# Patient Record
Sex: Female | Born: 1955 | Race: White | Hispanic: No | Marital: Single | Smoking: Former smoker
Health system: Southern US, Community
[De-identification: ages and names within clinical notes are randomized; demographics above are authoritative.]

## PROBLEM LIST (undated history)

## (undated) DIAGNOSIS — Z7902 Long term (current) use of antithrombotics/antiplatelets: Secondary | ICD-10-CM

## (undated) DIAGNOSIS — J441 Chronic obstructive pulmonary disease with (acute) exacerbation: Secondary | ICD-10-CM

## (undated) DIAGNOSIS — M810 Age-related osteoporosis without current pathological fracture: Secondary | ICD-10-CM

## (undated) DIAGNOSIS — N200 Calculus of kidney: Secondary | ICD-10-CM

## (undated) DIAGNOSIS — I10 Essential (primary) hypertension: Secondary | ICD-10-CM

## (undated) DIAGNOSIS — Z87442 Personal history of urinary calculi: Secondary | ICD-10-CM

## (undated) DIAGNOSIS — I779 Disorder of arteries and arterioles, unspecified: Secondary | ICD-10-CM

## (undated) DIAGNOSIS — F419 Anxiety disorder, unspecified: Secondary | ICD-10-CM

## (undated) DIAGNOSIS — Z9889 Other specified postprocedural states: Secondary | ICD-10-CM

## (undated) DIAGNOSIS — F32A Depression, unspecified: Secondary | ICD-10-CM

## (undated) DIAGNOSIS — R112 Nausea with vomiting, unspecified: Secondary | ICD-10-CM

## (undated) DIAGNOSIS — I341 Nonrheumatic mitral (valve) prolapse: Secondary | ICD-10-CM

## (undated) DIAGNOSIS — T7840XA Allergy, unspecified, initial encounter: Secondary | ICD-10-CM

## (undated) DIAGNOSIS — E782 Mixed hyperlipidemia: Secondary | ICD-10-CM

## (undated) DIAGNOSIS — M1990 Unspecified osteoarthritis, unspecified site: Secondary | ICD-10-CM

## (undated) HISTORY — PX: TONSILLECTOMY: SUR1361

## (undated) HISTORY — DX: Unspecified osteoarthritis, unspecified site: M19.90

## (undated) HISTORY — PX: CYSTOSCOPY WITH HOLMIUM LASER LITHOTRIPSY: SHX6639

## (undated) HISTORY — DX: Allergy, unspecified, initial encounter: T78.40XA

## (undated) HISTORY — PX: LITHOTRIPSY: SUR834

## (undated) HISTORY — PX: KIDNEY CYST REMOVAL: SHX684

## (undated) HISTORY — PX: STRABISMUS SURGERY: SHX218

---

## 1999-03-22 ENCOUNTER — Other Ambulatory Visit: Admission: RE | Admit: 1999-03-22 | Discharge: 1999-03-22 | Payer: Self-pay | Admitting: Family Medicine

## 2003-10-28 ENCOUNTER — Other Ambulatory Visit: Admission: RE | Admit: 2003-10-28 | Discharge: 2003-10-28 | Payer: Self-pay | Admitting: Internal Medicine

## 2013-12-19 DIAGNOSIS — K219 Gastro-esophageal reflux disease without esophagitis: Secondary | ICD-10-CM

## 2013-12-19 HISTORY — DX: Gastro-esophageal reflux disease without esophagitis: K21.9

## 2016-04-20 ENCOUNTER — Emergency Department
Admission: EM | Admit: 2016-04-20 | Discharge: 2016-04-20 | Disposition: A | Discharge status: Home / Self Care | Payer: No Typology Code available for payment source | Attending: Emergency Medicine | Admitting: Emergency Medicine

## 2016-04-20 ENCOUNTER — Emergency Department: Payer: No Typology Code available for payment source

## 2016-04-20 DIAGNOSIS — R58 Hemorrhage, not elsewhere classified: Secondary | ICD-10-CM | POA: Insufficient documentation

## 2016-04-20 DIAGNOSIS — S20219A Contusion of unspecified front wall of thorax, initial encounter: Secondary | ICD-10-CM | POA: Insufficient documentation

## 2016-04-20 DIAGNOSIS — I341 Nonrheumatic mitral (valve) prolapse: Secondary | ICD-10-CM | POA: Insufficient documentation

## 2016-04-20 DIAGNOSIS — Y92410 Unspecified street and highway as the place of occurrence of the external cause: Secondary | ICD-10-CM | POA: Insufficient documentation

## 2016-04-20 DIAGNOSIS — Y9389 Activity, other specified: Secondary | ICD-10-CM | POA: Diagnosis not present

## 2016-04-20 DIAGNOSIS — R0789 Other chest pain: Secondary | ICD-10-CM | POA: Diagnosis not present

## 2016-04-20 DIAGNOSIS — I10 Essential (primary) hypertension: Secondary | ICD-10-CM | POA: Diagnosis not present

## 2016-04-20 DIAGNOSIS — T148XXA Other injury of unspecified body region, initial encounter: Secondary | ICD-10-CM

## 2016-04-20 DIAGNOSIS — Y999 Unspecified external cause status: Secondary | ICD-10-CM | POA: Insufficient documentation

## 2016-04-20 DIAGNOSIS — F17210 Nicotine dependence, cigarettes, uncomplicated: Secondary | ICD-10-CM | POA: Diagnosis not present

## 2016-04-20 DIAGNOSIS — S2090XA Unspecified superficial injury of unspecified parts of thorax, initial encounter: Secondary | ICD-10-CM | POA: Diagnosis present

## 2016-04-20 HISTORY — DX: Calculus of kidney: N20.0

## 2016-04-20 HISTORY — DX: Nonrheumatic mitral (valve) prolapse: I34.1

## 2016-04-20 HISTORY — DX: Essential (primary) hypertension: I10

## 2016-04-20 LAB — BASIC METABOLIC PANEL
Anion gap: 11 (ref 5–15)
BUN: 12 mg/dL (ref 6–20)
CALCIUM: 8.7 mg/dL — AB (ref 8.9–10.3)
CO2: 22 mmol/L (ref 22–32)
CREATININE: 1.08 mg/dL — AB (ref 0.44–1.00)
Chloride: 103 mmol/L (ref 101–111)
GFR calc Af Amer: >60 mL/min (ref >60)
GFR, EST NON AFRICAN AMERICAN: 55 mL/min — AB (ref >60)
Glucose, Bld: 108 mg/dL — ABNORMAL HIGH (ref 65–99)
POTASSIUM: 4.1 mmol/L (ref 3.5–5.1)
SODIUM: 136 mmol/L (ref 135–145)

## 2016-04-20 LAB — CBC
HCT: 45.5 % (ref 35.0–47.0)
Hemoglobin: 15.1 g/dL (ref 12.0–16.0)
MCH: 29.8 pg (ref 26.0–34.0)
MCHC: 33.1 g/dL (ref 32.0–36.0)
MCV: 90.1 fL (ref 80.0–100.0)
PLATELETS: 244 10*3/uL (ref 150–440)
RBC: 5.05 MIL/uL (ref 3.80–5.20)
RDW: 14.5 % (ref 11.5–14.5)
WBC: 12.1 10*3/uL — AB (ref 3.6–11.0)

## 2016-04-20 LAB — TROPONIN I
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
## 2016-04-20 MED ORDER — TRAMADOL HCL 50 MG PO TABS
50.0000 mg | ORAL_TABLET | Freq: Once | ORAL | Status: AC
  Administered 2016-04-20: 50 mg via ORAL
  Filled 2016-04-20: qty 1

## 2016-04-20 MED ORDER — TRAMADOL HCL 50 MG PO TABS: 50.0000 mg | ORAL_TABLET | Freq: Four times a day (QID) | ORAL | Status: AC | PRN
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
## 2016-04-20 NOTE — Discharge Instructions (Signed)
Please seek medical attention for any high fevers, chest pain, shortness of breath, change in behavior, persistent vomiting, bloody stool or any other new or concerning symptoms. ° ° °Chest Wall Pain °Chest wall pain is pain in or around the bones and muscles of your chest. Sometimes, an injury causes this pain. Sometimes, the cause may not be known. This pain may take several weeks or longer to get better. °HOME CARE °Pay attention to any changes in your symptoms. Take these actions to help with your pain: °· Rest as told by your doctor. °· Avoid activities that cause pain. Try not to use your chest, belly (abdominal), or side muscles to lift heavy things. °· If directed, apply ice to the painful area: °¨ Put ice in a plastic bag. °¨ Place a towel between your skin and the bag. °¨ Leave the ice on for 20 minutes, 2-3 times per day. °· Take over-the-counter and prescription medicines only as told by your doctor. °· Do not use tobacco products, including cigarettes, chewing tobacco, and e-cigarettes. If you need help quitting, ask your doctor. °· Keep all follow-up visits as told by your doctor. This is important. °GET HELP IF: °· You have a fever. °· Your chest pain gets worse. °· You have new symptoms. °GET HELP RIGHT AWAY IF: °· You feel sick to your stomach (nauseous) or you throw up (vomit). °· You feel sweaty or light-headed. °· You have a cough with phlegm (sputum) or you cough up blood. °· You are short of breath. °  °This information is not intended to replace advice given to you by your health care provider. Make sure you discuss any questions you have with your health care provider. °  °Document Released: 05/23/2008 Document Revised: 08/26/2015 Document Reviewed: 03/02/2015 °Elsevier Interactive Patient Education ©2016 [employer] ° °

## 2017-05-17 ENCOUNTER — Observation Stay
Admission: EM | Admit: 2017-05-17 | Discharge: 2017-05-18 | Disposition: A | Discharge status: Home / Self Care | Payer: Self-pay | Attending: Internal Medicine | Admitting: Internal Medicine

## 2017-05-17 ENCOUNTER — Emergency Department: Payer: Self-pay

## 2017-05-17 ENCOUNTER — Encounter: Payer: Self-pay | Admitting: *Deleted

## 2017-05-17 DIAGNOSIS — Z87442 Personal history of urinary calculi: Secondary | ICD-10-CM | POA: Insufficient documentation

## 2017-05-17 DIAGNOSIS — K219 Gastro-esophageal reflux disease without esophagitis: Secondary | ICD-10-CM | POA: Insufficient documentation

## 2017-05-17 DIAGNOSIS — E876 Hypokalemia: Principal | ICD-10-CM | POA: Insufficient documentation

## 2017-05-17 DIAGNOSIS — Z79899 Other long term (current) drug therapy: Secondary | ICD-10-CM | POA: Insufficient documentation

## 2017-05-17 DIAGNOSIS — R1013 Epigastric pain: Secondary | ICD-10-CM

## 2017-05-17 DIAGNOSIS — R079 Chest pain, unspecified: Secondary | ICD-10-CM | POA: Diagnosis present

## 2017-05-17 DIAGNOSIS — I341 Nonrheumatic mitral (valve) prolapse: Secondary | ICD-10-CM | POA: Insufficient documentation

## 2017-05-17 DIAGNOSIS — I10 Essential (primary) hypertension: Secondary | ICD-10-CM | POA: Diagnosis present

## 2017-05-17 DIAGNOSIS — Z9981 Dependence on supplemental oxygen: Secondary | ICD-10-CM | POA: Insufficient documentation

## 2017-05-17 DIAGNOSIS — M546 Pain in thoracic spine: Secondary | ICD-10-CM | POA: Insufficient documentation

## 2017-05-17 LAB — CBC
HCT: 47.1 % — ABNORMAL HIGH (ref 35.0–47.0)
Hemoglobin: 15.5 g/dL (ref 12.0–16.0)
MCH: 30.8 pg (ref 26.0–34.0)
MCHC: 33 g/dL (ref 32.0–36.0)
MCV: 93.2 fL (ref 80.0–100.0)
PLATELETS: 289 10*3/uL (ref 150–440)
RBC: 5.05 MIL/uL (ref 3.80–5.20)
RDW: 15.6 % — AB (ref 11.5–14.5)
WBC: 12.2 10*3/uL — ABNORMAL HIGH (ref 3.6–11.0)

## 2017-05-17 LAB — HEPATIC FUNCTION PANEL
ALBUMIN: 4.4 g/dL (ref 3.5–5.0)
ALT: 18 U/L (ref 14–54)
AST: 19 U/L (ref 15–41)
Alkaline Phosphatase: 85 U/L (ref 38–126)
BILIRUBIN TOTAL: 0.8 mg/dL (ref 0.3–1.2)
Bilirubin, Direct: <0.1 mg/dL — ABNORMAL LOW (ref 0.1–0.5)
TOTAL PROTEIN: 7.7 g/dL (ref 6.5–8.1)

## 2017-05-17 LAB — BASIC METABOLIC PANEL
Anion gap: 11 (ref 5–15)
BUN: 10 mg/dL (ref 6–20)
CO2: 27 mmol/L (ref 22–32)
CREATININE: 1.04 mg/dL — AB (ref 0.44–1.00)
Calcium: 9.2 mg/dL (ref 8.9–10.3)
Chloride: 102 mmol/L (ref 101–111)
GFR calc Af Amer: >60 mL/min (ref >60)
GFR, EST NON AFRICAN AMERICAN: 58 mL/min — AB (ref >60)
Glucose, Bld: 108 mg/dL — ABNORMAL HIGH (ref 65–99)
Potassium: 3.3 mmol/L — ABNORMAL LOW (ref 3.5–5.1)
SODIUM: 140 mmol/L (ref 135–145)

## 2017-05-17 LAB — TROPONIN I: Troponin I: <0.03 ng/mL (ref <0.03)

## 2017-05-17 LAB — LIPASE, BLOOD: LIPASE: 28 U/L (ref 11–51)

## 2017-05-17 LAB — LACTIC ACID, PLASMA: LACTIC ACID, VENOUS: 1.4 mmol/L (ref 0.5–1.9)

## 2017-05-17 IMAGING — CT CT ANGIO CHEST-ABD-PELV FOR DISSECTION W/ AND WO/W CM
2 of 7 series · 15 of 46 positions shown, 17 images · IV contrast (APPLIED)
Comparison: None.

CLINICAL DATA: Upper back pain going through mid back.

EXAM:
CT ANGIOGRAPHY CHEST, ABDOMEN AND PELVIS
TECHNIQUE: Multidetector CT imaging through the chest, abdomen and pelvis was
performed using the standard protocol during bolus administration of
intravenous contrast. Multiplanar reconstructed images and MIPs were
obtained and reviewed to evaluate the vascular anatomy.
CONTRAST:  Isovue 370, 100 mL.

[Series 5: axial arterial · axial · arterial · 0.66mm/px · z∈[-885,-339]mm · 12 of 204 slices shown, 14 images]
[im 11/204  soft-tissue]
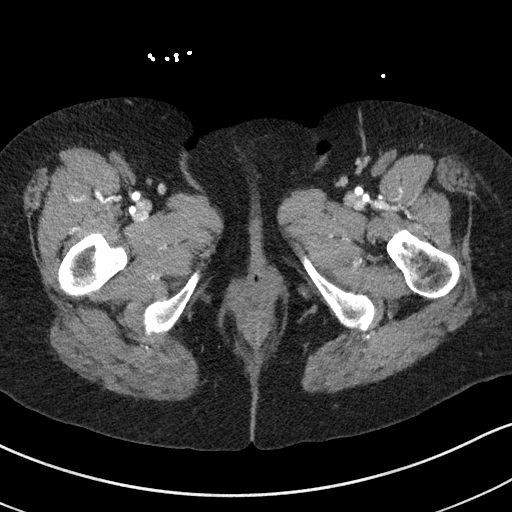
[im 11/204  bone]
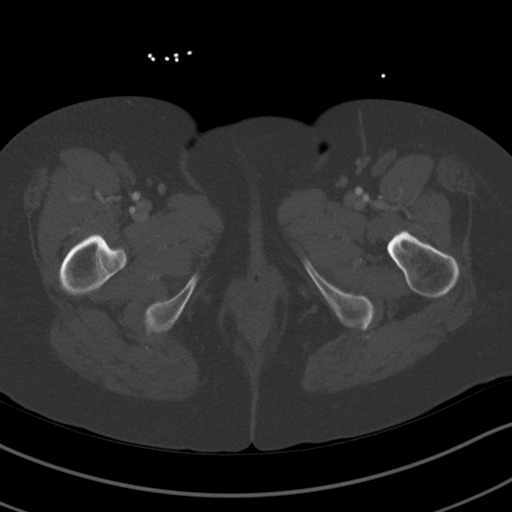
[im 33/204  soft-tissue]
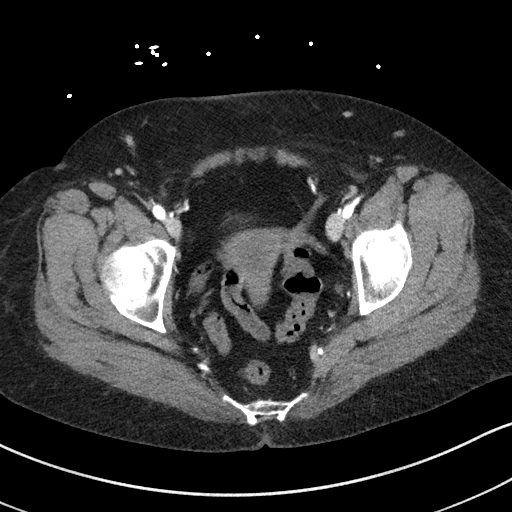
[im 43/204  soft-tissue]
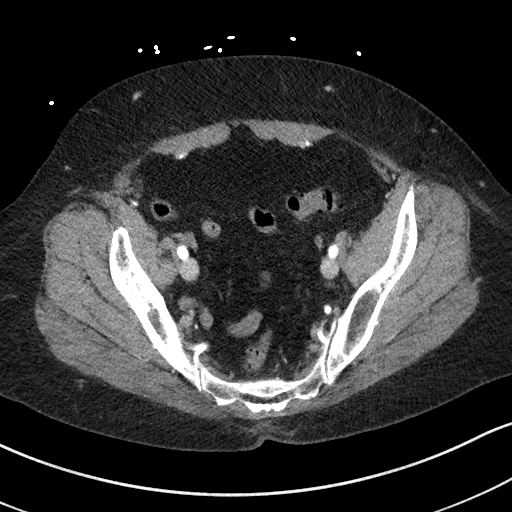
[im 65/204  soft-tissue]
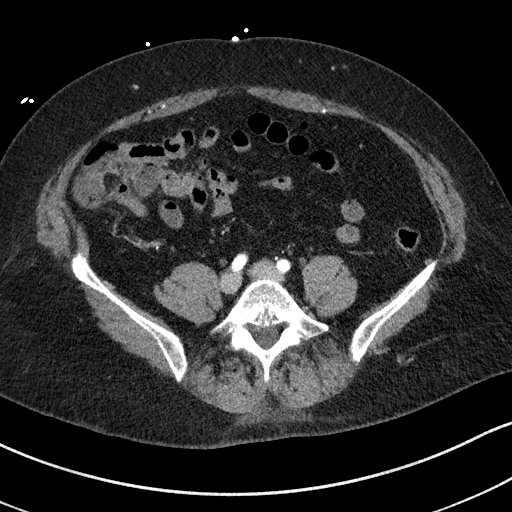
[im 75/204  soft-tissue]
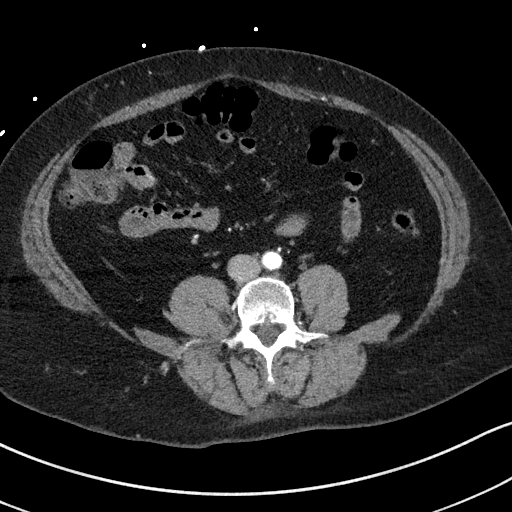
[im 97/204  soft-tissue]
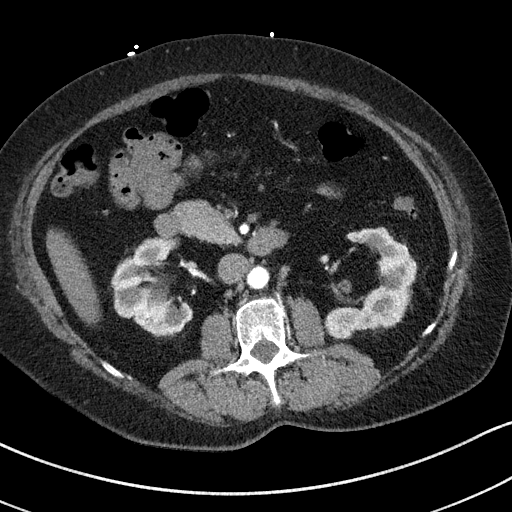
[im 107/204  soft-tissue]
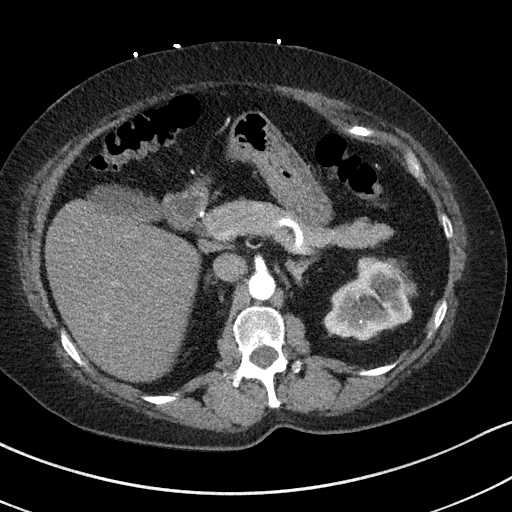
[im 129/204  soft-tissue]
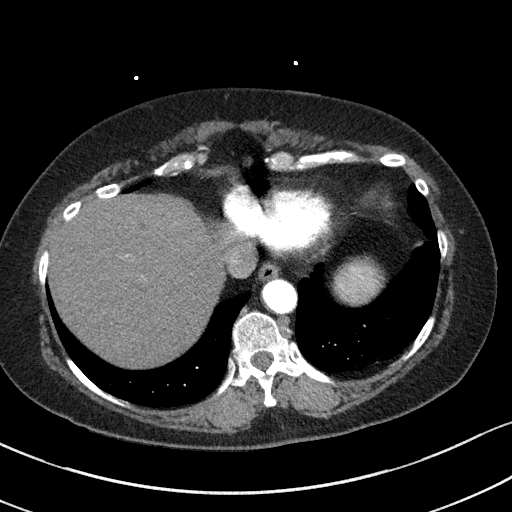
[im 139/204  soft-tissue]
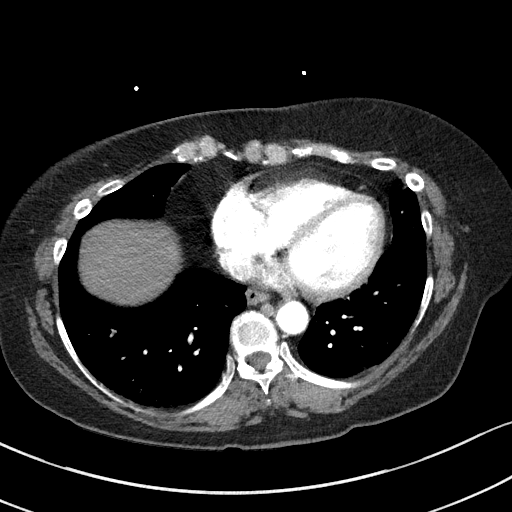
[im 139/204  bone]
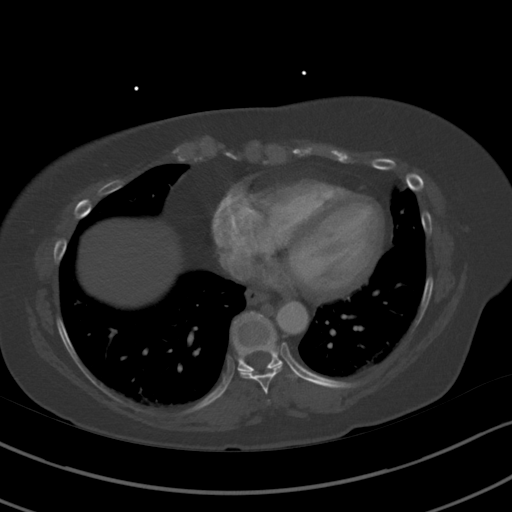
[im 161/204  soft-tissue]
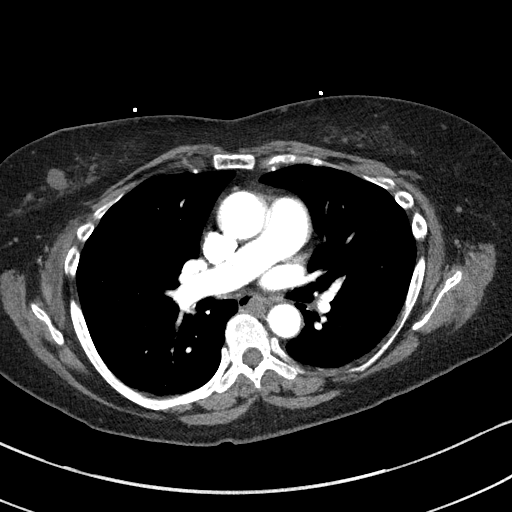
[im 171/204  soft-tissue]
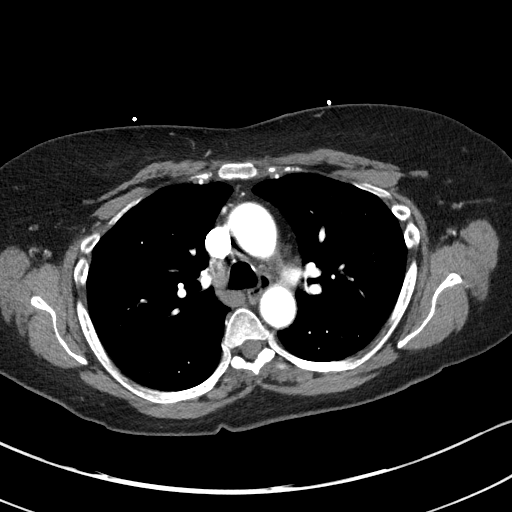
[im 193/204  soft-tissue]
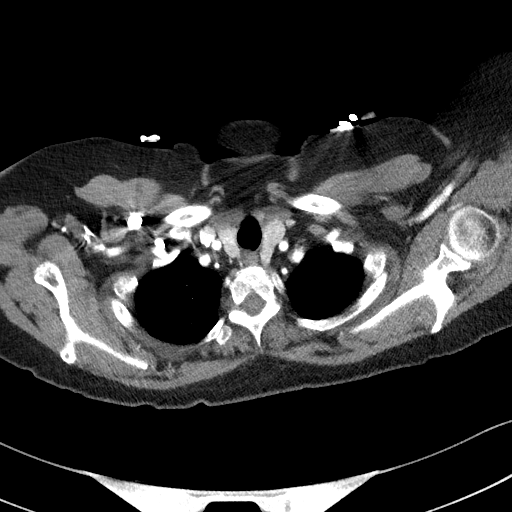

[Series 7: coronals · coronal · 0.88mm/px · 3 of 126 slices shown]
[im 32/126  soft-tissue]
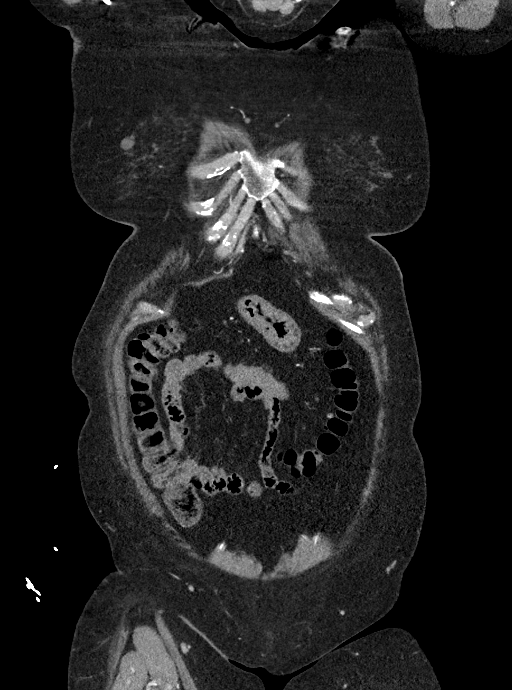
[im 63/126  soft-tissue]
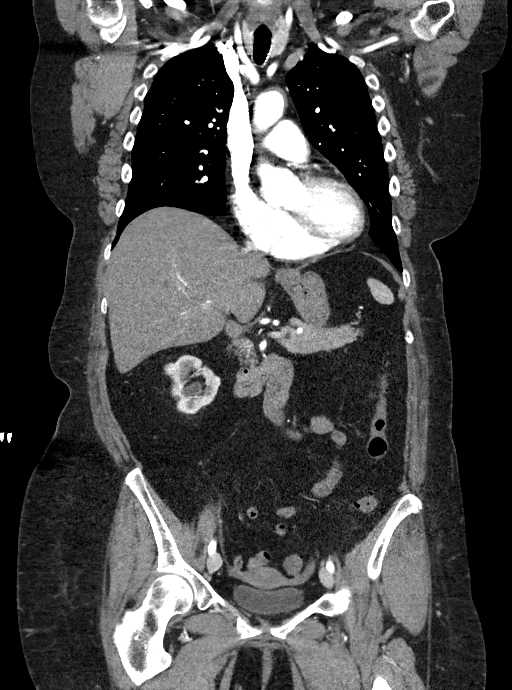
[im 94/126  soft-tissue]
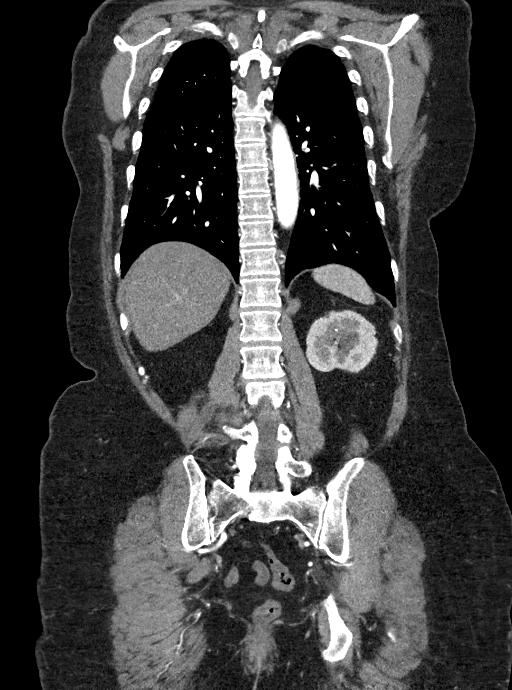

[15 of 46 positions shown; findings below may reference images not displayed]

FINDINGS: CTA CHEST FINDINGS

Cardiovascular: Preferential opacification of the thoracic aorta. No
evidence of thoracic aortic aneurysm or dissection. Normal heart
size. No pericardial effusion.

Mediastinum/Nodes: No enlarged mediastinal, hilar, or axillary lymph
nodes. Thyroid gland, trachea, and esophagus demonstrate no
significant findings.

Lungs/Pleura: Lungs are clear. No pleural effusion or pneumothorax.

Musculoskeletal: No chest wall abnormality. No acute or significant
osseous findings.

Review of the MIP images confirms the above findings.

CTA ABDOMEN AND PELVIS FINDINGS

VASCULAR

Aorta: Normal caliber aorta without aneurysm, dissection, vasculitis
or significant stenosis. Moderate atheromatous change in the
infrarenal aorta.

Celiac: Patent without evidence of aneurysm, dissection, vasculitis
or significant stenosis.

SMA: Patent without evidence of aneurysm, dissection, vasculitis or
significant stenosis.

Renals: Both renal arteries are patent without evidence of aneurysm,
dissection, vasculitis, fibromuscular dysplasia or significant
stenosis.

IMA: High-grade stenosis at its origin, see image 118 series 5.
Moderate irregularity but patency distally.

Inflow: Patent without evidence of aneurysm, dissection, vasculitis
or significant stenosis.

Veins: No obvious venous abnormality within the limitations of this
arterial phase study.

Review of the MIP images confirms the above findings.

NON-VASCULAR

Hepatobiliary: No focal liver abnormality is seen. No gallstones,
gallbladder wall thickening, or biliary dilatation.

Pancreas: Unremarkable. No pancreatic ductal dilatation or
surrounding inflammatory changes.

Spleen: Normal in size without focal abnormality.

Adrenals/Urinary Tract: Normal adrenal glands. Kidneys demonstrate
good nephrogram phase, but are small and shrunken. BILATERAL
nonobstructing renal calculi are observed. Scattered parapelvic
cysts.

Stomach/Bowel: Stomach is within normal limits. Appendix appears
normal. No evidence of bowel wall thickening, distention, or
inflammatory changes.

Lymphatic: No significant vascular findings are present. No enlarged
abdominal or pelvic lymph nodes.

Reproductive: Uterus and bilateral adnexa are unremarkable.

Other: No abdominopelvic ascites. Small BILATERAL inguinal hernias
containing only fat.

Musculoskeletal: No acute or significant osseous findings.

Review of the MIP images confirms the above findings.
IMPRESSION: No evidence for aortic dissection or significant
brachiocephalic/large vessel visceral stenosis or occlusion.

High-grade stenosis of the IMA, but distal patency established.

BILATERAL renal atrophy, with nephrolithiasis. No proximal renal
artery stenosis is evident.

Aortic atherosclerosis.

## 2017-05-17 MED ORDER — IOPAMIDOL (ISOVUE-370) INJECTION 76%
100.0000 mL | Freq: Once | INTRAVENOUS | Status: AC | PRN
  Administered 2017-05-17: 100 mL via INTRAVENOUS

## 2017-05-17 MED ORDER — ASPIRIN 81 MG PO CHEW
324.0000 mg | CHEWABLE_TABLET | Freq: Once | ORAL | Status: AC
  Administered 2017-05-17: 324 mg via ORAL
  Filled 2017-05-17: qty 4

## 2017-05-17 MED ORDER — ONDANSETRON HCL 4 MG PO TABS: 4.0000 mg | ORAL_TABLET | Freq: Four times a day (QID) | ORAL | Status: DC | PRN

## 2017-05-17 MED ORDER — ENOXAPARIN SODIUM 40 MG/0.4ML ~~LOC~~ SOLN: 40.0000 mg | SUBCUTANEOUS | Status: DC

## 2017-05-17 MED ORDER — ONDANSETRON HCL 4 MG/2ML IJ SOLN: 4.0000 mg | Freq: Four times a day (QID) | INTRAMUSCULAR | Status: DC | PRN

## 2017-05-17 MED ORDER — POTASSIUM CHLORIDE CRYS ER 20 MEQ PO TBCR
40.0000 meq | EXTENDED_RELEASE_TABLET | Freq: Once | ORAL | Status: AC
  Administered 2017-05-17: 40 meq via ORAL
  Filled 2017-05-17: qty 2

## 2017-05-17 MED ORDER — ATENOLOL 25 MG PO TABS
50.0000 mg | ORAL_TABLET | Freq: Every day | ORAL | Status: DC
  Administered 2017-05-18: 50 mg via ORAL
  Filled 2017-05-17: qty 2

## 2017-05-17 MED ORDER — ACETAMINOPHEN 650 MG RE SUPP: 650.0000 mg | Freq: Four times a day (QID) | RECTAL | Status: DC | PRN

## 2017-05-17 MED ORDER — LABETALOL HCL 5 MG/ML IV SOLN
10.0000 mg | Freq: Once | INTRAVENOUS | Status: AC
  Administered 2017-05-17: 10 mg via INTRAVENOUS
  Filled 2017-05-17: qty 4

## 2017-05-17 MED ORDER — ACETAMINOPHEN 325 MG PO TABS
650.0000 mg | ORAL_TABLET | Freq: Four times a day (QID) | ORAL | Status: DC | PRN
  Administered 2017-05-18: 650 mg via ORAL
  Filled 2017-05-17: qty 2

## 2017-05-17 MED ORDER — SODIUM CHLORIDE 0.9 % IV BOLUS (SEPSIS)
1000.0000 mL | Freq: Once | INTRAVENOUS | Status: AC
  Administered 2017-05-17: 1000 mL via INTRAVENOUS

## 2017-05-17 MED ORDER — LABETALOL HCL 5 MG/ML IV SOLN: 10.0000 mg | INTRAVENOUS | Status: DC | PRN

## 2017-05-17 MED ORDER — HYDRALAZINE HCL 20 MG/ML IJ SOLN
10.0000 mg | INTRAMUSCULAR | Status: DC | PRN
Start: 2017-05-17 — End: 2017-05-18

## 2017-05-17 MED ORDER — LORAZEPAM 2 MG/ML IJ SOLN
0.5000 mg | Freq: Once | INTRAMUSCULAR | Status: AC
  Administered 2017-05-17: 0.5 mg via INTRAVENOUS
  Filled 2017-05-17: qty 1

## 2017-05-17 NOTE — ED Provider Notes (Signed)
[facility] Emergency Department Provider Note  ____________________________________________  Time seen: Approximately 7:48 PM  I have reviewed the triage vital signs and the nursing notes.   HISTORY  Chief Complaint Abdominal Pain    HPI Rachel Hahn is a 61 y.o. female with a history of hypertension and mitral valve prolapse presenting with epigastric and chest pain as well as back pain. The patient reports that she has been out of her antihypertensive for several weeks. Last night, she was sitting at home when she developed a pressure sensation in the epigastrium in the chest that radiated straight to the back with shortness of breath. She has had recurrent episodes that last for several minutes and then resolve spontaneously since then. Occasionally, she has the sensation that she needs to defecate, and has had some mild diarrhea, but this has not alleviated her pain. She went to work today and continued to have more episodes. She denies any lightheadedness or syncope, palpitations, diaphoresis, and nausea or vomiting.No fever or chills. On arrival to the ED, the patient's blood pressure is 219/119 with a heart rate of 1:30.   Past Medical History:  Diagnosis Date  . Hypertension   . Kidney stones   . Mitral valve prolapse     There are no active problems to display for this patient.   Past Surgical History:  Procedure Laterality Date  . KIDNEY CYST REMOVAL    . LITHOTRIPSY    . TONSILLECTOMY        Allergies Codeine; Ibuprofen; Morphine and related; and Sudafed [pseudoephedrine hcl]  History reviewed. No pertinent family history.  Social History Social History  Substance Use Topics  . Smoking status: Current Every Day Smoker    Packs/day: 0.50    Years: 20.00    Types: Cigarettes  . Smokeless tobacco: Never Used  . Alcohol use No    Review of Systems Constitutional: No fever/chills. No lightheadedness or syncope. Eyes: No visual  changes. ENT: No sore throat. No congestion or rhinorrhea. Cardiovascular: + chest pain. Denies palpitations. Respiratory: + shortness of breath.  No cough. Gastrointestinal: + epigastric abdominal pain.  No nausea, no vomiting.  + diarrhea.  No constipation. Genitourinary: Negative for dysuria. Musculoskeletal: + for back pain. Skin: Negative for rash. Neurological: Negative for headaches. No focal numbness, tingling or weakness.   10-point ROS otherwise negative.  ____________________________________________   PHYSICAL EXAM:  VITAL SIGNS: ED Triage Vitals  Enc Vitals Group     BP 05/17/17 1921 (!) 219/119     Pulse Rate 05/17/17 1921 (!) 127     Resp 05/17/17 1921 18     Temp 05/17/17 1921 98.4 F (36.9 C)     Temp Source 05/17/17 1921 Oral     SpO2 05/17/17 1921 97 %     Weight 05/17/17 1923 158 lb (71.7 kg)     Height 05/17/17 1923 5\' 1"  (1.549 m)     Head Circumference --      Peak Flow --      Pain Score 05/17/17 1921 0     Pain Loc --      Pain Edu? --      Excl. in GC? --     Constitutional: Alert and oriented. Well appearing and in no acute distress. Answers questions appropriately. Eyes: Conjunctivae are normal.  EOMI. No scleral icterus. Disconjugate gaze. Head: Atraumatic. Nose: No congestion/rhinnorhea. Mouth/Throat: Mucous membranes are moist.  Neck: No stridor.  Supple.  No JVD. Cardiovascular: Fast rate, regular rhythm.  No murmurs, rubs or gallops.  Respiratory: Normal respiratory effort.  No accessory muscle use or retractions. Lungs CTAB.  No wheezes, rales or ronchi. Gastrointestinal: Soft, and nondistended.  Minimal ttp in the epigastrium.  Neg Murphy's sign. No guarding or rebound.  No peritoneal signs. Musculoskeletal: No LE edema. No ttp in the calves or palpable cords.  Negative Homan's sign. Neurologic:  A&Ox3.  Speech is clear.  Face and smile are symmetric.  EOMI.  Moves all extremities well. Skin:  Skin is warm, dry and intact. No rash  noted. Psychiatric: Mood and affect are normal. Speech and behavior are normal.  Normal judgement.  ____________________________________________   LABS (all labs ordered are listed, but only abnormal results are displayed)  Labs Reviewed  BASIC METABOLIC PANEL - Abnormal; Notable for the following:       Result Value   Potassium 3.3 (*)    Glucose, Bld 108 (*)    Creatinine, Ser 1.04 (*)    GFR calc non Af Amer 58 (*)    All other components within normal limits  CBC - Abnormal; Notable for the following:    WBC 12.2 (*)    HCT 47.1 (*)    RDW 15.6 (*)    All other components within normal limits  HEPATIC FUNCTION PANEL - Abnormal; Notable for the following:    Bilirubin, Direct <0.1 (*)    All other components within normal limits  TROPONIN I  LIPASE, BLOOD  LACTIC ACID, PLASMA  LACTIC ACID, PLASMA   ____________________________________________  EKG  ED ECG REPORT I, Rockne Menghini, the attending physician, personally viewed and interpreted this ECG.   Date: 05/17/2017  EKG Time: 1925  Rate: 127  Rhythm:sinus tachycardia  Axis: normal  Intervals:none  ST&T Change: No STEMI but the patient has 0.5 mm ST depression in V3 and V4.  ____________________________________________  RADIOLOGY  Ct Angio Chest/abd/pel For Dissection W And/or W/wo  Result Date: 05/17/2017 CLINICAL DATA:  Upper back pain going through mid back. EXAM: CT ANGIOGRAPHY CHEST, ABDOMEN AND PELVIS TECHNIQUE: Multidetector CT imaging through the chest, abdomen and pelvis was performed using the standard protocol during bolus administration of intravenous contrast. Multiplanar reconstructed images and MIPs were obtained and reviewed to evaluate the vascular anatomy. CONTRAST:  Isovue 370, 100 mL. COMPARISON:  None. FINDINGS: CTA CHEST FINDINGS Cardiovascular: Preferential opacification of the thoracic aorta. No evidence of thoracic aortic aneurysm or dissection. Normal heart size. No pericardial  effusion. Mediastinum/Nodes: No enlarged mediastinal, hilar, or axillary lymph nodes. Thyroid gland, trachea, and esophagus demonstrate no significant findings. Lungs/Pleura: Lungs are clear. No pleural effusion or pneumothorax. Musculoskeletal: No chest wall abnormality. No acute or significant osseous findings. Review of the MIP images confirms the above findings. CTA ABDOMEN AND PELVIS FINDINGS VASCULAR Aorta: Normal caliber aorta without aneurysm, dissection, vasculitis or significant stenosis. Moderate atheromatous change in the infrarenal aorta. Celiac: Patent without evidence of aneurysm, dissection, vasculitis or significant stenosis. SMA: Patent without evidence of aneurysm, dissection, vasculitis or significant stenosis. Renals: Both renal arteries are patent without evidence of aneurysm, dissection, vasculitis, fibromuscular dysplasia or significant stenosis. IMA: High-grade stenosis at its origin, see image 118 series 5. Moderate irregularity but patency distally. Inflow: Patent without evidence of aneurysm, dissection, vasculitis or significant stenosis. Veins: No obvious venous abnormality within the limitations of this arterial phase study. Review of the MIP images confirms the above findings. NON-VASCULAR Hepatobiliary: No focal liver abnormality is seen. No gallstones, gallbladder wall thickening, or biliary dilatation. Pancreas: Unremarkable. No pancreatic  ductal dilatation or surrounding inflammatory changes. Spleen: Normal in size without focal abnormality. Adrenals/Urinary Tract: Normal adrenal glands. Kidneys demonstrate good nephrogram phase, but are small and shrunken. BILATERAL nonobstructing renal calculi are observed. Scattered parapelvic cysts. Stomach/Bowel: Stomach is within normal limits. Appendix appears normal. No evidence of bowel wall thickening, distention, or inflammatory changes. Lymphatic: No significant vascular findings are present. No enlarged abdominal or pelvic lymph  nodes. Reproductive: Uterus and bilateral adnexa are unremarkable. Other: No abdominopelvic ascites. Small BILATERAL inguinal hernias containing only fat. Musculoskeletal: No acute or significant osseous findings. Review of the MIP images confirms the above findings. IMPRESSION: No evidence for aortic dissection or significant brachiocephalic/large vessel visceral stenosis or occlusion. High-grade stenosis of the IMA, but distal patency established. BILATERAL renal atrophy, with nephrolithiasis. No proximal renal artery stenosis is evident. Aortic atherosclerosis. Electronically Signed   By: Elsie Stain M.D.   On: 05/17/2017 21:16    ____________________________________________   PROCEDURES  Procedure(s) performed: None  Procedures  Critical Care performed: Yes ____________________________________________   INITIAL IMPRESSION / ASSESSMENT AND PLAN / ED COURSE  Pertinent labs & imaging results that were available during my care of the patient were reviewed by me and considered in my medical decision making (see chart for details).  61 y.o. female with a history of hypertension not on her medications presenting markedly hypertensive in the setting of epigastric and chest pressure radiating to the back. I will get a CT angiogram of the chest abdomen and pelvis to rule out aortic pathology including dissection. I would also consider ACS or MI and a troponin is pending, however anticoagulation or aspirin therapy is not indicated until the CT angiogram has ruled out aortic pathology. PE is much less likely. Consider GI causes including gastric ulcer disease, peptic ulcer disease, pancreatitis, or less likely perforation. Plan reevaluation for final disposition.  ----------------------------------------- 9:05 PM on 05/17/2017 -----------------------------------------  The patient's blood pressure went as high as systolic of 240. The labetalol has been improving her blood pressure and she is now  at 171/98. I'm awaiting the results of her CT.  ----------------------------------------- 9:52 PM on 05/17/2017 -----------------------------------------  The patient's blood pressure continues to improve and is now 154/86 with treatment with antihypertensive's as well as anti-anxiolytics. Her CT scan is reassuring without any aortic pathology. There is a high-grade stenosis at the IMA but distal patency is seen on CT; I have discussed the setting with Dr. due, feels it does not report the cause of her pain. The patient's troponin is negative, but given the severity of both her pain as well as her extreme hypertension, we'll admit her to the hospital for further cardiac evaluation and treatment.  An aspirin has been ordered.  CRITICAL CARE Performed by: Rockne Menghini   Total critical care time: 45 minutes  Critical care time was exclusive of separately billable procedures and treating other patients.  Critical care was necessary to treat or prevent imminent or life-threatening deterioration.  Critical care was time spent personally by me on the following activities: development of treatment plan with patient and/or surrogate as well as nursing, discussions with consultants, evaluation of patient's response to treatment, examination of patient, obtaining history from patient or surrogate, ordering and performing treatments and interventions, ordering and review of laboratory studies, ordering and review of radiographic studies, pulse oximetry and re-evaluation of patient's condition.  ____________________________________________  FINAL CLINICAL IMPRESSION(S) / ED DIAGNOSES  Final diagnoses:  Chest pain  Epigastric pain  Acute midline thoracic back pain  Hypertension,  unspecified type         NEW MEDICATIONS STARTED DURING THIS VISIT:  New Prescriptions   No medications on file      Rockne Menghini, MD 05/17/17 2153

## 2017-05-17 NOTE — ED Notes (Signed)
Pt assisted up to commode for urination. 

## 2017-05-17 NOTE — H&P (Signed)
[facility] - [place] at [facility]   PATIENT NAME: Rachel Hahn    MR#:  [id number]  DATE OF BIRTH:  July 28, 1956  DATE OF ADMISSION:  05/17/2017  PRIMARY CARE PHYSICIAN: [facility]   REQUESTING/REFERRING PHYSICIAN: Sharma Covert, MD  CHIEF COMPLAINT:   Chief Complaint  Patient presents with  . Abdominal Pain    HISTORY OF PRESENT ILLNESS:  Rachel Hahn  is a 61 y.o. female who presents with Epigastric abdominal pain. Patient presented with significantly elevated blood pressure that required intervention with IV medications in the ED. She states that she has had this epigastric abdominal pain today which radiates through to her back. She describes the pain as waxing and waning, but persistent. She had one episode of diarrhea this morning, no nausea or vomiting. She denies any prior cardiac problems. She does have a history of reflux and remote history of peptic ulcer. Initial workup in the ED was largely within normal limits. Hospitalists were called for admission for further evaluation.  PAST MEDICAL HISTORY:   Past Medical History:  Diagnosis Date  . Hypertension   . Kidney stones   . Mitral valve prolapse     PAST SURGICAL HISTORY:   Past Surgical History:  Procedure Laterality Date  . KIDNEY CYST REMOVAL    . LITHOTRIPSY    . TONSILLECTOMY      SOCIAL HISTORY:   Social History  Substance Use Topics  . Smoking status: Current Every Day Smoker    Packs/day: 0.50    Years: 20.00    Types: Cigarettes  . Smokeless tobacco: Never Used  . Alcohol use No    FAMILY HISTORY:   Family History  Problem Relation Age of Onset  . Hypertension Other     DRUG ALLERGIES:   Allergies  Allergen Reactions  . Nitrofurantoin Hives  . Nsaids Shortness Of Breath  . Codeine Nausea And Vomiting  . Ibuprofen Other (See Comments)    Shortness of breath/chest pain  . Morphine And Related Nausea And Vomiting  . Sudafed  [Pseudoephedrine Hcl] Other (See Comments)    Chest pain/shortness of breath    MEDICATIONS AT HOME:   Prior to Admission medications   Medication Sig Start Date End Date Taking? Authorizing Provider  atenolol (TENORMIN) 50 MG tablet Take 50 mg by mouth daily.   Yes [provider]    REVIEW OF SYSTEMS:  Review of Systems  Constitutional: Negative for chills, fever, malaise/fatigue and weight loss.  HENT: Negative for ear pain, hearing loss and tinnitus.   Eyes: Negative for blurred vision, double vision, pain and redness.  Respiratory: Negative for cough, hemoptysis and shortness of breath.   Cardiovascular: Negative for chest pain, palpitations, orthopnea and leg swelling.  Gastrointestinal: Positive for abdominal pain and diarrhea. Negative for constipation, nausea and vomiting.  Genitourinary: Negative for dysuria, frequency and hematuria.  Musculoskeletal: Negative for back pain, joint pain and neck pain.  Skin:       No acne, rash, or lesions  Neurological: Negative for dizziness, tremors, focal weakness and weakness.  Endo/Heme/Allergies: Negative for polydipsia. Does not bruise/bleed easily.  Psychiatric/Behavioral: Negative for depression. The patient is not nervous/anxious and does not have insomnia.      VITAL SIGNS:   Vitals:   05/17/17 2015 05/17/17 2030 05/17/17 2054 05/17/17 2130  BP:  (!) 171/98 (!) 171/98 (!) 154/86  Pulse: (!) 110 (!) 110  (!) 109  Resp: 14 19  14   Temp:  TempSrc:      SpO2: 95% 93%  93%  Weight:      Height:       Wt Readings from Last 3 Encounters:  05/17/17 71.7 kg (158 lb)  04/20/16 65.8 kg (145 lb)    PHYSICAL EXAMINATION:  Physical Exam  Vitals reviewed. Constitutional: She is oriented to person, place, and time. She appears well-developed and well-nourished. No distress.  HENT:  Head: Normocephalic and atraumatic.  Mouth/Throat: Oropharynx is clear and moist.  Eyes: Conjunctivae and EOM are normal. Pupils are  equal, round, and reactive to light. No scleral icterus.  Neck: Normal range of motion. Neck supple. No JVD present. No thyromegaly present.  Cardiovascular: Normal rate, regular rhythm and intact distal pulses.  Exam reveals no gallop and no friction rub.   No murmur heard. Respiratory: Effort normal and breath sounds normal. No respiratory distress. She has no wheezes. She has no rales.  GI: Soft. Bowel sounds are normal. She exhibits no distension. There is tenderness (Epigastric).  Musculoskeletal: Normal range of motion. She exhibits no edema.  No arthritis, no gout  Lymphadenopathy:    She has no cervical adenopathy.  Neurological: She is alert and oriented to person, place, and time. No cranial nerve deficit.  No dysarthria, no aphasia  Skin: Skin is warm and dry. No rash noted. No erythema.  Psychiatric: She has a normal mood and affect. Her behavior is normal. Judgment and thought content normal.    LABORATORY PANEL:   CBC  Recent Labs Lab 05/17/17 1944  WBC 12.2*  HGB 15.5  HCT 47.1*  PLT 289   ------------------------------------------------------------------------------------------------------------------  Chemistries   Recent Labs Lab 05/17/17 1944  NA 140  K 3.3*  CL 102  CO2 27  GLUCOSE 108*  BUN 10  CREATININE 1.04*  CALCIUM 9.2  AST 19  ALT 18  ALKPHOS 85  BILITOT 0.8   ------------------------------------------------------------------------------------------------------------------  Cardiac Enzymes  Recent Labs Lab 05/17/17 1944  TROPONINI <0.03   ------------------------------------------------------------------------------------------------------------------  RADIOLOGY:  Ct Angio Chest/abd/pel For Dissection W And/or W/wo  Result Date: 05/17/2017 CLINICAL DATA:  Upper back pain going through mid back. EXAM: CT ANGIOGRAPHY CHEST, ABDOMEN AND PELVIS TECHNIQUE: Multidetector CT imaging through the chest, abdomen and pelvis was performed  using the standard protocol during bolus administration of intravenous contrast. Multiplanar reconstructed images and MIPs were obtained and reviewed to evaluate the vascular anatomy. CONTRAST:  Isovue 370, 100 mL. COMPARISON:  None. FINDINGS: CTA CHEST FINDINGS Cardiovascular: Preferential opacification of the thoracic aorta. No evidence of thoracic aortic aneurysm or dissection. Normal heart size. No pericardial effusion. Mediastinum/Nodes: No enlarged mediastinal, hilar, or axillary lymph nodes. Thyroid gland, trachea, and esophagus demonstrate no significant findings. Lungs/Pleura: Lungs are clear. No pleural effusion or pneumothorax. Musculoskeletal: No chest wall abnormality. No acute or significant osseous findings. Review of the MIP images confirms the above findings. CTA ABDOMEN AND PELVIS FINDINGS VASCULAR Aorta: Normal caliber aorta without aneurysm, dissection, vasculitis or significant stenosis. Moderate atheromatous change in the infrarenal aorta. Celiac: Patent without evidence of aneurysm, dissection, vasculitis or significant stenosis. SMA: Patent without evidence of aneurysm, dissection, vasculitis or significant stenosis. Renals: Both renal arteries are patent without evidence of aneurysm, dissection, vasculitis, fibromuscular dysplasia or significant stenosis. IMA: High-grade stenosis at its origin, see image 118 series 5. Moderate irregularity but patency distally. Inflow: Patent without evidence of aneurysm, dissection, vasculitis or significant stenosis. Veins: No obvious venous abnormality within the limitations of this arterial phase study. Review of the MIP images  confirms the above findings. NON-VASCULAR Hepatobiliary: No focal liver abnormality is seen. No gallstones, gallbladder wall thickening, or biliary dilatation. Pancreas: Unremarkable. No pancreatic ductal dilatation or surrounding inflammatory changes. Spleen: Normal in size without focal abnormality. Adrenals/Urinary Tract:  Normal adrenal glands. Kidneys demonstrate good nephrogram phase, but are small and shrunken. BILATERAL nonobstructing renal calculi are observed. Scattered parapelvic cysts. Stomach/Bowel: Stomach is within normal limits. Appendix appears normal. No evidence of bowel wall thickening, distention, or inflammatory changes. Lymphatic: No significant vascular findings are present. No enlarged abdominal or pelvic lymph nodes. Reproductive: Uterus and bilateral adnexa are unremarkable. Other: No abdominopelvic ascites. Small BILATERAL inguinal hernias containing only fat. Musculoskeletal: No acute or significant osseous findings. Review of the MIP images confirms the above findings. IMPRESSION: No evidence for aortic dissection or significant brachiocephalic/large vessel visceral stenosis or occlusion. High-grade stenosis of the IMA, but distal patency established. BILATERAL renal atrophy, with nephrolithiasis. No proximal renal artery stenosis is evident. Aortic atherosclerosis. Electronically Signed   By: Elsie Stain M.D.   On: 05/17/2017 21:16    EKG:   Orders placed or performed during the hospital encounter of 05/17/17  . ED EKG within 10 minutes  . ED EKG within 10 minutes  . EKG 12-Lead  . EKG 12-Lead    IMPRESSION AND PLAN:  Principal Problem:   Accelerated hypertension - continue home meds, additional when necessary antihypertensive medications keep blood pressure less than 160/100. Active Problems:   Chest pain - initial cardiac enzyme negative, serial troponins tonight, echocardiogram in the morning along with cardiology consult.  All the records are reviewed and case discussed with ED provider. Management plans discussed with the patient and/or family.  DVT PROPHYLAXIS: SubQ lovenox  GI PROPHYLAXIS: None  ADMISSION STATUS: Observation  CODE STATUS: Full Code Status History    This patient does not have a recorded code status. Please follow your organizational policy for patients  in this situation.      TOTAL TIME TAKING CARE OF THIS PATIENT: 40 minutes.   Anaissa Macfadden FIELDING 05/17/2017, 10:28 PM  [facility]  Office  [phone]  CC: Primary care physician; [facility]  Note:  This document was prepared using Conservation officer, historic buildings and may include unintentional dictation errors.

## 2017-05-17 NOTE — ED Notes (Signed)
hospitalist in to see pt.

## 2017-05-17 NOTE — ED Triage Notes (Signed)
Pt reports upper abd pain going thru to mid back.   Pt denies chest pain or sob.  Pt worked all day and states pain is worse.  No n/v/  Diarrhea x4.  Pt alert.  Speech clear.

## 2017-05-17 NOTE — ED Notes (Signed)
Pt assisted up to commode to void. Pt is less anxious.

## 2017-05-17 NOTE — ED Notes (Signed)
Pt assisted up to commode. 

## 2017-05-17 NOTE — ED Notes (Signed)
Patient transported to CT 

## 2017-05-17 NOTE — ED Notes (Signed)
Pt updated on ct process. Pr verbalizes understanding with multiple explanation reinforcement.

## 2017-05-18 LAB — BASIC METABOLIC PANEL
ANION GAP: 6 (ref 5–15)
BUN: 10 mg/dL (ref 6–20)
CHLORIDE: 106 mmol/L (ref 101–111)
CO2: 28 mmol/L (ref 22–32)
Calcium: 8.3 mg/dL — ABNORMAL LOW (ref 8.9–10.3)
Creatinine, Ser: 0.96 mg/dL (ref 0.44–1.00)
GFR calc non Af Amer: >60 mL/min (ref >60)
Glucose, Bld: 108 mg/dL — ABNORMAL HIGH (ref 65–99)
POTASSIUM: 3.5 mmol/L (ref 3.5–5.1)
Sodium: 140 mmol/L (ref 135–145)

## 2017-05-18 LAB — CBC
HCT: 41 % (ref 35.0–47.0)
HEMOGLOBIN: 13.9 g/dL (ref 12.0–16.0)
MCH: 31.4 pg (ref 26.0–34.0)
MCHC: 33.8 g/dL (ref 32.0–36.0)
MCV: 93 fL (ref 80.0–100.0)
Platelets: 235 10*3/uL (ref 150–440)
RBC: 4.41 MIL/uL (ref 3.80–5.20)
RDW: 15.5 % — ABNORMAL HIGH (ref 11.5–14.5)
WBC: 9.9 10*3/uL (ref 3.6–11.0)

## 2017-05-18 LAB — TROPONIN I

## 2017-05-18 MED ORDER — PANTOPRAZOLE SODIUM 40 MG PO TBEC
40.0000 mg | DELAYED_RELEASE_TABLET | Freq: Every day | ORAL | Status: DC
Start: 2017-05-18 — End: 2017-05-18
  Administered 2017-05-18: 40 mg via ORAL
  Filled 2017-05-18: qty 1

## 2017-05-18 MED ORDER — CALCIUM CARBONATE ANTACID 500 MG PO CHEW: 1.0000 | CHEWABLE_TABLET | Freq: Four times a day (QID) | ORAL | Status: DC | PRN

## 2017-05-18 MED ORDER — CALCIUM CARBONATE ANTACID 500 MG PO CHEW
1.0000 | CHEWABLE_TABLET | Freq: Four times a day (QID) | ORAL | Status: DC | PRN
  Administered 2017-05-18: 200 mg via ORAL
  Filled 2017-05-18: qty 1

## 2017-05-18 MED ORDER — PANTOPRAZOLE SODIUM 40 MG PO TBEC: 40.0000 mg | DELAYED_RELEASE_TABLET | Freq: Every day | ORAL | 0 refills | Status: DC

## 2017-05-18 MED ORDER — ACETAMINOPHEN 325 MG PO TABS: 650.0000 mg | ORAL_TABLET | Freq: Four times a day (QID) | ORAL | Status: DC | PRN

## 2017-05-18 MED ORDER — ATENOLOL 50 MG PO TABS: 50.0000 mg | ORAL_TABLET | Freq: Every day | ORAL | 0 refills | Status: DC

## 2017-05-18 NOTE — Discharge Instructions (Signed)
Follow-up with primary care physician at [facility]  clinic in a week

## 2017-05-18 NOTE — Plan of Care (Signed)
Problem: Health Behavior/Discharge Planning: Goal: Ability to manage health-related needs will improve Outcome: Adequate for Discharge Medications reviewed.  Stressed importance of taking her BP meds daily.  Keeping track of her prescriptions and when they need refills.  She would do better with auto-refill and a phone call to tell her its ready.  Will find out where her prescriptions are filled and explore it.

## 2017-05-18 NOTE — Consult Note (Signed)
[facility] Cardiology Consultation Note  Patient ID: Rachel Hahn, MRN: [id number], DOB/AGE: December 21, 1955 61 y.o. Admit date: 05/17/2017   Date of Consult: 05/18/2017 Primary Physician: [facility] Primary Cardiologist: None  Chief Complaint:  Chief Complaint  Patient presents with  . Abdominal Pain   Reason for Consult: abdominal pain with mitral valve prolapse  HPI: 61 y.o. female with known previous history of mild mitral valve prolapse essential hypertension mixed hyperlipidemia on appropriate medication management who has had a significant new onset of abdominal pain radiating into the lower chest and into the back area suddenly with the some mild amount of diaphoresis. The patient had full relief of this issue over a short period of time and was admitted to the hospital for further observation. EKG has shown no evidence of significant EKG changes or myocardial infarction with a normal troponin. Currently her chest and abdominal pain has fully resolved and she is hemodynamically stable  Past Medical History:  Diagnosis Date  . Hypertension   . Kidney stones   . Mitral valve prolapse       Surgical History:  Past Surgical History:  Procedure Laterality Date  . KIDNEY CYST REMOVAL    . LITHOTRIPSY    . TONSILLECTOMY       Home Meds: Prior to Admission medications   Medication Sig Start Date End Date Taking? Authorizing Provider  atenolol (TENORMIN) 50 MG tablet Take 50 mg by mouth daily.   Yes [provider]    Inpatient Medications:  . atenolol  50 mg Oral Daily  . enoxaparin (LOVENOX) injection  40 mg Subcutaneous Q24H     Allergies:  Allergies  Allergen Reactions  . Nitrofurantoin Hives  . Nsaids Shortness Of Breath  . Codeine Nausea And Vomiting  . Ibuprofen Other (See Comments)    Shortness of breath/chest pain  . Morphine And Related Nausea And Vomiting  . Sudafed [Pseudoephedrine Hcl] Other (See Comments)    Chest  pain/shortness of breath    Social History   Social History  . Marital status: Single    Spouse name: N/A  . Number of children: N/A  . Years of education: N/A   Occupational History  . Not on file.   Social History Main Topics  . Smoking status: Current Every Day Smoker    Packs/day: 0.50    Years: 20.00    Types: Cigarettes  . Smokeless tobacco: Never Used  . Alcohol use No  . Drug use: No  . Sexual activity: Not on file   Other Topics Concern  . Not on file   Social History Narrative  . No narrative on file     Family History  Problem Relation Age of Onset  . Hypertension Other      Review of Systems Positive for Abdominal pain Negative for: General:  chills, fever, night sweats or weight changes.  Cardiovascular: PND orthopnea syncope dizziness  Dermatological skin lesions rashes Respiratory: Cough congestion Urologic: Frequent urination urination at night and hematuria Abdominal: negative for nausea, vomiting, diarrhea, bright red blood per rectum, melena, or hematemesis Neurologic: negative for visual changes, and/or hearing changes  All other systems reviewed and are otherwise negative except as noted above.  Labs:  Recent Labs  05/17/17 1944 05/18/17 0130  TROPONINI <0.03 <0.03   Lab Results  Component Value Date   WBC 9.9 05/18/2017   HGB 13.9 05/18/2017   HCT 41.0 05/18/2017   MCV 93.0 05/18/2017   PLT 235 05/18/2017  Recent Labs Lab 05/17/17 1944 05/18/17 0130  NA 140 140  K 3.3* 3.5  CL 102 106  CO2 27 28  BUN 10 10  CREATININE 1.04* 0.96  CALCIUM 9.2 8.3*  PROT 7.7  --   BILITOT 0.8  --   ALKPHOS 85  --   ALT 18  --   AST 19  --   GLUCOSE 108* 108*   No results found for: CHOL, HDL, LDLCALC, TRIG No results found for: DDIMER  Radiology/Studies:  Ct Angio Chest/abd/pel For Dissection W And/or W/wo  Result Date: 05/17/2017 CLINICAL DATA:  Upper back pain going through mid back. EXAM: CT ANGIOGRAPHY CHEST, ABDOMEN AND  PELVIS TECHNIQUE: Multidetector CT imaging through the chest, abdomen and pelvis was performed using the standard protocol during bolus administration of intravenous contrast. Multiplanar reconstructed images and MIPs were obtained and reviewed to evaluate the vascular anatomy. CONTRAST:  Isovue 370, 100 mL. COMPARISON:  None. FINDINGS: CTA CHEST FINDINGS Cardiovascular: Preferential opacification of the thoracic aorta. No evidence of thoracic aortic aneurysm or dissection. Normal heart size. No pericardial effusion. Mediastinum/Nodes: No enlarged mediastinal, hilar, or axillary lymph nodes. Thyroid gland, trachea, and esophagus demonstrate no significant findings. Lungs/Pleura: Lungs are clear. No pleural effusion or pneumothorax. Musculoskeletal: No chest wall abnormality. No acute or significant osseous findings. Review of the MIP images confirms the above findings. CTA ABDOMEN AND PELVIS FINDINGS VASCULAR Aorta: Normal caliber aorta without aneurysm, dissection, vasculitis or significant stenosis. Moderate atheromatous change in the infrarenal aorta. Celiac: Patent without evidence of aneurysm, dissection, vasculitis or significant stenosis. SMA: Patent without evidence of aneurysm, dissection, vasculitis or significant stenosis. Renals: Both renal arteries are patent without evidence of aneurysm, dissection, vasculitis, fibromuscular dysplasia or significant stenosis. IMA: High-grade stenosis at its origin, see image 118 series 5. Moderate irregularity but patency distally. Inflow: Patent without evidence of aneurysm, dissection, vasculitis or significant stenosis. Veins: No obvious venous abnormality within the limitations of this arterial phase study. Review of the MIP images confirms the above findings. NON-VASCULAR Hepatobiliary: No focal liver abnormality is seen. No gallstones, gallbladder wall thickening, or biliary dilatation. Pancreas: Unremarkable. No pancreatic ductal dilatation or surrounding  inflammatory changes. Spleen: Normal in size without focal abnormality. Adrenals/Urinary Tract: Normal adrenal glands. Kidneys demonstrate good nephrogram phase, but are small and shrunken. BILATERAL nonobstructing renal calculi are observed. Scattered parapelvic cysts. Stomach/Bowel: Stomach is within normal limits. Appendix appears normal. No evidence of bowel wall thickening, distention, or inflammatory changes. Lymphatic: No significant vascular findings are present. No enlarged abdominal or pelvic lymph nodes. Reproductive: Uterus and bilateral adnexa are unremarkable. Other: No abdominopelvic ascites. Small BILATERAL inguinal hernias containing only fat. Musculoskeletal: No acute or significant osseous findings. Review of the MIP images confirms the above findings. IMPRESSION: No evidence for aortic dissection or significant brachiocephalic/large vessel visceral stenosis or occlusion. High-grade stenosis of the IMA, but distal patency established. BILATERAL renal atrophy, with nephrolithiasis. No proximal renal artery stenosis is evident. Aortic atherosclerosis. Electronically Signed   By: Elsie Stain M.D.   On: 05/17/2017 21:16    EKG: Normal sinus rhythm  Weights: Filed Weights   05/17/17 1923 05/17/17 2338  Weight: 71.7 kg (158 lb) 70.4 kg (155 lb 4.8 oz)     Physical Exam: Blood pressure 140/83, pulse 91, temperature 98.4 F (36.9 C), temperature source Oral, resp. rate 16, height 5\' 1"  (1.549 m), weight 70.4 kg (155 lb 4.8 oz), SpO2 95 %. Body mass index is 29.34 kg/m. General: Well developed, well nourished, in  no acute distress. Head eyes ears nose throat: Normocephalic, atraumatic, sclera non-icteric, no xanthomas, nares are without discharge. No apparent thyromegaly and/or mass  Lungs: Normal respiratory effort.  no wheezes, no rales, no rhonchi.  Heart: RRR with normal S1 S2. no murmur gallop, no rub, PMI is normal size and placement, carotid upstroke normal without bruit,  jugular venous pressure is normal Abdomen: Soft, non-tender, non-distended with normoactive bowel sounds. No hepatomegaly. No rebound/guarding. No obvious abdominal masses. Abdominal aorta is normal size without bruit Extremities: No edema. no cyanosis, no clubbing, no ulcers  Peripheral : 2+ bilateral upper extremity pulses, 2+ bilateral femoral pulses, 2+ bilateral dorsal pedal pulse Neuro: Alert and oriented. No facial asymmetry. No focal deficit. Moves all extremities spontaneously. Musculoskeletal: Normal muscle tone without kyphosis Psych:  Responds to questions appropriately with a normal affect.    Assessment: 61 year old female with essential hypertension mixed hyperlipidemia and mitral valve prolapse with abdominal pain and no current evidence of myocardial infarction or EKG changes  Plan: 1. Further supportive care and treatment options for abdominal pain now resolved 2. Continue hypertension control with hydralazine and labetalol atenolol as necessary for goal systolic blood pressure below 829 mm 3. No further cardiac workup of the lower chest discomfort more related to abdominal pain and not currently suggestive of myocardial infarction 4. Begin ambulation and follow for improvements of symptoms and the further workup if patient has recurrent discomfort  Signed, Lamar Blinks M.D. [facility] Cardiology 05/18/2017, 8:25 AM

## 2017-05-18 NOTE — Care Management (Signed)
Patient is without payor source.  Is followed at [facility].  Home Med is Atenolol.  Provided patient with applications for [facility] but explained could not use pharmacy resources from both clinics- [facility].  Will discharge home today on Protonix.  Provided patient with good Rx coupon and patient will be given script that she can take to [facility] or [facility].  Informed attending.

## 2017-05-18 NOTE — Discharge Summary (Signed)
[facility] Physicians - [place] at [facility]   PATIENT NAME: Rachel Hahn    MR#:  [id number]  DATE OF BIRTH:  1956/08/04  DATE OF ADMISSION:  05/17/2017 ADMITTING PHYSICIAN: Oralia Manis, MD  DATE OF DISCHARGE:  05/18/17 PRIMARY CARE PHYSICIAN: [facility]    ADMISSION DIAGNOSIS:  Hypokalemia [E87.6] Epigastric pain [R10.13] Chest pain [R07.9] Acute midline thoracic back pain [M54.6] Hypertension, unspecified type [I10]  DISCHARGE DIAGNOSIS:  Principal Problem:   Accelerated hypertension Active Problems:   Chest pain GERD  SECONDARY DIAGNOSIS:   Past Medical History:  Diagnosis Date  . Hypertension   . Kidney stones   . Mitral valve prolapse     HOSPITAL COURSE:    HPI Rachel Hahn  is a 60 y.o. female who presents with Epigastric abdominal pain. Patient presented with significantly elevated blood pressure that required intervention with IV medications in the ED. She states that she has had this epigastric abdominal pain today which radiates through to her back. She describes the pain as waxing and waning, but persistent. She had one episode of diarrhea this morning, no nausea or vomiting. She denies any prior cardiac problems. She does have a history of reflux and remote history of peptic ulcer. Initial workup in the ED was largely within normal limits. Hospitalists were called for admission for further evaluation.  # Accelerated hypertension - improved, blood pressure is much better . Continue home medications atenolol 50 mg by mouth once daily    #Chest pain - acute MI ruled out CT chest is no pulmonary embolism   #Epigastric abdominal pain secondary to GERD remote h/o PUD Patient is was on PPI which was discontinued, will resume the same Outpatient follow-up with gastroenterology for possible EGD if no improvement CT abdomen with no acute findings     DISCHARGE CONDITIONS:   Stable  CONSULTS OBTAINED:  Treatment  Team:  Lamar Blinks, MD   PROCEDURES  NONE   DRUG ALLERGIES:   Allergies  Allergen Reactions  . Nitrofurantoin Hives  . Nsaids Shortness Of Breath  . Codeine Nausea And Vomiting  . Ibuprofen Other (See Comments)    Shortness of breath/chest pain  . Morphine And Related Nausea And Vomiting  . Sudafed [Pseudoephedrine Hcl] Other (See Comments)    Chest pain/shortness of breath    DISCHARGE MEDICATIONS:   Current Discharge Medication List    START taking these medications   Details  acetaminophen (TYLENOL) 325 MG tablet Take 2 tablets (650 mg total) by mouth every 6 (six) hours as needed for mild pain (or Fever >/= 101).    calcium carbonate (TUMS - DOSED IN MG ELEMENTAL CALCIUM) 500 MG chewable tablet Chew 1 tablet (200 mg of elemental calcium total) by mouth 4 (four) times daily as needed for indigestion or heartburn.    pantoprazole (PROTONIX) 40 MG tablet Take 1 tablet (40 mg total) by mouth daily. Qty: 30 tablet, Refills: 0      CONTINUE these medications which have CHANGED   Details  atenolol (TENORMIN) 50 MG tablet Take 1 tablet (50 mg total) by mouth daily. Qty: 30 tablet, Refills: 0         DISCHARGE INSTRUCTIONS:   Follow-up with primary care physician in a week Follow-up with gastroenterology in 4 weeks if no improvement  DIET:  Low-salt  DISCHARGE CONDITION:  Stable  ACTIVITY:  Activity as tolerated  OXYGEN:  Home Oxygen: No.    Oxygen Delivery: room air  DISCHARGE LOCATION:  home   If you experience worsening of your admission symptoms, develop shortness of breath, life threatening emergency, suicidal or homicidal thoughts you must seek medical attention immediately by calling 911 or calling your MD immediately  if symptoms less severe.  You Must read complete instructions/literature along with all the possible adverse reactions/side effects for all the Medicines you take and that have been prescribed to you. Take any new Medicines  after you have completely understood and accpet all the possible adverse reactions/side effects.   Please note  You were cared for by a hospitalist during your hospital stay. If you have any questions about your discharge medications or the care you received while you were in the hospital after you are discharged, you can call the unit and asked to speak with the hospitalist on call if the hospitalist that took care of you is not available. Once you are discharged, your primary care physician will handle any further medical issues. Please note that NO REFILLS for any discharge medications will be authorized once you are discharged, as it is imperative that you return to your primary care physician (or establish a relationship with a primary care physician if you do not have one) for your aftercare needs so that they can reassess your need for medications and monitor your lab values.     Today  Chief Complaint  Patient presents with  . Abdominal Pain   Patient had remote history of peptic ulcer disease. Reporting epigastric discomfort intermittently, she was taking Protonix which was discontinued by her will resume the same. Wants to go home  ROS:  CONSTITUTIONAL: Denies fevers, chills. Denies any fatigue, weakness.  EYES: Denies blurry vision, double vision, eye pain. EARS, NOSE, THROAT: Denies tinnitus, ear pain, hearing loss. RESPIRATORY: Denies cough, wheeze, shortness of breath.  CARDIOVASCULAR: Denies chest pain, palpitations, edema.  GASTROINTESTINAL: Denies nausea, vomiting, diarrhea, abdominal pain. Denies bright red blood per rectum. GENITOURINARY: Denies dysuria, hematuria. ENDOCRINE: Denies nocturia or thyroid problems. HEMATOLOGIC AND LYMPHATIC: Denies easy bruising or bleeding. SKIN: Denies rash or lesion. MUSCULOSKELETAL: Denies pain in neck, back, shoulder, knees, hips or arthritic symptoms.  NEUROLOGIC: Denies paralysis, paresthesias.  PSYCHIATRIC: Denies anxiety or  depressive symptoms.   VITAL SIGNS:  Blood pressure 140/83, pulse 91, temperature 98.4 F (36.9 C), temperature source Oral, resp. rate 16, height 5\' 1"  (1.549 m), weight 70.4 kg (155 lb 4.8 oz), SpO2 95 %.  I/O:    Intake/Output Summary (Last 24 hours) at 05/18/17 1353 Last data filed at 05/18/17 1000  Gross per 24 hour  Intake             1120 ml  Output                0 ml  Net             1120 ml    PHYSICAL EXAMINATION:  GENERAL:  61 y.o.-year-old patient lying in the bed with no acute distress.  EYES: Pupils equal, round, reactive to light and accommodation. No scleral icterus. Extraocular muscles intact.  HEENT: Head atraumatic, normocephalic. Oropharynx and nasopharynx clear.  NECK:  Supple, no jugular venous distention. No thyroid enlargement, no tenderness.  LUNGS: Normal breath sounds bilaterally, no wheezing, rales,rhonchi or crepitation. No use of accessory muscles of respiration.  CARDIOVASCULAR: S1, S2 normal. No murmurs, rubs, or gallops.  ABDOMEN: Soft, non-tender, non-distended. Bowel sounds present. No organomegaly or mass.  EXTREMITIES: No pedal edema, cyanosis, or clubbing.  NEUROLOGIC: Cranial nerves II through  XII are intact. Muscle strength 5/5 in all extremities. Sensation intact. Gait not checked.  PSYCHIATRIC: The patient is alert and oriented x 3.  SKIN: No obvious rash, lesion, or ulcer.   DATA REVIEW:   CBC  Recent Labs Lab 05/18/17 0130  WBC 9.9  HGB 13.9  HCT 41.0  PLT 235    Chemistries   Recent Labs Lab 05/17/17 1944 05/18/17 0130  NA 140 140  K 3.3* 3.5  CL 102 106  CO2 27 28  GLUCOSE 108* 108*  BUN 10 10  CREATININE 1.04* 0.96  CALCIUM 9.2 8.3*  AST 19  --   ALT 18  --   ALKPHOS 85  --   BILITOT 0.8  --     Cardiac Enzymes  Recent Labs Lab 05/18/17 0130  TROPONINI <0.03    Microbiology Results  No results found for this or any previous visit.  RADIOLOGY:  Ct Angio Chest/abd/pel For Dissection W And/or  W/wo  Result Date: 05/17/2017 CLINICAL DATA:  Upper back pain going through mid back. EXAM: CT ANGIOGRAPHY CHEST, ABDOMEN AND PELVIS TECHNIQUE: Multidetector CT imaging through the chest, abdomen and pelvis was performed using the standard protocol during bolus administration of intravenous contrast. Multiplanar reconstructed images and MIPs were obtained and reviewed to evaluate the vascular anatomy. CONTRAST:  Isovue 370, 100 mL. COMPARISON:  None. FINDINGS: CTA CHEST FINDINGS Cardiovascular: Preferential opacification of the thoracic aorta. No evidence of thoracic aortic aneurysm or dissection. Normal heart size. No pericardial effusion. Mediastinum/Nodes: No enlarged mediastinal, hilar, or axillary lymph nodes. Thyroid gland, trachea, and esophagus demonstrate no significant findings. Lungs/Pleura: Lungs are clear. No pleural effusion or pneumothorax. Musculoskeletal: No chest wall abnormality. No acute or significant osseous findings. Review of the MIP images confirms the above findings. CTA ABDOMEN AND PELVIS FINDINGS VASCULAR Aorta: Normal caliber aorta without aneurysm, dissection, vasculitis or significant stenosis. Moderate atheromatous change in the infrarenal aorta. Celiac: Patent without evidence of aneurysm, dissection, vasculitis or significant stenosis. SMA: Patent without evidence of aneurysm, dissection, vasculitis or significant stenosis. Renals: Both renal arteries are patent without evidence of aneurysm, dissection, vasculitis, fibromuscular dysplasia or significant stenosis. IMA: High-grade stenosis at its origin, see image 118 series 5. Moderate irregularity but patency distally. Inflow: Patent without evidence of aneurysm, dissection, vasculitis or significant stenosis. Veins: No obvious venous abnormality within the limitations of this arterial phase study. Review of the MIP images confirms the above findings. NON-VASCULAR Hepatobiliary: No focal liver abnormality is seen. No gallstones,  gallbladder wall thickening, or biliary dilatation. Pancreas: Unremarkable. No pancreatic ductal dilatation or surrounding inflammatory changes. Spleen: Normal in size without focal abnormality. Adrenals/Urinary Tract: Normal adrenal glands. Kidneys demonstrate good nephrogram phase, but are small and shrunken. BILATERAL nonobstructing renal calculi are observed. Scattered parapelvic cysts. Stomach/Bowel: Stomach is within normal limits. Appendix appears normal. No evidence of bowel wall thickening, distention, or inflammatory changes. Lymphatic: No significant vascular findings are present. No enlarged abdominal or pelvic lymph nodes. Reproductive: Uterus and bilateral adnexa are unremarkable. Other: No abdominopelvic ascites. Small BILATERAL inguinal hernias containing only fat. Musculoskeletal: No acute or significant osseous findings. Review of the MIP images confirms the above findings. IMPRESSION: No evidence for aortic dissection or significant brachiocephalic/large vessel visceral stenosis or occlusion. High-grade stenosis of the IMA, but distal patency established. BILATERAL renal atrophy, with nephrolithiasis. No proximal renal artery stenosis is evident. Aortic atherosclerosis. Electronically Signed   By: Elsie Stain M.D.   On: 05/17/2017 21:16    EKG:   Orders  placed or performed during the hospital encounter of 05/17/17  . ED EKG within 10 minutes  . ED EKG within 10 minutes  . EKG 12-Lead  . EKG 12-Lead      Management plans discussed with the patient, family and they are in agreement.  CODE STATUS:     Code Status Orders        Start     Ordered   05/17/17 2307  Full code  Continuous     05/17/17 2306    Code Status History    Date Active Date Inactive Code Status Order ID Comments User Context   This patient has a current code status but no historical code status.      TOTAL TIME TAKING CARE OF THIS PATIENT: 45  minutes.   Note: This dictation was prepared with  Dragon dictation along with smaller phrase technology. Any transcriptional errors that result from this process are unintentional.   @MEC @  on 05/18/2017 at 1:53 PM  Between 7am to 6pm - Pager - [phone]  After 6pm go to [email or web address] - password [email or web address] [facility]  [facility]  Office  [phone]  CC: Primary care physician; [facility]

## 2017-05-19 NOTE — Progress Notes (Signed)
MEDICATION RELATED CONSULT NOTE - INITIAL    6/1 1030 :  Per Dr. Ronald Pippins for Protonix 40mg  daily #30 capsules with no refills was called into [facility] on [address] [phone])   Gardner Candle, PharmD, BCPS Clinical Pharmacist 05/19/2017 10:40 AM

## 2021-03-05 DIAGNOSIS — M79601 Pain in right arm: Secondary | ICD-10-CM | POA: Insufficient documentation

## 2021-03-05 DIAGNOSIS — G8929 Other chronic pain: Secondary | ICD-10-CM | POA: Insufficient documentation

## 2021-07-27 ENCOUNTER — Other Ambulatory Visit: Payer: Self-pay | Admitting: Family Medicine

## 2021-07-27 ENCOUNTER — Other Ambulatory Visit (HOSPITAL_COMMUNITY): Payer: Self-pay | Admitting: Family Medicine

## 2021-07-27 DIAGNOSIS — M79601 Pain in right arm: Secondary | ICD-10-CM

## 2021-07-27 DIAGNOSIS — M79604 Pain in right leg: Secondary | ICD-10-CM

## 2021-08-09 ENCOUNTER — Ambulatory Visit: Payer: 59

## 2021-08-17 ENCOUNTER — Ambulatory Visit: Payer: 59

## 2021-08-25 ENCOUNTER — Other Ambulatory Visit (HOSPITAL_COMMUNITY): Payer: Self-pay | Admitting: Student

## 2021-08-25 ENCOUNTER — Other Ambulatory Visit: Payer: Self-pay | Admitting: Student

## 2021-08-25 DIAGNOSIS — G5630 Lesion of radial nerve, unspecified upper limb: Secondary | ICD-10-CM

## 2021-08-25 DIAGNOSIS — G8929 Other chronic pain: Secondary | ICD-10-CM

## 2021-08-25 DIAGNOSIS — M7711 Lateral epicondylitis, right elbow: Secondary | ICD-10-CM

## 2021-08-25 DIAGNOSIS — M79601 Pain in right arm: Secondary | ICD-10-CM

## 2021-08-31 ENCOUNTER — Ambulatory Visit: Payer: 59 | Attending: Student | Admitting: Occupational Therapy

## 2021-08-31 ENCOUNTER — Encounter: Payer: Self-pay | Admitting: Occupational Therapy

## 2021-08-31 DIAGNOSIS — G5631 Lesion of radial nerve, right upper limb: Secondary | ICD-10-CM | POA: Diagnosis present

## 2021-08-31 DIAGNOSIS — M25631 Stiffness of right wrist, not elsewhere classified: Secondary | ICD-10-CM | POA: Insufficient documentation

## 2021-08-31 DIAGNOSIS — M79601 Pain in right arm: Secondary | ICD-10-CM

## 2021-08-31 DIAGNOSIS — M6281 Muscle weakness (generalized): Secondary | ICD-10-CM | POA: Diagnosis present

## 2021-08-31 NOTE — Therapy (Signed)
[facility] [address] [place], [address] Phone: [phone]   Fax:  [phone]  Occupational Therapy Evaluation  Patient Details  Name: Rachel Hahn MRN: [id number] Date of Birth: 23-Jan-1956 Referring Provider (OT): lance Marney Doctor   Encounter Date: 08/31/2021   OT End of Session - 08/31/21 1213     Visit Number 1    Number of Visits 9    Date for OT Re-Evaluation 10/26/21    OT Start Time 0815    OT Stop Time 0908    OT Time Calculation (min) 53 min    Activity Tolerance Patient tolerated treatment well    Behavior During Therapy [facility] for tasks assessed/performed             Past Medical History:  Diagnosis Date   Hypertension    Kidney stones    Mitral valve prolapse     Past Surgical History:  Procedure Laterality Date   KIDNEY CYST REMOVAL     LITHOTRIPSY     TONSILLECTOMY      There were no vitals filed for this visit.   Subjective Assessment - 08/31/21 1203     Subjective  My R arm had been bothering me since March - seen different MD - had 3 shots - and denied MRI 2 x - so will see if they will approve the one for lateral this month - pain about 12/24 hrs -and 2-3 to 9/10 with use - mostly in my forearm and into top of hand    Pertinent History Rachel Hahn is a 65 y.o. female who presents today for evaluation of ongoing right arm pain. The patient began to experience right arm discomfort in March of this year. The patient denies any trauma or any injury affecting the right upper extremity. The patient is right-hand dominant and denies any surgical history to the shoulder, elbow or wrist. The patient has been experiencing pain along the dorsal aspect of the right forearm that can extend into the humerus and down into the right wrist in addition to pain along the volar aspect of the forearm as well. The patient states that the pain is constant all day, she does report occasional burning  discomfort. She states that any activity such as using the arm, lifting or reaching out creates increased discomfort. She denies any loss of range of motion to her shoulder or neck, she reports a 9 out of 10 pain score in the right upper extremity. She does have a history of breaking her right wrist when she was a child. She denies any numbness or tingling the right hand but does report some reduced grip strength. The patient was being evaluated by [facility] who performed several right lateral epicondyle steroid injections in addition to I believe what she is describing a trigger point injection. She also states that she did undergo a nerve conduction study which did not reveal any significant abnormalities, she also underwent x-rays which were negative for acute fracture or process. The patient has been started on several medications including muscle relaxers, anti-inflammatory medication all of which did not provide any significant relief. She did begin gabapentin which did significantly help with her pain however it caused her to be extremely drowsy. The patient was sent to a pain management specialist for questionable fibromyalgia however she states that the pain management specialist told her that she does not treat fibromyalgia. The patient then was following up with her primary care -physician  who obtained rheumatologic labs which were negative and referred her to me for second opinion. The patient is scheduled undergo a forearm MRI scan next week. She denies any changes in her symptoms since being evaluated by [facility]. Had shot for radial tunnel on 08/11/21 by DR Landry Mellow - refer to OT by Horris Latino PA -    Patient Stated Goals I just want the pain better in my R arm so I can use it    Currently in Pain? 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, dog, 2 cats, play games on phone,      AROM   Right Forearm Pronation 90 Degrees   pain   Right Forearm Supination 90 Degrees   pain   Right Wrist Extension 55 Degrees    Right Wrist Flexion 90 Degrees   pain   Right Wrist Radial Deviation --   WNL pain   Right Wrist Ulnar Deviation --   WNL   Left Wrist Extension 70 Degrees    Left Wrist Flexion 90 Degrees      Strength   Right Hand Grip (lbs) 28   extended arm 20lbs   Right Hand Lateral Pinch 13 lbs    Right Hand 3 Point Pinch 9 lbs    Left Hand Grip (lbs) 40   32 extended arm   Left Hand Lateral Pinch 14 lbs    Left Hand 3 Point Pinch 13 lbs      Right Hand AROM   R Thumb Opposition to Index --   pain resistance thumb flexion into forearm            pt to do contrast to R forearm and wrist 3 x day - to decrease pain  Wear wrist splint most all the time  Painfree AROM for wrist flexion ( neutral position  sup, pronation - stop when feeling pain ) 5 reps hold 5 sec  Keep pain with use under 2-3/10           OT Treatments/Exercises (OP) - 08/31/21 0001       RUE Contrast Bath   Time 8 minutes    Comments forearm and hand - prior to review of HEP - decrease pain                    OT Education - 08/31/21 0821     Education Details findings of eval and HEP    Person(s) Educated Patient    Methods Explanation;Demonstration;Tactile cues;Verbal cues;Handout    Comprehension Verbal cues required;Returned demonstration;Verbalized understanding  OT Long Term Goals - 08/31/21 1224       OT LONG TERM GOAL #1   Title Pt to be independent in splint wearing and HEP to decrease pain to less than 4/10 at the worse    Baseline pain 2-9/10 with use- did wear splint less than 1-2 wks and not consistant    Time 3    Period Weeks    Status New    Target Date 09/21/21      OT LONG TERM GOAL #2   Title Pt show pain less than 2/10 with R forearm and wrist AROM in all planes to use in ADL's with more ease    Baseline decrease wrist ext , pain in RD, wrist flexion, sup /pro - and grip - pain 2-9/10 with use in ADl's    Time 6    Period Weeks    Status New    Target Date 10/12/21      OT LONG TERM GOAL #3   Title R UE AROM and strength increase to WNL to wean out of splint and use hand normall in ADL's without increase symptoms    Baseline Decrease AROM ext 55, pain with pron, sup, wrist flexion , RD- pain 2-9/10 with use - fitted with wrist splint to wear most all the time - off bathing ,dressing and HEP    Time 8    Period Weeks    Status New    Target Date 10/26/21                   Plan - 08/31/21 1219     Clinical Impression Statement Pt present with diagnosis of R radial N compression -pt with pain 2-9/10 reported in R forearm and into radial side of hand - increase with use - pushing, pulling, lifting , carrying - Pt is R hand dominant- pain decrease in wrist ext - but pain mostly in supination, pronation, RD, flexion of wrist - increase  more with resistance- show decrease grip and prehension in R hand - with pain in forearm - tender over forearm but no numbness reported - Negative for tenderness over lateral epicondyle, or pain with wrist and 3rd digit ext. Pt to wear wrist splint but most all the time - explain for pt purpose of splint, contrast to decrease pain and tenderness over forearm and wrist prior to pain free AROM - increase pain and decrease ROM and strength all limiting her functional use  of R dominant hand    OT Occupational Profile and History Problem Focused Assessment - Including review of records relating to presenting problem    Occupational performance deficits (Please refer to evaluation for details): ADL's;IADL's;Work;Play;Leisure;Social Participation    Body Structure / Function / Physical Skills ADL;Decreased knowledge of use of DME;Strength;Pain;UE functional use;ROM;IADL;Decreased knowledge of precautions;Flexibility    Rehab Potential Fair    Clinical Decision Making Limited treatment options, no task modification necessary    Comorbidities Affecting Occupational Performance: None    Modification or Assistance to Complete Evaluation  No modification of tasks or assist necessary to complete eval    OT Frequency 1x / week    OT Duration 8 weeks    OT Treatment/Interventions Self-care/ADL training;Ultrasound;Contrast Bath;Therapeutic exercise;Manual Therapy;Patient/family education;Passive range of motion;Therapeutic activities;Splinting;DME and/or AE instruction    Consulted and Agree with Plan of Care Patient             Patient will benefit from skilled therapeutic intervention in order to improve the following  deficits and impairments:   Body Structure / Function / Physical Skills: ADL, Decreased knowledge of use of DME, Strength, Pain, UE functional use, ROM, IADL, Decreased knowledge of precautions, Flexibility       Visit Diagnosis: Pain in right arm - Plan: Ot plan of care cert/re-cert  Muscle weakness (generalized) - Plan: Ot plan of care cert/re-cert  Stiffness of right wrist, not elsewhere classified - Plan: Ot plan of care cert/re-cert  Radial nerve compression, right - Plan: Ot plan of care cert/re-cert    Problem List Patient Active Problem List   Diagnosis Date Noted   Chest pain 05/17/2017   Accelerated hypertension 05/17/2017    Oletta Cohn, OTR/L,CLT 08/31/2021, 12:29 PM  [facility]  PHYS[facility] AND SPORTS MEDICINE [address] [place], [address] Phone: [phone]   Fax:  [phone]  Name: Rachel Hahn MRN: [id number] Date of Birth: Jan 19, 1956

## 2021-09-06 ENCOUNTER — Ambulatory Visit: Payer: 59 | Admitting: Occupational Therapy

## 2021-09-06 DIAGNOSIS — G5631 Lesion of radial nerve, right upper limb: Secondary | ICD-10-CM

## 2021-09-06 DIAGNOSIS — M79601 Pain in right arm: Secondary | ICD-10-CM | POA: Diagnosis not present

## 2021-09-06 DIAGNOSIS — M6281 Muscle weakness (generalized): Secondary | ICD-10-CM

## 2021-09-06 DIAGNOSIS — M25631 Stiffness of right wrist, not elsewhere classified: Secondary | ICD-10-CM

## 2021-09-06 NOTE — Therapy (Signed)
[facility] [address] [place], [address] Phone: [phone]   Fax:  [phone]  Occupational Therapy Treatment  Patient Details  Name: Aviannah Castoro MRN: [id number] Date of Birth: December 05, 1956 Referring Provider (OT): lance Marney Doctor   Encounter Date: 09/06/2021   OT End of Session - 09/06/21 0819     Visit Number 2    Number of Visits 9    Date for OT Re-Evaluation 10/26/21    OT Start Time 0819    OT Stop Time 0900    OT Time Calculation (min) 41 min    Activity Tolerance Patient tolerated treatment well    Behavior During Therapy [facility] for tasks assessed/performed             Past Medical History:  Diagnosis Date   Hypertension    Kidney stones    Mitral valve prolapse     Past Surgical History:  Procedure Laterality Date   KIDNEY CYST REMOVAL     LITHOTRIPSY     TONSILLECTOMY      There were no vitals filed for this visit.   Subjective Assessment - 09/06/21 0818     Subjective  My arm is about the same - pain when picking up, pulling or pushing - did do the ice and heat 2 x -and used splint    Pertinent History Marcel Sorter is a 65 y.o. female who presents today for evaluation of ongoing right arm pain. The patient began to experience right arm discomfort in March of this year. The patient denies any trauma or any injury affecting the right upper extremity. The patient is right-hand dominant and denies any surgical history to the shoulder, elbow or wrist. The patient has been experiencing pain along the dorsal aspect of the right forearm that can extend into the humerus and down into the right wrist in addition to pain along the volar aspect of the forearm as well. The patient states that the pain is constant all day, she does report occasional burning discomfort. She states that any activity such as using the arm, lifting or reaching out creates increased discomfort. She denies any loss of  range of motion to her shoulder or neck, she reports a 9 out of 10 pain score in the right upper extremity. She does have a history of breaking her right wrist when she was a child. She denies any numbness or tingling the right hand but does report some reduced grip strength. The patient was being evaluated by [facility] who performed several right lateral epicondyle steroid injections in addition to I believe what she is describing a trigger point injection. She also states that she did undergo a nerve conduction study which did not reveal any significant abnormalities, she also underwent x-rays which were negative for acute fracture or process. The patient has been started on several medications including muscle relaxers, anti-inflammatory medication all of which did not provide any significant relief. She did begin gabapentin which did significantly help with her pain however it caused her to be extremely drowsy. The patient was sent to a pain management specialist for questionable fibromyalgia however she states that the pain management specialist told her that she does not treat fibromyalgia. The patient then was following up with her primary care -physician who obtained rheumatologic labs which were negative and referred her to me for second opinion. The patient is scheduled undergo a forearm MRI scan next week. She denies any changes in her  symptoms since being evaluated by [facility]. Had shot for radial tunnel on 08/11/21 by DR Landry Mellow - refer to OT by Horris Latino PA -    Patient Stated Goals I just want the pain better in my R arm so I can use it    Currently in Pain? Yes    Pain Score 5     Pain Location Arm    Pain Orientation Right    Pain Descriptors / Indicators Aching;Tender;Burning;Dull    Pain Type Acute pain    Pain Onset More than a month ago                [facility] OT Assessment - 09/06/21 0001       AROM   Right Wrist Extension 55 Degrees    Right Wrist  Flexion 95 Degrees    Right Wrist Radial Deviation --   WNL  -no pain   Right Wrist Ulnar Deviation --   WNL no pain                     OT Treatments/Exercises (OP) - 09/06/21 0001       RUE Contrast Bath   Time 8 minutes    Comments forearm and hand prior to soft tissue and ROM            pt to do contrast to R forearm and wrist 3 x day - to decrease pain  Wear wrist splint most all the time Soft tissue mobs to hand and volar forearm , wrist prior to ROM  Supination in session painfree - AAROM  And wrist flexion, ext, RD, UD   Painfree AROM for wrist flexion ( neutral position  and now in pronation position - open hand  And then loose fist for neutral position )  5 reps hold 5 sec  Pain free AROM for sup /pro after contrast to do  Keep pain with use under 2-3/10           OT Education - 09/06/21 0819     Education Details progress and HEP    Person(s) Educated Patient    Methods Explanation;Demonstration;Tactile cues;Verbal cues;Handout    Comprehension Verbal cues required;Returned demonstration;Verbalized understanding                 OT Long Term Goals - 08/31/21 1224       OT LONG TERM GOAL #1   Title Pt to be independent in splint wearing and HEP to decrease pain to less than 4/10 at the worse    Baseline pain 2-9/10 with use- did wear splint less than 1-2 wks and not consistant    Time 3    Period Weeks    Status New    Target Date 09/21/21      OT LONG TERM GOAL #2   Title Pt show pain less than 2/10 with R forearm and wrist AROM in all planes to use in ADL's with more ease    Baseline decrease wrist ext , pain in RD, wrist flexion, sup /pro - and grip - pain 2-9/10 with use in ADl's    Time 6    Period Weeks    Status New    Target Date 10/12/21      OT LONG TERM GOAL #3   Title R UE AROM and strength increase to WNL to wean out of splint and use hand normall in ADL's without increase symptoms    Baseline Decrease AROM ext  55, pain with pron, sup, wrist flexion , RD- pain 2-9/10 with use - fitted with wrist splint to wear most all the time - off bathing ,dressing and HEP    Time 8    Period Weeks    Status New    Target Date 10/26/21                   Plan - 09/06/21 0819     Clinical Impression Statement Pt present with diagnosis of R radial N compression - Pt show this date decrease pain in wrist AROM in all planes and supinatin in session - tenderness mostly over dorsal forearm this date- pain at the most in session was 5/10 - pt cont to have increase pain with pushing, pulling, lifting , carrying - Pt is R hand dominant. Pt to cont to  wear wrist splint but most all the time -Pt to pay attention if pain is less, smaller area or not as intense or often -  explain for pt purpose of splint, contrast to decrease pain and tenderness over forearm and wrist prior to pain free AROM - pt cont to show increase pain and decrease ROM and strength all limiting her functional use of R dominant hand    OT Occupational Profile and History Problem Focused Assessment - Including review of records relating to presenting problem    Occupational performance deficits (Please refer to evaluation for details): ADL's;IADL's;Work;Play;Leisure;Social Participation    Body Structure / Function / Physical Skills ADL;Decreased knowledge of use of DME;Strength;Pain;UE functional use;ROM;IADL;Decreased knowledge of precautions;Flexibility    Rehab Potential Fair    Clinical Decision Making Limited treatment options, no task modification necessary    Comorbidities Affecting Occupational Performance: None    Modification or Assistance to Complete Evaluation  No modification of tasks or assist necessary to complete eval    OT Frequency 1x / week    OT Duration 8 weeks    OT Treatment/Interventions Self-care/ADL training;Ultrasound;Contrast Bath;Therapeutic exercise;Manual Therapy;Patient/family education;Passive range of  motion;Therapeutic activities;Splinting;DME and/or AE instruction    Consulted and Agree with Plan of Care Patient             Patient will benefit from skilled therapeutic intervention in order to improve the following deficits and impairments:   Body Structure / Function / Physical Skills: ADL, Decreased knowledge of use of DME, Strength, Pain, UE functional use, ROM, IADL, Decreased knowledge of precautions, Flexibility       Visit Diagnosis: Pain in right arm  Muscle weakness (generalized)  Stiffness of right wrist, not elsewhere classified  Radial nerve compression, right    Problem List Patient Active Problem List   Diagnosis Date Noted   Chest pain 05/17/2017   Accelerated hypertension 05/17/2017    Oletta Cohn, OTR/L,CLT 09/06/2021, 1:00 PM  [facility] [address] [place], [address] Phone: [phone]   Fax:  [phone]  Name: Anabeth Chilcott MRN: [id number] Date of Birth: 07-Feb-1956

## 2021-09-08 ENCOUNTER — Ambulatory Visit
Admission: RE | Admit: 2021-09-08 | Discharge: 2021-09-08 | Disposition: A | Discharge status: Home / Self Care | Payer: 59 | Source: Ambulatory Visit | Attending: Student | Admitting: Student

## 2021-09-08 ENCOUNTER — Other Ambulatory Visit: Payer: Self-pay

## 2021-09-08 DIAGNOSIS — G5630 Lesion of radial nerve, unspecified upper limb: Secondary | ICD-10-CM | POA: Diagnosis present

## 2021-09-08 DIAGNOSIS — M79601 Pain in right arm: Secondary | ICD-10-CM

## 2021-09-08 DIAGNOSIS — G8929 Other chronic pain: Secondary | ICD-10-CM | POA: Diagnosis present

## 2021-09-08 DIAGNOSIS — M7711 Lateral epicondylitis, right elbow: Secondary | ICD-10-CM | POA: Insufficient documentation

## 2021-09-08 IMAGING — MR MR ELBOW*R* W/O CM
2 series · 40 of 40 positions shown · non-contrast
Comparison: None.

CLINICAL DATA: Right forearm pain and wrist pain. Ongoing for 6
months.

EXAM:
MRI OF THE RIGHT FOREARM WITHOUT CONTRAST; MRI OF THE RIGHT ELBOW
WITHOUT CONTRAST
TECHNIQUE: Multiplanar, multisequence MR imaging of the right elbow was
performed. No intravenous contrast was administered.
Multiplanar, multisequence MR imaging of the right forearm was

[Series 8: T1 · axial · right · 3.0mm · 0.47mm/px · z∈[+43,+136]mm · 20 of 25 slices shown]
[im 1/25]
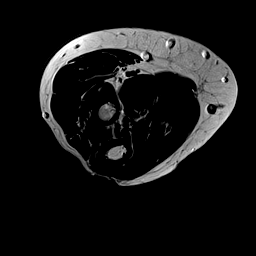
[im 2/25]
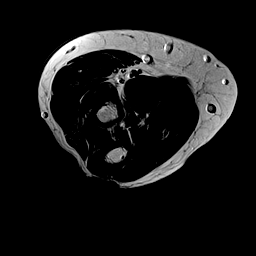
[im 3/25]
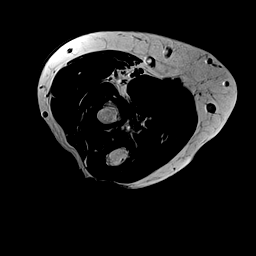
[im 4/25]
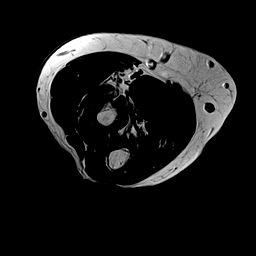
[im 6/25]
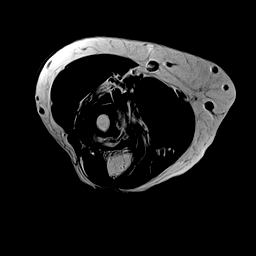
[im 7/25]
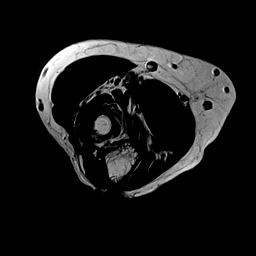
[im 8/25]
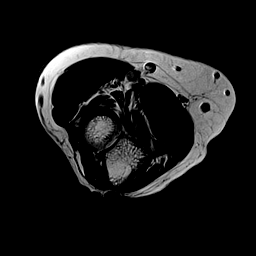
[im 9/25]
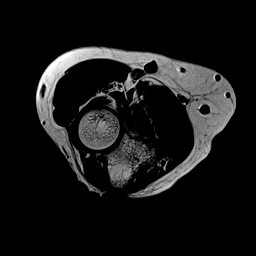
[im 11/25]
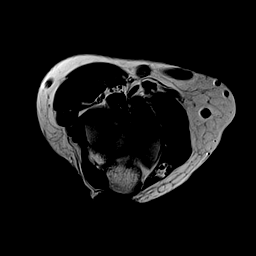
[im 12/25]
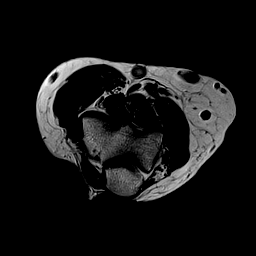
[im 13/25]
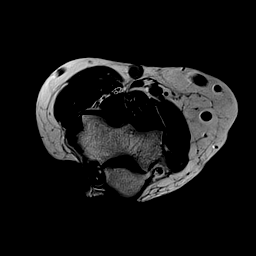
[im 14/25]
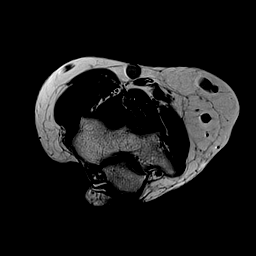
[im 16/25]
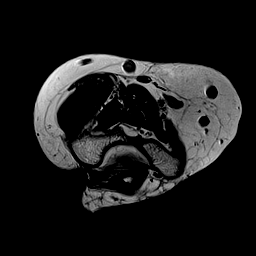
[im 17/25]
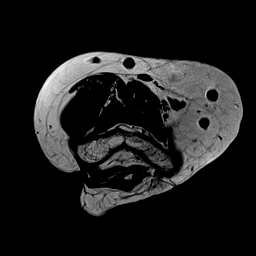
[im 18/25]
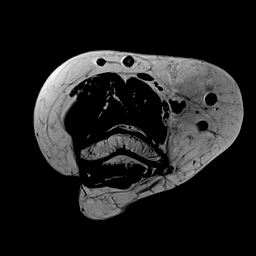
[im 19/25]
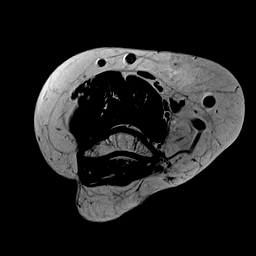
[im 21/25]
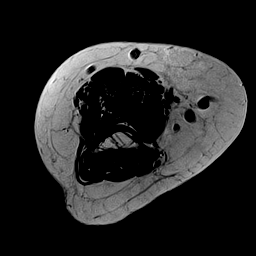
[im 22/25]
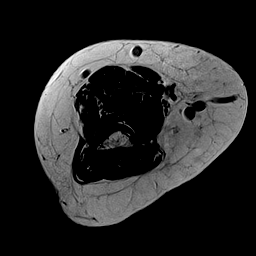
[im 23/25]
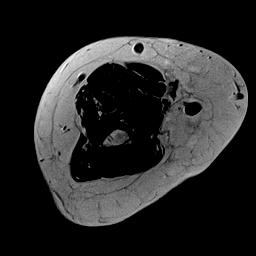
[im 25/25]
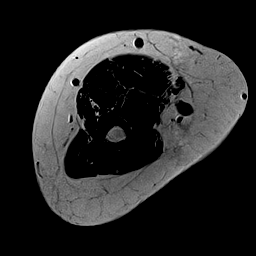

[Series 9: T2 fat-sat · axial · right · 3.0mm · 0.47mm/px · z∈[+43,+136]mm · 20 of 25 slices shown]
[im 1/25]
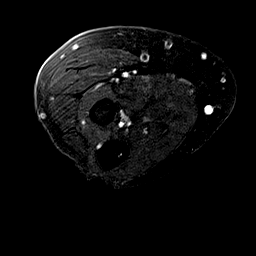
[im 2/25]
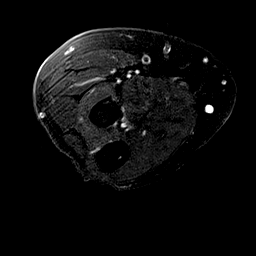
[im 3/25]
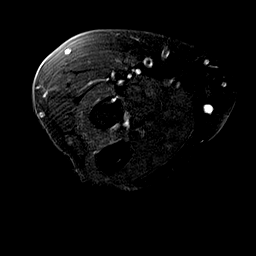
[im 4/25]
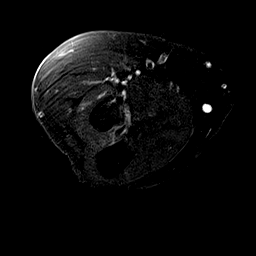
[im 6/25]
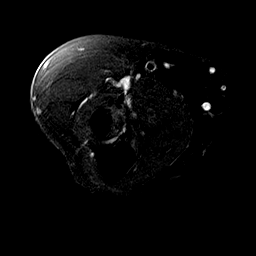
[im 7/25]
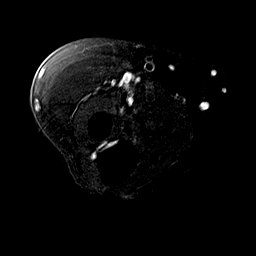
[im 8/25]
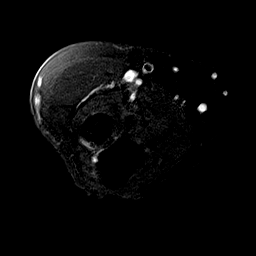
[im 9/25]
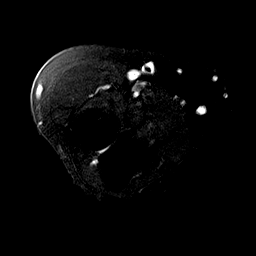
[im 11/25]
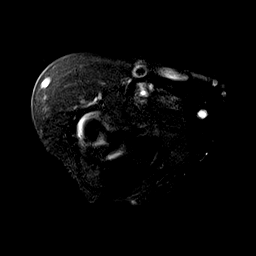
[im 12/25]
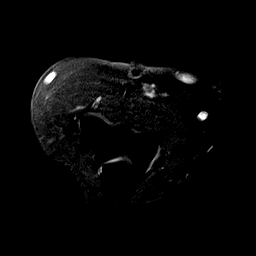
[im 13/25]
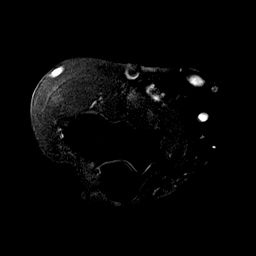
[im 14/25]
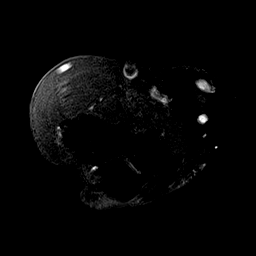
[im 16/25]
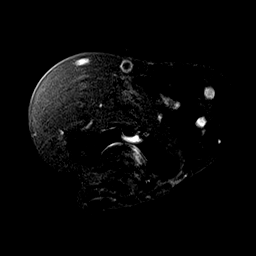
[im 17/25]
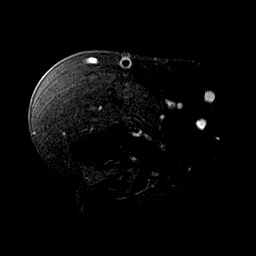
[im 18/25]
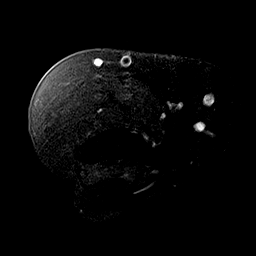
[im 19/25]
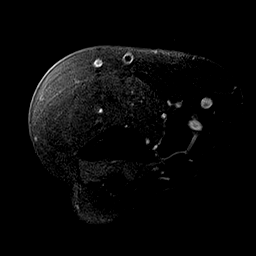
[im 21/25]
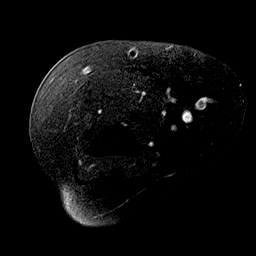
[im 22/25]
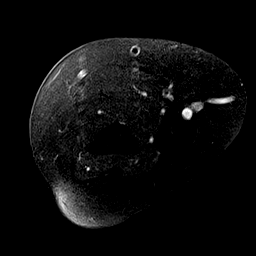
[im 23/25]
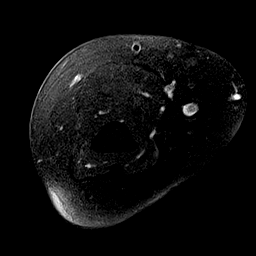
[im 25/25]
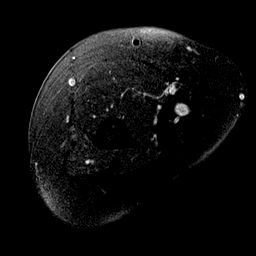

[40 of 40 positions shown; findings below may reference images not displayed]

FINDINGS: The MRI of the elbow was prematurely terminated as the patient was
unable to tolerate the entirety of the exam. Only the axial
sequences were obtained on the MRI of the elbow.

TENDONS

Common forearm flexor origin: Intact

Common forearm extensor origin: Intact

Biceps: Intact

Triceps: Intact

LIGAMENTS

Medial stabilizers: Intact

Lateral stabilizers:  Intact

Cartilage: No chondral defect.

Joint: No joint effusion. No synovitis.

Cubital tunnel: Normal.

Bones: No fracture or dislocation. No marrow abnormality.

Soft Tissues: Muscles are normal. No fluid collection or hematoma.
IMPRESSION: 1. Normal MRI of the right elbow.
2. Normal MRI of the right forearm.

## 2021-09-08 IMAGING — MR MR FOREARM*R* W/O CM
6 series · 38 of 40 positions shown · non-contrast
Comparison: None.

CLINICAL DATA: Right forearm pain and wrist pain. Ongoing for 6
months.

EXAM:
MRI OF THE RIGHT FOREARM WITHOUT CONTRAST; MRI OF THE RIGHT ELBOW
WITHOUT CONTRAST
TECHNIQUE: Multiplanar, multisequence MR imaging of the right elbow was
performed. No intravenous contrast was administered.
Multiplanar, multisequence MR imaging of the right forearm was

[Series 4: T1 · axial · right · 5.0mm · 0.66mm/px · z∈[-97,+161]mm · 11 of 44 slices shown (1 of 3)]
[im 1/44]
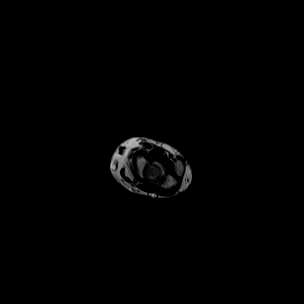
[im 5/44]
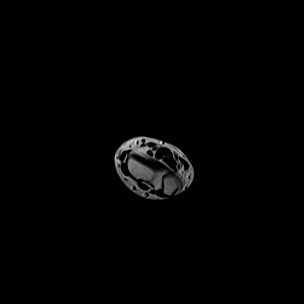
[im 9/44]
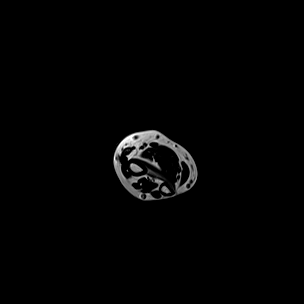
[im 13/44]
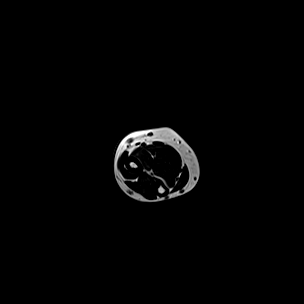
[im 18/44]
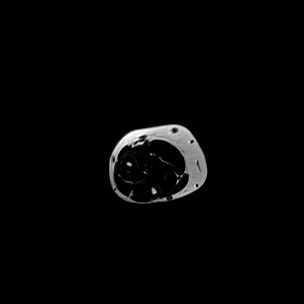
[im 22/44]
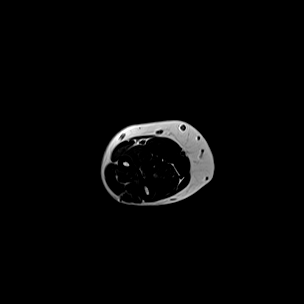
[im 26/44]
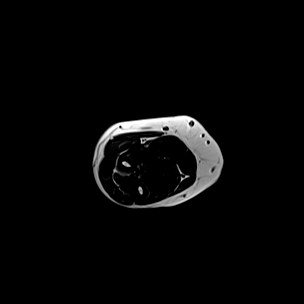
[im 31/44]
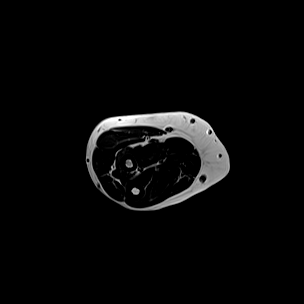
[im 35/44]
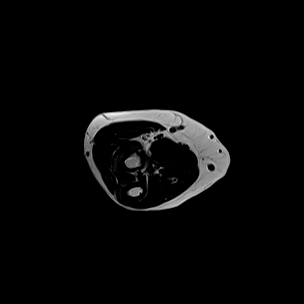
[im 39/44]
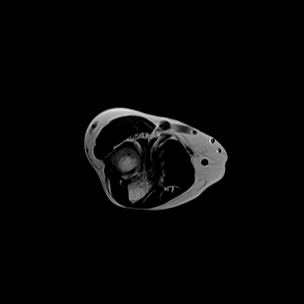
[im 44/44]
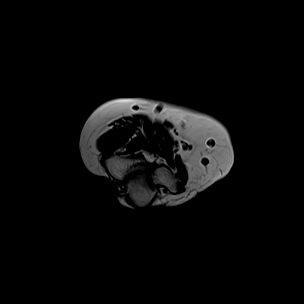

[Series 5: T2 fat-sat · axial · right · 5.0mm · 0.39mm/px · z∈[-97,+161]mm · 11 of 44 slices shown (1 of 2)]
[im 1/44]
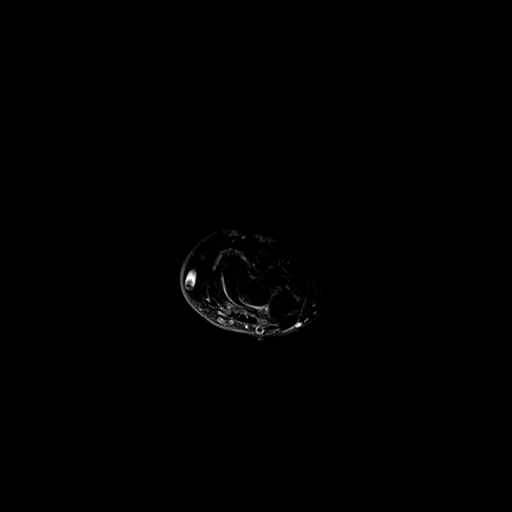
[im 5/44]
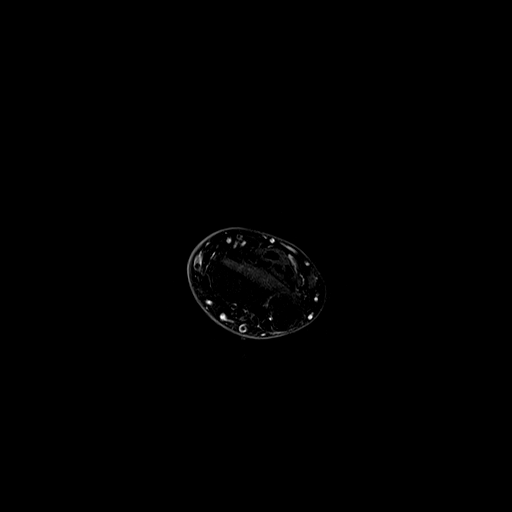
[im 9/44]
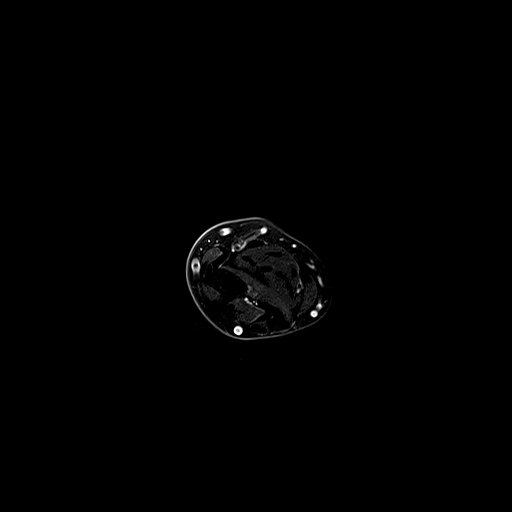
[im 13/44]
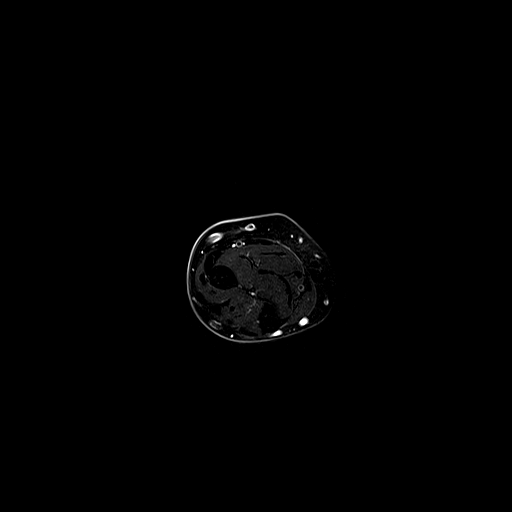
[im 18/44]
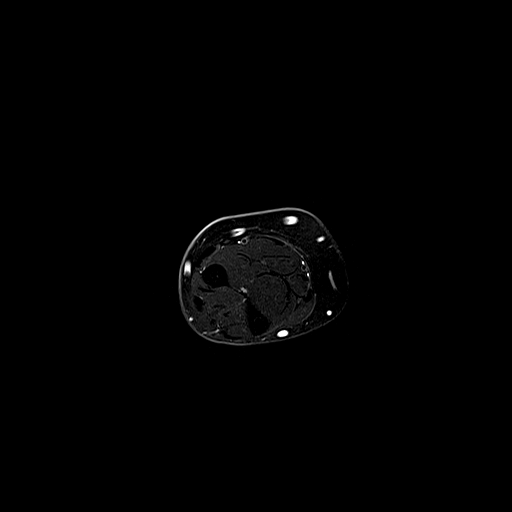
[im 22/44]
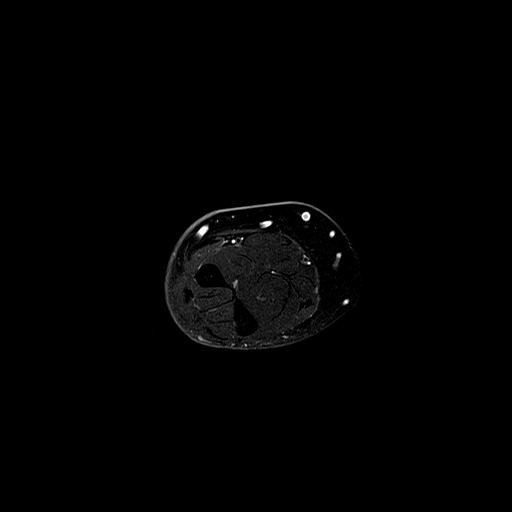
[im 26/44]
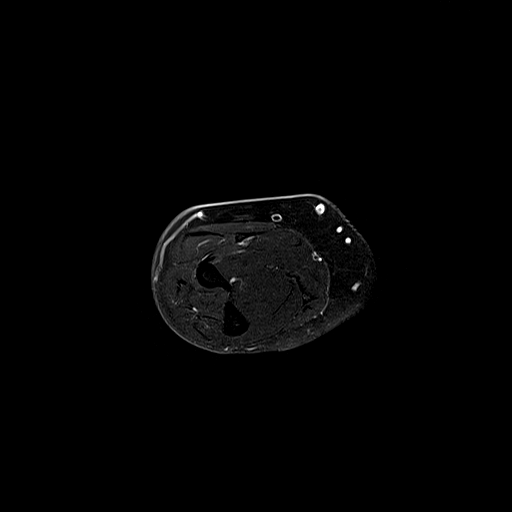
[im 31/44]
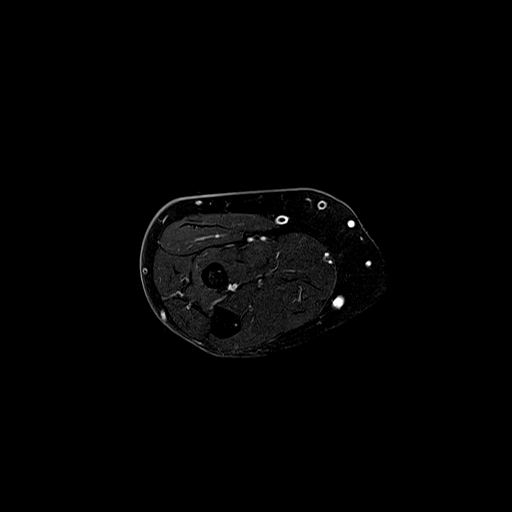
[im 35/44]
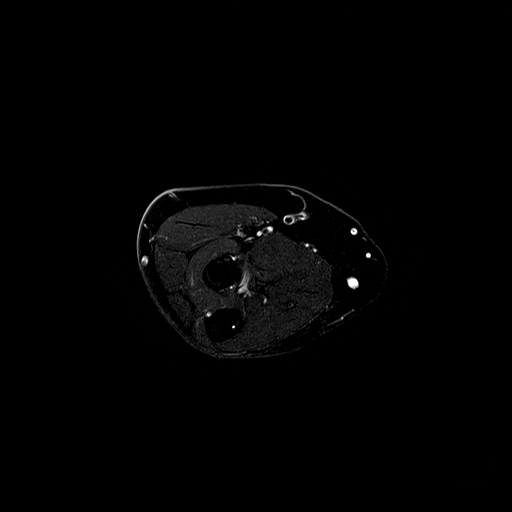
[im 39/44]
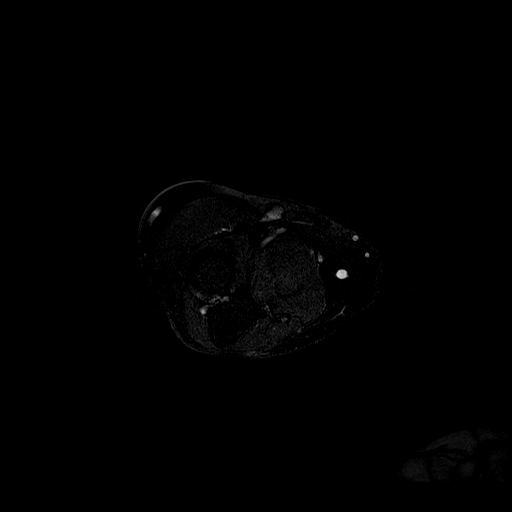
[im 44/44]
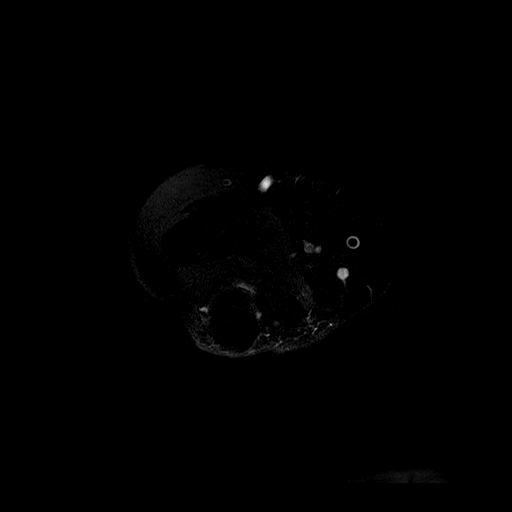

[Series 6: T1 · coronal · right · 4.0mm · 1.04mm/px · 4 of 17 slices shown (2 of 3)]
[im 1/17]
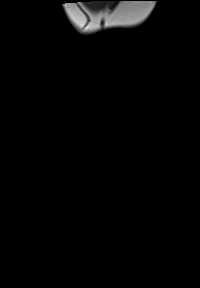
[im 6/17]
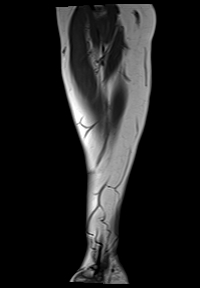
[im 11/17]
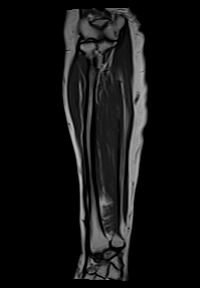
[im 17/17]
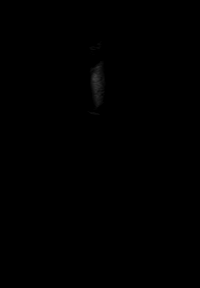

[Series 7: STIR · coronal · right · 4.0mm · 1.04mm/px · 2 of 17 slices shown]
[im 1/17]
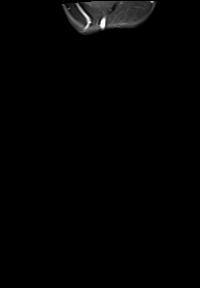
[im 6/17]
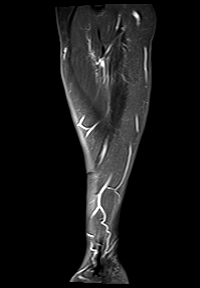

[Series 8: T2 fat-sat · sagittal · right · 4.0mm · 0.89mm/px · 5 of 22 slices shown (2 of 2)]
[im 1/22]
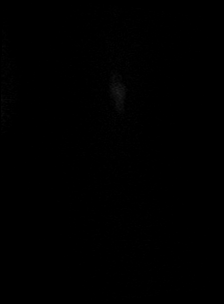
[im 6/22]
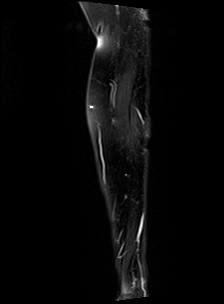
[im 11/22]
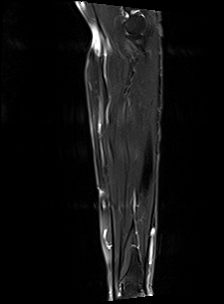
[im 16/22]
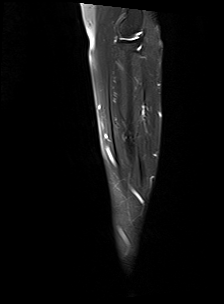
[im 22/22]
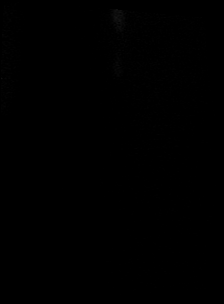

[Series 9: T1 · sagittal · right · 4.0mm · 0.87mm/px · 5 of 22 slices shown (3 of 3)]
[im 1/22]
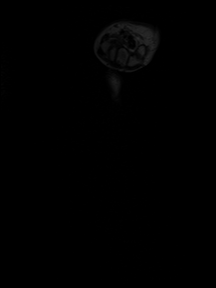
[im 6/22]
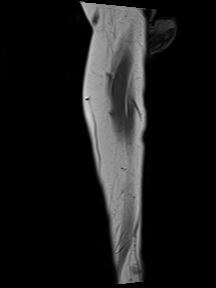
[im 11/22]
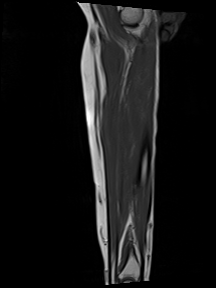
[im 16/22]
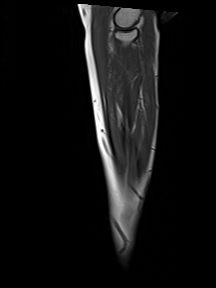
[im 22/22]
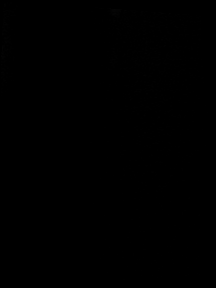

[38 of 40 positions shown; findings below may reference images not displayed]

FINDINGS: The MRI of the elbow was prematurely terminated as the patient was
unable to tolerate the entirety of the exam. Only the axial
sequences were obtained on the MRI of the elbow.

TENDONS

Common forearm flexor origin: Intact

Common forearm extensor origin: Intact

Biceps: Intact

Triceps: Intact

LIGAMENTS

Medial stabilizers: Intact

Lateral stabilizers:  Intact

Cartilage: No chondral defect.

Joint: No joint effusion. No synovitis.

Cubital tunnel: Normal.

Bones: No fracture or dislocation. No marrow abnormality.

Soft Tissues: Muscles are normal. No fluid collection or hematoma.
IMPRESSION: 1. Normal MRI of the right elbow.
2. Normal MRI of the right forearm.

## 2021-09-14 ENCOUNTER — Ambulatory Visit: Payer: 59 | Admitting: Occupational Therapy

## 2021-09-14 DIAGNOSIS — M25631 Stiffness of right wrist, not elsewhere classified: Secondary | ICD-10-CM

## 2021-09-14 DIAGNOSIS — M6281 Muscle weakness (generalized): Secondary | ICD-10-CM

## 2021-09-14 DIAGNOSIS — G5631 Lesion of radial nerve, right upper limb: Secondary | ICD-10-CM

## 2021-09-14 DIAGNOSIS — M79601 Pain in right arm: Secondary | ICD-10-CM | POA: Diagnosis not present

## 2021-09-14 NOTE — Therapy (Signed)
[place] [facility] [address], [place], [address] Phone: [phone]   Fax:  [phone]  Occupational Therapy Treatment  Patient Details  Name: Braylon Lemmons MRN: [id number] Date of Birth: 03-Oct-1956 Referring Provider (OT): lance Marney Doctor   Encounter Date: 09/14/2021   OT End of Session - 09/14/21 0947     Visit Number 3    Number of Visits 9    Date for OT Re-Evaluation 10/26/21    OT Start Time 0946    OT Stop Time 1028    OT Time Calculation (min) 42 min    Activity Tolerance Patient tolerated treatment well    Behavior During Therapy [facility] for tasks assessed/performed             Past Medical History:  Diagnosis Date   Hypertension    Kidney stones    Mitral valve prolapse     Past Surgical History:  Procedure Laterality Date   KIDNEY CYST REMOVAL     LITHOTRIPSY     TONSILLECTOMY      There were no vitals filed for this visit.   Subjective Assessment - 09/14/21 0947     Subjective  The DR called me and said MRI showed no issues- so looks like radial Nerve- I had bad day Sunday and then this morning too - feels about the same    Pertinent History Ryllie Nieland is a 65 y.o. female who presents today for evaluation of ongoing right arm pain. The patient began to experience right arm discomfort in March of this year. The patient denies any trauma or any injury affecting the right upper extremity. The patient is right-hand dominant and denies any surgical history to the shoulder, elbow or wrist. The patient has been experiencing pain along the dorsal aspect of the right forearm that can extend into the humerus and down into the right wrist in addition to pain along the volar aspect of the forearm as well. The patient states that the pain is constant all day, she does report occasional burning discomfort. She states that any activity such as using the arm, lifting or reaching out creates increased  discomfort. She denies any loss of range of motion to her shoulder or neck, she reports a 9 out of 10 pain score in the right upper extremity. She does have a history of breaking her right wrist when she was a child. She denies any numbness or tingling the right hand but does report some reduced grip strength. The patient was being evaluated by [facility] who performed several right lateral epicondyle steroid injections in addition to I believe what she is describing a trigger point injection. She also states that she did undergo a nerve conduction study which did not reveal any significant abnormalities, she also underwent x-rays which were negative for acute fracture or process. The patient has been started on several medications including muscle relaxers, anti-inflammatory medication all of which did not provide any significant relief. She did begin gabapentin which did significantly help with her pain however it caused her to be extremely drowsy. The patient was sent to a pain management specialist for questionable fibromyalgia however she states that the pain management specialist told her that she does not treat fibromyalgia. The patient then was following up with her primary care -physician who obtained rheumatologic labs which were negative and referred her to me for second opinion. The patient is scheduled undergo a forearm MRI scan next week. She denies  any changes in her symptoms since being evaluated by [facility]. Had shot for radial tunnel on 08/11/21 by DR Landry Mellow - refer to OT by Horris Latino PA -    Patient Stated Goals I just want the pain better in my R arm so I can use it    Currently in Pain? Yes    Pain Score 5     Pain Location Arm    Pain Orientation Right    Pain Descriptors / Indicators Aching;Tender;Burning    Pain Type Acute pain    Pain Onset More than a month ago                [facility] OT Assessment - 09/14/21 0001       AROM   Right Forearm Supination  85 Degrees   pain free - pull at the end 90   Right Wrist Extension 65 Degrees    Right Wrist Flexion 95 Degrees            Pt had no pain with supination except last 5 degrees  Increase wrist extention and no pain with AROM wrist ext , flexion ,RD, UD  No tenderness over dorsal wrist or Tinel this date Progress - only had 2 bad days this past week  MRI showed normal elbow and forearm            OT Treatments/Exercises (OP) - 09/14/21 0001       RUE Contrast Bath   Time 8 minutes    Comments forearm and hand            pt to cont with contrast to R forearm and wrist 2 x day - to decrease pain  Wear wrist splint most all the time Soft tissue mobs to hand and volar forearm , wrist prior to ROM  Supination in session painfree - AAROM end range And wrist flexion, ext, RD, UD   Painfree AROM for wrist flexion ( neutral position  and pronation position - open hand  And then  could add loose fist for neutral position and pronation this date- and even light stretch on both  )  5 reps hold 5 sec  Pt to do for 2 days loose fist elbow to side and then try add light stretch for both - pain free - elbow to side  Keep pain with use under 2-3/10         OT Education - 09/14/21 0947     Education Details progress and HEP    Person(s) Educated Patient    Methods Explanation;Demonstration;Tactile cues;Verbal cues;Handout    Comprehension Verbal cues required;Returned demonstration;Verbalized understanding                 OT Long Term Goals - 08/31/21 1224       OT LONG TERM GOAL #1   Title Pt to be independent in splint wearing and HEP to decrease pain to less than 4/10 at the worse    Baseline pain 2-9/10 with use- did wear splint less than 1-2 wks and not consistant    Time 3    Period Weeks    Status New    Target Date 09/21/21      OT LONG TERM GOAL #2   Title Pt show pain less than 2/10 with R forearm and wrist AROM in all planes to use in ADL's with  more ease    Baseline decrease wrist ext , pain in RD, wrist flexion, sup /pro - and  grip - pain 2-9/10 with use in ADl's    Time 6    Period Weeks    Status New    Target Date 10/12/21      OT LONG TERM GOAL #3   Title R UE AROM and strength increase to WNL to wean out of splint and use hand normall in ADL's without increase symptoms    Baseline Decrease AROM ext 55, pain with pron, sup, wrist flexion , RD- pain 2-9/10 with use - fitted with wrist splint to wear most all the time - off bathing ,dressing and HEP    Time 8    Period Weeks    Status New    Target Date 10/26/21                   Plan - 09/14/21 0947     Clinical Impression Statement Pt present with diagnosis of R radial N compression - Pt show this date decrease pain in wrist AROM in all planes and supination coming in -less tenderness over dorsal forearm this date- pain at the most in session was 5/10 - pt cont to have increase pain with pushing, pulling, lifting , carrying - Pt is R hand dominant. Pt to cont to  wear wrist splint but most all the time -Pt to pay attention if pain is less days, smaller area or not as intense or often -  explain for pt purpose of splint, contrast to decrease pain and tenderness over forearm and wrist prior to pain free AROM - was able to add light passive stretch with elbow to side - pt cont to show increase pain and decrease ROM and strength all limiting her functional use of R dominant hand    OT Occupational Profile and History Problem Focused Assessment - Including review of records relating to presenting problem    Occupational performance deficits (Please refer to evaluation for details): ADL's;IADL's;Work;Play;Leisure;Social Participation    Body Structure / Function / Physical Skills ADL;Decreased knowledge of use of DME;Strength;Pain;UE functional use;ROM;IADL;Decreased knowledge of precautions;Flexibility    Rehab Potential Fair    Clinical Decision Making Limited treatment  options, no task modification necessary    Comorbidities Affecting Occupational Performance: None    Modification or Assistance to Complete Evaluation  No modification of tasks or assist necessary to complete eval    OT Frequency 1x / week    OT Duration 8 weeks    OT Treatment/Interventions Self-care/ADL training;Ultrasound;Contrast Bath;Therapeutic exercise;Manual Therapy;Patient/family education;Passive range of motion;Therapeutic activities;Splinting;DME and/or AE instruction    Consulted and Agree with Plan of Care Patient             Patient will benefit from skilled therapeutic intervention in order to improve the following deficits and impairments:   Body Structure / Function / Physical Skills: ADL, Decreased knowledge of use of DME, Strength, Pain, UE functional use, ROM, IADL, Decreased knowledge of precautions, Flexibility       Visit Diagnosis: Pain in right arm  Muscle weakness (generalized)  Stiffness of right wrist, not elsewhere classified  Radial nerve compression, right    Problem List Patient Active Problem List   Diagnosis Date Noted   Chest pain 05/17/2017   Accelerated hypertension 05/17/2017    Oletta Cohn, OTR/L, CLT  09/14/2021, 10:32 AM  [place] [facility] [address], [place], [address] Phone: [phone]   Fax:  [phone]  Name: Zarie Kosiba MRN: [id number] Date of Birth: 24-Feb-1956

## 2021-09-21 ENCOUNTER — Ambulatory Visit: Payer: 59 | Attending: Student | Admitting: Occupational Therapy

## 2021-09-21 DIAGNOSIS — M6281 Muscle weakness (generalized): Secondary | ICD-10-CM | POA: Diagnosis present

## 2021-09-21 DIAGNOSIS — M79601 Pain in right arm: Secondary | ICD-10-CM | POA: Diagnosis not present

## 2021-09-21 DIAGNOSIS — G5631 Lesion of radial nerve, right upper limb: Secondary | ICD-10-CM

## 2021-09-21 DIAGNOSIS — M25631 Stiffness of right wrist, not elsewhere classified: Secondary | ICD-10-CM | POA: Diagnosis present

## 2021-09-21 NOTE — Therapy (Signed)
[place] [facility] [address], [place], [address] Phone: [phone]   Fax:  [phone]  Occupational Therapy Treatment  Patient Details  Name: Rachel Hahn MRN: [id number] Date of Birth: 29-Jun-1956 Referring Provider (OT): lance Marney Doctor   Encounter Date: 09/21/2021   OT End of Session - 09/21/21 1158     Visit Number 4    Number of Visits 9    Date for OT Re-Evaluation 10/26/21    OT Start Time 0955    OT Stop Time 1034    OT Time Calculation (min) 39 min    Activity Tolerance Patient tolerated treatment well    Behavior During Therapy [facility] for tasks assessed/performed             Past Medical History:  Diagnosis Date   Hypertension    Kidney stones    Mitral valve prolapse     Past Surgical History:  Procedure Laterality Date   KIDNEY CYST REMOVAL     LITHOTRIPSY     TONSILLECTOMY      There were no vitals filed for this visit.   Subjective Assessment - 09/21/21 1156     Subjective  Still pain when reaching to put groceries away -or driving when arm up on steering wheel - not really pain with bathing , dressing ,eating -    Pertinent History Rachel Hahn is a 65 y.o. female who presents today for evaluation of ongoing right arm pain. The patient began to experience right arm discomfort in March of this year. The patient denies any trauma or any injury affecting the right upper extremity. The patient is right-hand dominant and denies any surgical history to the shoulder, elbow or wrist. The patient has been experiencing pain along the dorsal aspect of the right forearm that can extend into the humerus and down into the right wrist in addition to pain along the volar aspect of the forearm as well. The patient states that the pain is constant all day, she does report occasional burning discomfort. She states that any activity such as using the arm, lifting or reaching out creates increased discomfort.  She denies any loss of range of motion to her shoulder or neck, she reports a 9 out of 10 pain score in the right upper extremity. She does have a history of breaking her right wrist when she was a child. She denies any numbness or tingling the right hand but does report some reduced grip strength. The patient was being evaluated by [facility] who performed several right lateral epicondyle steroid injections in addition to I believe what she is describing a trigger point injection. She also states that she did undergo a nerve conduction study which did not reveal any significant abnormalities, she also underwent x-rays which were negative for acute fracture or process. The patient has been started on several medications including muscle relaxers, anti-inflammatory medication all of which did not provide any significant relief. She did begin gabapentin which did significantly help with her pain however it caused her to be extremely drowsy. The patient was sent to a pain management specialist for questionable fibromyalgia however she states that the pain management specialist told her that she does not treat fibromyalgia. The patient then was following up with her primary care -physician who obtained rheumatologic labs which were negative and referred her to me for second opinion. The patient is scheduled undergo a forearm MRI scan next week. She denies any changes in her  symptoms since being evaluated by [facility]. Had shot for radial tunnel on 08/11/21 by DR Landry Mellow - refer to OT by Horris Latino PA -    Patient Stated Goals I just want the pain better in my R arm so I can use it    Currently in Pain? Yes    Pain Score 5     Pain Location Arm    Pain Orientation Right    Pain Descriptors / Indicators Aching;Tender    Pain Type Acute pain    Pain Onset More than a month ago    Pain Frequency Intermittent                          OT Treatments/Exercises (OP) - 09/21/21 0001        RUE Contrast Bath   Time 8 minutes    Comments forearm and hand prior to soft tissue and ROM             Pt had no pain with supination except last 5 degrees  Increase wrist extention and flexion- but some pain and tendernenss over volar forearm - flexors and distal radius head  No tenderness over dorsal wrist and Tinel more distal  MRI showed normal elbow and forearm                       pt to cont with contrast to R forearm and wrist 2 x day - to decrease pain  Wear wrist splint most all the time Soft tissue mobs to hand and volar forearm , wrist prior to ROM  Supination in session painfree - AAROM end range And wrist flexion, ext, RD, UD AAROM with elbow to side and extended arm - pain free  Pt to do AAROM elbow to side and extended arm this coming week -pain free    but keep hand open and not AROM - do AAROM / PROM gentle to decrease pain over volar forearm              OT Education - 09/21/21 1158     Education Details progress and HEP    Person(s) Educated Patient    Methods Explanation;Demonstration;Tactile cues;Verbal cues;Handout    Comprehension Verbal cues required;Returned demonstration;Verbalized understanding                 OT Long Term Goals - 08/31/21 1224       OT LONG TERM GOAL #1   Title Pt to be independent in splint wearing and HEP to decrease pain to less than 4/10 at the worse    Baseline pain 2-9/10 with use- did wear splint less than 1-2 wks and not consistant    Time 3    Period Weeks    Status New    Target Date 09/21/21      OT LONG TERM GOAL #2   Title Pt show pain less than 2/10 with R forearm and wrist AROM in all planes to use in ADL's with more ease    Baseline decrease wrist ext , pain in RD, wrist flexion, sup /pro - and grip - pain 2-9/10 with use in ADl's    Time 6    Period Weeks    Status New    Target Date 10/12/21      OT LONG TERM GOAL #3   Title R UE AROM and strength increase to WNL to  wean out of splint and use  hand normall in ADL's without increase symptoms    Baseline Decrease AROM ext 55, pain with pron, sup, wrist flexion , RD- pain 2-9/10 with use - fitted with wrist splint to wear most all the time - off bathing ,dressing and HEP    Time 8    Period Weeks    Status New    Target Date 10/26/21                   Plan - 09/21/21 1159     Clinical Impression Statement Pt present with diagnosis of R radial N compression - Pt show Tinel over dorsal forearm more distal , and  this date decrease pain in wrist AROM/PROM  in all planes and supination coming in - with arm extended too -but had  increase tenderness over Distal radius had and volar forearm - flexors - pain mostly with tenderness over volar forearm  5/10 - pt cont to have increase pain with pushing, pulling, lifting , carrying - Pt is R hand dominant. Pt to cont to  wear wrist splint but most all the time -Pt to pay attention if pain is less days, smaller area or not as intense or often -  explain for pt purpose of splint, contrast to decrease pain and tenderness over forearm and wrist prior to pain free  change to gentle PROM elbow to side and extended arm  - pt cont to show increase pain and decrease ROM and strength all limiting her functional use of R dominant hand    OT Occupational Profile and History Problem Focused Assessment - Including review of records relating to presenting problem    Occupational performance deficits (Please refer to evaluation for details): ADL's;IADL's;Work;Play;Leisure;Social Participation    Body Structure / Function / Physical Skills ADL;Decreased knowledge of use of DME;Strength;Pain;UE functional use;ROM;IADL;Decreased knowledge of precautions;Flexibility    Rehab Potential Fair    Clinical Decision Making Limited treatment options, no task modification necessary    Comorbidities Affecting Occupational Performance: None    Modification or Assistance to Complete Evaluation   No modification of tasks or assist necessary to complete eval    OT Frequency 1x / week    OT Duration 8 weeks    OT Treatment/Interventions Self-care/ADL training;Ultrasound;Contrast Bath;Therapeutic exercise;Manual Therapy;Patient/family education;Passive range of motion;Therapeutic activities;Splinting;DME and/or AE instruction    Consulted and Agree with Plan of Care Patient             Patient will benefit from skilled therapeutic intervention in order to improve the following deficits and impairments:   Body Structure / Function / Physical Skills: ADL, Decreased knowledge of use of DME, Strength, Pain, UE functional use, ROM, IADL, Decreased knowledge of precautions, Flexibility       Visit Diagnosis: Pain in right arm  Muscle weakness (generalized)  Stiffness of right wrist, not elsewhere classified  Radial nerve compression, right    Problem List Patient Active Problem List   Diagnosis Date Noted   Chest pain 05/17/2017   Accelerated hypertension 05/17/2017    Oletta Cohn, OTR/L,CLT 09/21/2021, 12:02 PM   [facility] [address], [place], [address] Phone: [phone]   Fax:  [phone]  Name: Laurianne Floresca MRN: [id number] Date of Birth: 04-Oct-1956

## 2021-09-27 ENCOUNTER — Ambulatory Visit: Payer: 59 | Admitting: Occupational Therapy

## 2021-09-27 DIAGNOSIS — G5631 Lesion of radial nerve, right upper limb: Secondary | ICD-10-CM

## 2021-09-27 DIAGNOSIS — M79601 Pain in right arm: Secondary | ICD-10-CM

## 2021-09-27 DIAGNOSIS — M6281 Muscle weakness (generalized): Secondary | ICD-10-CM

## 2021-09-27 DIAGNOSIS — M25631 Stiffness of right wrist, not elsewhere classified: Secondary | ICD-10-CM

## 2021-09-27 NOTE — Therapy (Signed)
[facility] [address] [place], [address] Phone: [phone]   Fax:  [phone]  Occupational Therapy Treatment  Patient Details  Name: Rachel Hahn MRN: [id number] Date of Birth: 14-Aug-1956 Referring Provider (OT): lance Marney Doctor   Encounter Date: 09/27/2021   OT End of Session - 09/27/21 1112     Visit Number 5    Number of Visits 9    Date for OT Re-Evaluation 10/26/21    OT Start Time 1112    OT Stop Time 1150    OT Time Calculation (min) 38 min    Activity Tolerance Patient tolerated treatment well    Behavior During Therapy [facility] for tasks assessed/performed             Past Medical History:  Diagnosis Date   Hypertension    Kidney stones    Mitral valve prolapse     Past Surgical History:  Procedure Laterality Date   KIDNEY CYST REMOVAL     LITHOTRIPSY     TONSILLECTOMY      There were no vitals filed for this visit.   Subjective Assessment - 09/27/21 1112     Subjective  I had such bad weekend, it hurted every day - I drove a lot and was some on my phone    Pertinent History Rachel Hahn is a 65 y.o. female who presents today for evaluation of ongoing right arm pain. The patient began to experience right arm discomfort in March of this year. The patient denies any trauma or any injury affecting the right upper extremity. The patient is right-hand dominant and denies any surgical history to the shoulder, elbow or wrist. The patient has been experiencing pain along the dorsal aspect of the right forearm that can extend into the humerus and down into the right wrist in addition to pain along the volar aspect of the forearm as well. The patient states that the pain is constant all day, she does report occasional burning discomfort. She states that any activity such as using the arm, lifting or reaching out creates increased discomfort. She denies any loss of range of motion to her shoulder  or neck, she reports a 9 out of 10 pain score in the right upper extremity. She does have a history of breaking her right wrist when she was a child. She denies any numbness or tingling the right hand but does report some reduced grip strength. The patient was being evaluated by [facility] who performed several right lateral epicondyle steroid injections in addition to I believe what she is describing a trigger point injection. She also states that she did undergo a nerve conduction study which did not reveal any significant abnormalities, she also underwent x-rays which were negative for acute fracture or process. The patient has been started on several medications including muscle relaxers, anti-inflammatory medication all of which did not provide any significant relief. She did begin gabapentin which did significantly help with her pain however it caused her to be extremely drowsy. The patient was sent to a pain management specialist for questionable fibromyalgia however she states that the pain management specialist told her that she does not treat fibromyalgia. The patient then was following up with her primary care -physician who obtained rheumatologic labs which were negative and referred her to me for second opinion. The patient is scheduled undergo a forearm MRI scan next week. She denies any changes in her symptoms since being evaluated by [facility]. Had shot for radial tunnel on 08/11/21 by Rachel Hahn - refer to OT by Rachel Hahn -    Patient Stated Goals I just want the pain better in my R arm so I can use it    Currently in Pain? Yes    Pain Score 5     Pain Location --   volar forearm   Pain Orientation Right    Pain Descriptors / Indicators Aching;Tender    Pain Type Acute pain    Pain Onset More than a month ago                          OT Treatments/Exercises (OP) - 09/27/21 0001       Moist Heat Therapy   Number Minutes Moist Heat 8 Minutes     Moist Heat Location --   R forearm          Pt had no pain with supination except last 5 degrees  Increase wrist  flexion- with less pain with elbow to side and extended arm Some discomfort with wrist extention because of tightness and trigger points over volar forearm flexors   No tenderness over dorsal  forearm and wrist  today and not Tinel   MRI showed normal elbow and forearm                        pt to cont heat  to R forearm and wrist 2 x day - to decrease pain  Wear wrist splint most all the time Soft tissue mobs to hand and volar forearm , wrist prior to ROM thisdate - pt to focus on volar forearm soft tissue at home  Supination /pronation in session painfree - AAROM end range And wrist flexion, ext, RD, UD AAROM with elbow to side and extended arm - pain free     but keep hand open and not AROM - do AAROM / PROM gentle with elbow to side -for flexors and extensors - 5 reps hold 5 sec - neutral and pronation position                        OT Education - 09/27/21 1112     Education Details progress and HEP    Person(s) Educated Patient    Methods Explanation;Demonstration;Tactile cues;Verbal cues;Handout    Comprehension Verbal cues required;Returned demonstration;Verbalized understanding                 OT Long Term Goals - 08/31/21 1224       OT LONG TERM GOAL #1   Title Pt to be independent in splint wearing and HEP to decrease pain to less than 4/10 at the worse    Baseline pain 2-9/10 with use- did wear splint less than 1-2 wks and not consistant    Time 3    Period Weeks    Status New    Target Date 09/21/21      OT LONG TERM GOAL #2   Title Pt show pain less than 2/10 with R forearm and wrist AROM in all planes to use in ADL's with more ease    Baseline decrease wrist ext , pain in RD, wrist flexion, sup /pro - and grip - pain 2-9/10 with use in ADl's    Time 6    Period Weeks    Status New    Target Date 10/12/21  OT LONG TERM GOAL #3   Title R UE AROM and strength increase to WNL to wean out of splint and use hand normall in ADL's without increase symptoms    Baseline Decrease AROM ext 55, pain with pron, sup, wrist flexion , RD- pain 2-9/10 with use - fitted with wrist splint to wear most all the time - off bathing ,dressing and HEP    Time 8    Period Weeks    Status New    Target Date 10/26/21                   Plan - 09/27/21 1113     Clinical Impression Statement Pt present with diagnosis of R radial N compression - Pt  no tenderness or Tinel over dorsal forearm -and Negative Finkelstein - no increase pain this date with wrist flexion with elbow to side and extended arm - but tenderness, pain over volar forearm and flexors- pain or pull with strain over dorsal forearm when doing active wrist extention- pt ed on HEP for soft tissue and flexor stretches add  this date - cont with wrist pslint - pain mostly with tenderness over volar forearm  5/10 - pt cont to have pain with pushing, pulling, lifting , carrying , reaching - Pt is R hand dominant. Pt to cont to  wear wrist splint most all the time -Pt to pay attention if pain is less days, smaller area or not as intense or often -  explain for pt purpose of splint, contrast to decrease pain and tenderness over forearm and wrist  - pt cont to show increase pain in flexors now more than extensors and decrease ROM and strength all limiting her functional use of R dominant hand    OT Occupational Profile and History Problem Focused Assessment - Including review of records relating to presenting problem    Occupational performance deficits (Please refer to evaluation for details): ADL's;IADL's;Work;Play;Leisure;Social Participation    Body Structure / Function / Physical Skills ADL;Decreased knowledge of use of DME;Strength;Pain;UE functional use;ROM;IADL;Decreased knowledge of precautions;Flexibility    Rehab Potential Fair    Clinical Decision Making  Limited treatment options, no task modification necessary    Comorbidities Affecting Occupational Performance: None    Modification or Assistance to Complete Evaluation  No modification of tasks or assist necessary to complete eval    OT Frequency 1x / week    OT Duration 8 weeks    OT Treatment/Interventions Self-care/ADL training;Ultrasound;Contrast Bath;Therapeutic exercise;Manual Therapy;Patient/family education;Passive range of motion;Therapeutic activities;Splinting;DME and/or AE instruction    Consulted and Agree with Plan of Care Patient             Patient will benefit from skilled therapeutic intervention in order to improve the following deficits and impairments:   Body Structure / Function / Physical Skills: ADL, Decreased knowledge of use of DME, Strength, Pain, UE functional use, ROM, IADL, Decreased knowledge of precautions, Flexibility       Visit Diagnosis: Muscle weakness (generalized)  Stiffness of right wrist, not elsewhere classified  Radial nerve compression, right  Pain in right arm    Problem List Patient Active Problem List   Diagnosis Date Noted   Chest pain 05/17/2017   Accelerated hypertension 05/17/2017    Rachel Hahn, OTR/L,CLT 09/27/2021, 2:02 PM  [facility] [address] [place], [address] Phone: [phone]   Fax:  [phone]  Name: Nayda Riesen MRN: [id number] Date of Birth:  12/18/1956  

## 2021-10-04 ENCOUNTER — Ambulatory Visit: Payer: 59 | Admitting: Occupational Therapy

## 2021-10-05 DIAGNOSIS — Z789 Other specified health status: Secondary | ICD-10-CM | POA: Insufficient documentation

## 2021-10-05 DIAGNOSIS — G8929 Other chronic pain: Secondary | ICD-10-CM | POA: Insufficient documentation

## 2021-10-05 DIAGNOSIS — Z79899 Other long term (current) drug therapy: Secondary | ICD-10-CM | POA: Insufficient documentation

## 2021-10-05 DIAGNOSIS — G894 Chronic pain syndrome: Secondary | ICD-10-CM | POA: Insufficient documentation

## 2021-10-05 DIAGNOSIS — M899 Disorder of bone, unspecified: Secondary | ICD-10-CM | POA: Insufficient documentation

## 2021-10-05 NOTE — Progress Notes (Signed)
Patient: Rachel Hahn  Service Category: E/M  Provider: Gaspar Cola, MD  DOB: 09-19-1956  DOS: 10/06/2021  Referring Provider: Center, Jenny Reichmann*  MRN: [id number]  Setting: Ambulatory outpatient  PCP: [facility], [place]  Type: New Patient  Specialty: Interventional Pain Management    Location: Office  Delivery: Face-to-face     Primary Reason(s) for Visit: Encounter for initial evaluation of one or more chronic problems (new to examiner) potentially causing chronic pain, and posing a threat to normal musculoskeletal function. (Level of risk: High) CC: Arm Pain (Right/)  HPI  Ms. Pianka is a 65 y.o. year old, female patient, who comes for the first time to our practice referred by Center, Jenny Reichmann* for our initial evaluation of her chronic pain. She has Chest pain; Accelerated hypertension; Chronic upper extremity pain (1ry area of Pain) (Right); Chronic pain syndrome; Pharmacologic therapy; Disorder of skeletal system; Problems influencing health status; Encounter for chronic pain management; Lateral antebrachial cutaneous nerve injury, sequela (Right); Right forearm pain; and Nerve entrapment syndrome of arm (Right) on their problem list. Today she comes in for evaluation of her Arm Pain (Right/)  Pain Assessment: Location: Right Arm Radiating: pain radiaties down her arm to her elbow Onset: More than a month ago Duration: Chronic pain Quality: Aching, Tender Severity: 8 /10 (subjective, self-reported pain score)  Effect on ADL: limits my daily activities Timing: Constant Modifying factors: resting and heating pad BP: (!) 187/111  HR: 84  Onset and Duration: Date of onset: 8 months ago Cause of pain: Unknown Severity: No change since onset and NAS-11 on the average: 8/10 Timing: Not influenced by the time of the day, During activity or exercise, and After activity or exercise Aggravating Factors: Lifiting, Motion, and Twisting Alleviating Factors:  Hot packs and Resting Associated Problems: Fatigue Quality of Pain: Aching, Horrible, Pressure-like, and Uncomfortable Previous Examinations or Tests: CT scan, MRI scan, Nerve block, X-rays, and Nerve conduction test Previous Treatments: The patient denies and none reported  According to the patient the primary area of pain is that of the right forearm were approximately 8 months ago she began to gradually develop pain in the forearm.  This pain seems to be affecting both branches of the lateral and antebrachial cutaneous nerve, especially with supination of the forearm.  During this maneuver the patient typically will experience pain both in the dorsal and volar aspects of the forearm with pain going as far as the dorsal radial thumb area, but not affecting any of the other fingers.  The pain seems to affect the dorsal lateral aspect of the forearm as well as the lateral volar portion of the forearm.  According to the patient, she did not start any new activity or suffered any type of trauma around the time that the pain started.  She uses an arm brace that decreases the mobility of her right wrist.  She did have a trial of gabapentin where she went up as high as 300 mg p.o. at bedtime.  At 300 mg, it did completely eliminate her pain, but it also made her nonfunctional for the rest of the day.  She appears to be rather sensitive to medications.  For the same reason she had to discontinue Robaxin and Lyrica.  According to the patient she had a nerve conduction test which apparently was done at the [facility] practice, but its not available today at the time of this evaluation.  The patient had an MRI of the elbow  and forearm done on 09/08/2021 which did not reveal a clear cause for this pain.  In addition to that she refers having had 3 injections done at [facility].  2 of the injections were done at the level of the elbow and H[place] forearm by Dr. Nelva Bush which did provide her with 100% relief of the pain  for approximately 4 days.  After that the pain returned, just as bad as it was before.  In addition, she also had 1 nerve block done by Dr. Candelaria Stagers and this 1 also lasted approximately 4 days.  This last 1 was ultrasound-guided.  She denies any kind of surgery in that arm and she also indicates currently being involved in physical therapy at the [facility] in the [facility].  She refers that the physical therapy has not helped the pain.  Physical exam of the forearm shows no evidence of redness, swelling, or any other type of visible pathology or findings.  Temperature was taken of both arms and on the right side the Palmar temperature was 90.8 F and the temperature on the dorsum of the hand was 89.7 F.  On the left hand she had a palm marked temperature of 89.4 and a dorsal temperature of 87.6 F.  A Doppler was used to check on the patient's radial and ulnar pulses, both of which were present and strong.  Flow through the palm are arc was checked and found to be equal through both sides with no apparent deficits.  Capillary fill was less than 7 seconds, bilaterally.  Today I took the time to provide the patient with information regarding my pain practice. The patient was informed that my practice is divided into two sections: an interventional pain management section, as well as a completely separate and distinct medication management section. I explained that I have procedure days for my interventional therapies, and evaluation days for follow-ups and medication management. Because of the amount of documentation required during both, they are kept separated. This means that there is the possibility that she may be scheduled for a procedure on one day, and medication management the next. I have also informed her that because of staffing and facility limitations, I no longer take patients for medication management only. To illustrate the reasons for this, I gave the patient the example  of surgeons, and how inappropriate it would be to refer a patient to his/her care, just to write for the post-surgical antibiotics on a surgery done by a different surgeon.   In terms of the medication trial done by the patient she indicated having tried the gabapentin 300 mg for several days, but I had to be stopped secondary to the fact that she became nonfunctional.  The dose was then lowered to 100 mg at bedtime at which level it did not give her any problems, but it also did not help her pain.  After 1 week, this was bumped up to 200 mg and trialed for another week, and again it did not help.  At this time, I really do not see much that I can offer her in terms of interventional therapies over medication recommendations since she has already had her injections done by Dr. Candelaria Stagers and Dr. Nelva Bush, with no long-term success.  During the patient's evaluation, I came out that she has not seen a neurologist or neurosurgeon and therefore I have made arrangements for her to see a neurologist at the [facility].  If they can confirm that the patient indeed  has an entrapment of the lateral antebrachial cutaneous nerve, we will let them refer her to neurosurgery for possible release of the entrapment.  Historic Controlled Substance Pharmacotherapy Review  PMP and historical list of controlled substances: Pregabalin 100 mg capsule, 1 daily; tramadol 50 mg, 1 3 times daily (#15/5 days) (last filled on 08/25/2021) Current opioid analgesics: .   Tramadol 50 mg, 1 tab p.o. 3 times daily MME/day: 15 mg/day   Historical Monitoring: The patient  reports no history of drug use. List of all UDS Test(s): No results found for: MDMA, COCAINSCRNUR, [place], [place], CANNABQUANT, THCU, [place] List of other Serum/Urine Drug Screening Test(s):  No results found for: AMPHSCRSER, BARBSCRSER, BENZOSCRSER, COCAINSCRSER, COCAINSCRNUR, PCPSCRSER, PCPQUANT, THCSCRSER, THCU, CANNABQUANT, OPIATESCRSER, OXYSCRSER, PROPOXSCRSER,  ETH Historical Background Evaluation: [place] PMP: PDMP reviewed during this encounter. Online review of the past 50-monthperiod conducted.             PMP NARX Score Report:  Narcotic: 130 Sedative: 161 Stimulant: 000 [place] Department of public safety, offender search: (Editor, commissioningInformation) Non-contributory Risk Assessment Profile: Aberrant behavior: None observed or detected today Risk factors for fatal opioid overdose: None identified today PMP NARX Overdose Risk Score: 330 Fatal overdose hazard ratio (HR): Calculation deferred Non-fatal overdose hazard ratio (HR): Calculation deferred Risk of opioid abuse or dependence: 0.7-3.0% with doses ? 36 MME/day and 6.1-26% with doses ? 120 MME/day. Substance use disorder (SUD) risk level: See below Personal History of Substance Abuse (SUD-Substance use disorder):  Alcohol: Negative  Illegal Drugs: Negative  Rx Drugs: Negative  ORT Risk Level calculation: Low Risk  Opioid Risk Tool - 10/06/21 1123       Family History of Substance Abuse   Alcohol Negative    Illegal Drugs Negative    Rx Drugs Negative      Personal History of Substance Abuse   Alcohol Negative    Illegal Drugs Negative    Rx Drugs Negative      Age   Age between 17-45years  No      History of Preadolescent Sexual Abuse   History of Preadolescent Sexual Abuse Negative or Female      Psychological Disease   Psychological Disease Negative    Depression Positive      Total Score   Opioid Risk Tool Scoring 1    Opioid Risk Interpretation Low Risk            ORT Scoring interpretation table:  Score <3 = Low Risk for SUD  Score between 4-7 = Moderate Risk for SUD  Score >8 = High Risk for Opioid Abuse   PHQ-2 Depression Scale:  Total score:    PHQ-2 Scoring interpretation table: (Score and probability of major depressive disorder)  Score 0 = No depression  Score 1 = 15.4% Probability  Score 2 = 21.1% Probability  Score 3 = 38.4% Probability  Score 4 = 45.5%  Probability  Score 5 = 56.4% Probability  Score 6 = 78.6% Probability   PHQ-9 Depression Scale:  Total score:    PHQ-9 Scoring interpretation table:  Score 0-4 = No depression  Score 5-9 = Mild depression  Score 10-14 = Moderate depression  Score 15-19 = Moderately severe depression  Score 20-27 = Severe depression (2.4 times higher risk of SUD and 2.89 times higher risk of overuse)   Pharmacologic Plan: As per protocol, I have not taken over any controlled substance management, pending the results of ordered tests and/or consults.  Initial impression: Pending review of available data and ordered tests.  Meds   Current Outpatient Medications:    acetaminophen (TYLENOL) 500 MG tablet, Tylenol Extra Strength 500 mg tablet, Disp: , Rfl:    albuterol (VENTOLIN HFA) 108 (90 Base) MCG/ACT inhaler, ProAir HFA, Disp: , Rfl:    albuterol (VENTOLIN HFA) 108 (90 Base) MCG/ACT inhaler, , Disp: , Rfl:    atenolol (TENORMIN) 100 MG tablet, Take 100 mg by mouth daily., Disp: , Rfl:    Cholecalciferol (VITAMIN D3) 1.25 MG (50000 UT) CAPS, Vitamin D3, Disp: , Rfl:    pantoprazole (PROTONIX) 40 MG tablet, Take 1 tablet (40 mg total) by mouth daily., Disp: 30 tablet, Rfl: 0   sertraline (ZOLOFT) 100 MG tablet, Take 100 mg by mouth daily., Disp: , Rfl:    Sertraline HCl (ZOLOFT PO), sertraline, Disp: , Rfl:    VITAMIN D-1000 MAX ST 25 MCG (1000 UT) tablet, Take 1,000 Units by mouth daily., Disp: , Rfl:   Imaging Review   Complexity Note: No results found under the [place] record.                        ROS  Cardiovascular: High blood pressure and Heart valve problems Pulmonary or Respiratory: Smoking, Snoring , and Coughing up mucus (Bronchitis) Neurological: No reported neurological signs or symptoms such as seizures, abnormal skin sensations, urinary and/or fecal incontinence, being born with an abnormal open spine and/or a tethered spinal  cord Psychological-Psychiatric: Anxiousness, Depressed, and Prone to panicking Gastrointestinal: Reflux or heatburn and Alternating episodes iof diarrhea and constipation (IBS-Irritable bowe syndrome) Genitourinary: Passing kidney stones Hematological: No reported hematological signs or symptoms such as prolonged bleeding, low or poor functioning platelets, bruising or bleeding easily, hereditary bleeding problems, low energy levels due to low hemoglobin or being anemic Endocrine: No reported endocrine signs or symptoms such as high or low blood sugar, rapid heart rate due to high thyroid levels, obesity or weight gain due to slow thyroid or thyroid disease Rheumatologic: Joint aches and or swelling due to excess weight (Osteoarthritis) Musculoskeletal: Negative for myasthenia gravis, muscular dystrophy, multiple sclerosis or malignant hyperthermia Work History: Working part time  Allergies  Ms. Cosma is allergic to nitrofurantoin, nsaids, codeine, ibuprofen, morphine and related, and sudafed [pseudoephedrine hcl].  Laboratory Chemistry Profile   Renal Lab Results  Component Value Date   BUN 10 05/18/2017   CREATININE 0.96 05/18/2017   GFRAA >60 05/18/2017   GFRNONAA >60 05/18/2017     Electrolytes Lab Results  Component Value Date   NA 140 05/18/2017   K 3.5 05/18/2017   CL 106 05/18/2017   CALCIUM 8.3 (L) 05/18/2017     Hepatic Lab Results  Component Value Date   AST 19 05/17/2017   ALT 18 05/17/2017   ALBUMIN 4.4 05/17/2017   ALKPHOS 85 05/17/2017   LIPASE 28 05/17/2017     ID No results found for: LYMEIGGIGMAB, HIV, SARSCOV2NAA, STAPHAUREUS, MRSAPCR, HCVAB, PREGTESTUR, RMSFIGG, QFVRPH1IGG, QFVRPH2IGG, LYMEIGGIGMAB   Bone No results found for: VD25OH, GY174BS4HQP, RF1638GY6, ZL9357SV7, 25OHVITD1, 25OHVITD2, 25OHVITD3, TESTOFREE, TESTOSTERONE   Endocrine Lab Results  Component Value Date   GLUCOSE 108 (H) 05/18/2017     Neuropathy No results found for:  VITAMINB12, FOLATE, HGBA1C, HIV   CNS No results found for: COLORCSF, APPEARCSF, RBCCOUNTCSF, WBCCSF, POLYSCSF, LYMPHSCSF, EOSCSF, PROTEINCSF, GLUCCSF, JCVIRUS, CSFOLI, IGGCSF, LABACHR, ACETBL, LABACHR, ACETBL   Inflammation (CRP: Acute  ESR: Chronic) Lab Results  Component Value  Date   LATICACIDVEN 1.4 05/17/2017     Rheumatology No results found for: RF, ANA, LABURIC, URICUR, LYMEIGGIGMAB, LYMEABIGMQN, HLAB27   Coagulation Lab Results  Component Value Date   PLT 235 05/18/2017     Cardiovascular Lab Results  Component Value Date   TROPONINI <0.03 05/18/2017   HGB 13.9 05/18/2017   HCT 41.0 05/18/2017     Screening No results found for: SARSCOV2NAA, COVIDSOURCE, STAPHAUREUS, MRSAPCR, HCVAB, HIV, PREGTESTUR   Cancer No results found for: CEA, CA125, LABCA2   Allergens No results found for: ALMOND, APPLE, ASPARAGUS, AVOCADO, BANANA, BARLEY, BASIL, BAYLEAF, GREENBEAN, LIMABEAN, WHITEBEAN, BEEFIGE, REDBEET, BLUEBERRY, BROCCOLI, CABBAGE, MELON, CARROT, CASEIN, CASHEWNUT, CAULIFLOWER, CELERY     Note: Lab results reviewed.  [place]  Drug: Ms. Whitfill  reports no history of drug use. Alcohol:  reports no history of alcohol use. Tobacco:  reports that she has been smoking cigarettes. She has a 10.00 pack-year smoking history. She has never used smokeless tobacco. Medical:  has a past medical history of Hypertension, Kidney stones, and Mitral valve prolapse. Family: family history includes Hypertension in an other family member.  Past Surgical History:  Procedure Laterality Date   KIDNEY CYST REMOVAL     LITHOTRIPSY     TONSILLECTOMY     Active Ambulatory Problems    Diagnosis Date Noted   Chest pain 05/17/2017   Accelerated hypertension 05/17/2017   Chronic upper extremity pain (1ry area of Pain) (Right) 03/05/2021   Chronic pain syndrome 10/05/2021   Pharmacologic therapy 10/05/2021   Disorder of skeletal system 10/05/2021   Problems influencing health status  10/05/2021   Encounter for chronic pain management 10/05/2021   Lateral antebrachial cutaneous nerve injury, sequela (Right) 10/06/2021   Right forearm pain 10/06/2021   Nerve entrapment syndrome of arm (Right) 10/06/2021   Resolved Ambulatory Problems    Diagnosis Date Noted   No Resolved Ambulatory Problems   Past Medical History:  Diagnosis Date   Hypertension    Kidney stones    Mitral valve prolapse    Constitutional Exam  General appearance: Well nourished, well developed, and well hydrated. In no apparent acute distress Vitals:   10/06/21 1112  BP: (!) 187/111  Pulse: 84  Resp: 16  Temp: (!) 97.1 F (36.2 C)  SpO2: 99%  Weight: 173 lb (78.5 kg)  Height: 5' 3"  (1.6 m)   BMI Assessment: Estimated body mass index is 30.65 kg/m as calculated from the following:   Height as of this encounter: 5' 3"  (1.6 m).   Weight as of this encounter: 173 lb (78.5 kg).  BMI interpretation table: BMI level Category Range association with higher incidence of chronic pain  <18 kg/m2 Underweight   18.5-24.9 kg/m2 Ideal body weight   25-29.9 kg/m2 Overweight Increased incidence by 20%  30-34.9 kg/m2 Obese (Class I) Increased incidence by 68%  35-39.9 kg/m2 Severe obesity (Class II) Increased incidence by 136%  >40 kg/m2 Extreme obesity (Class III) Increased incidence by 254%   Patient's current BMI Ideal Body weight  Body mass index is 30.65 kg/m. Ideal body weight: 52.4 kg (115 lb 8.3 oz) Adjusted ideal body weight: 62.8 kg (138 lb 8.2 oz)   BMI Readings from Last 4 Encounters:  10/06/21 30.65 kg/m  05/17/17 29.34 kg/m  04/20/16 27.40 kg/m   Wt Readings from Last 4 Encounters:  10/06/21 173 lb (78.5 kg)  05/17/17 155 lb 4.8 oz (70.4 kg)  04/20/16 145 lb (65.8 kg)    Psych/Mental status: Alert, oriented  x 3 (person, place, & time)       Eyes: PERLA Respiratory: No evidence of acute respiratory distress  Assessment  Primary Diagnosis & Pertinent Problem List: The  primary encounter diagnosis was Chronic upper extremity pain (1ry area of Pain) (Right). Diagnoses of Chronic pain syndrome, Pharmacologic therapy, Disorder of skeletal system, Problems influencing health status, Encounter for chronic pain management, Lateral antebrachial cutaneous nerve injury, sequela (Right), Right forearm pain, and Nerve entrapment syndrome of arm (Right) were also pertinent to this visit.  Visit Diagnosis (New problems to examiner): 1. Chronic upper extremity pain (1ry area of Pain) (Right)   2. Chronic pain syndrome   3. Pharmacologic therapy   4. Disorder of skeletal system   5. Problems influencing health status   6. Encounter for chronic pain management   7. Lateral antebrachial cutaneous nerve injury, sequela (Right)   8. Right forearm pain   9. Nerve entrapment syndrome of arm (Right)    Plan of Care (Initial workup plan)  Note: Ms. Keinath was reminded that as per protocol, today's visit has been an evaluation only. We have not taken over the patient's controlled substance management.  Problem-specific plan: No problem-specific Assessment & Plan notes found for this encounter. Lab Orders  No laboratory test(s) ordered today   Imaging Orders  No imaging studies ordered today   Referral Orders         Ambulatory referral to Neurology     Procedure Orders    No procedure(s) ordered today   Pharmacotherapy (current): Medications ordered:  No orders of the defined types were placed in this encounter.  Medications administered during this visit: Iniya Matzek had no medications administered during this visit.   Pharmacological management options:  Opioid Analgesics: The patient was informed that there is no guarantee that she would be a candidate for opioid analgesics. The decision will be made following CDC guidelines. This decision will be based on the results of diagnostic studies, as well as Ms. Tigue risk profile.   Membrane stabilizer: To be  determined at a later time  Muscle relaxant: To be determined at a later time  NSAID: To be determined at a later time  Other analgesic(s): To be determined at a later time   Interventional management options: Ms. Ayotte was informed that there is no guarantee that she would be a candidate for interventional therapies. The decision will be based on the results of diagnostic studies, as well as Ms. Corbridge risk profile.  Procedure(s) under consideration:  Pending results of ordered studies      Interventional Therapies  Risk  Complexity Considerations:   Estimated body mass index is 30.65 kg/m as calculated from the following:   Height as of this encounter: 5' 3"  (1.6 m).   Weight as of this encounter: 173 lb (78.5 kg). WNL   Planned  Pending:   Referral to neurology for evaluation of a possible right lateral antebrachial cutaneous nerve entrapment.    Under consideration:   None   Completed:   None at this time   Therapeutic  Palliative (PRN) options:   None established    Provider-requested follow-up: No follow-ups on file.  Future Appointments  Date Time Provider [place]  10/12/2021 11:15 AM Rosalyn Gess, OT/L [facility] None    Note by: Gaspar Cola, MD Date: 10/06/2021; Time: 12:39 PM

## 2021-10-06 ENCOUNTER — Ambulatory Visit: Payer: 59 | Attending: Pain Medicine | Admitting: Pain Medicine

## 2021-10-06 ENCOUNTER — Encounter: Payer: Self-pay | Admitting: Pain Medicine

## 2021-10-06 ENCOUNTER — Other Ambulatory Visit: Payer: Self-pay

## 2021-10-06 VITALS — BP 187/111 | HR 84 | Temp 97.1°F | Resp 16 | Ht 63.0 in | Wt 173.0 lb

## 2021-10-06 DIAGNOSIS — G8929 Other chronic pain: Secondary | ICD-10-CM | POA: Insufficient documentation

## 2021-10-06 DIAGNOSIS — Z79899 Other long term (current) drug therapy: Secondary | ICD-10-CM | POA: Insufficient documentation

## 2021-10-06 DIAGNOSIS — G5691 Unspecified mononeuropathy of right upper limb: Secondary | ICD-10-CM | POA: Insufficient documentation

## 2021-10-06 DIAGNOSIS — S5431XS Injury of cutaneous sensory nerve at forearm level, right arm, sequela: Secondary | ICD-10-CM | POA: Diagnosis present

## 2021-10-06 DIAGNOSIS — M79631 Pain in right forearm: Secondary | ICD-10-CM

## 2021-10-06 DIAGNOSIS — Z789 Other specified health status: Secondary | ICD-10-CM | POA: Insufficient documentation

## 2021-10-06 DIAGNOSIS — M899 Disorder of bone, unspecified: Secondary | ICD-10-CM | POA: Insufficient documentation

## 2021-10-06 DIAGNOSIS — G894 Chronic pain syndrome: Secondary | ICD-10-CM | POA: Insufficient documentation

## 2021-10-06 DIAGNOSIS — M79601 Pain in right arm: Secondary | ICD-10-CM | POA: Diagnosis not present

## 2021-10-06 NOTE — Progress Notes (Signed)
Safety precautions to be maintained throughout the outpatient stay will include: orient to surroundings, keep bed in low position, maintain call bell within reach at all times, provide assistance with transfer out of bed and ambulation.  

## 2021-10-12 ENCOUNTER — Ambulatory Visit: Payer: 59 | Admitting: Occupational Therapy

## 2021-10-12 DIAGNOSIS — M79601 Pain in right arm: Secondary | ICD-10-CM

## 2021-10-12 DIAGNOSIS — M25631 Stiffness of right wrist, not elsewhere classified: Secondary | ICD-10-CM

## 2021-10-12 DIAGNOSIS — G5631 Lesion of radial nerve, right upper limb: Secondary | ICD-10-CM

## 2021-10-12 DIAGNOSIS — M6281 Muscle weakness (generalized): Secondary | ICD-10-CM

## 2021-10-12 NOTE — Therapy (Signed)
[facility] [address], [place], [address] Phone: [phone]   Fax:  [phone]  Occupational Therapy Treatment  Patient Details  Name: Rachel Hahn MRN: [id number] Date of Birth: November 29, 1956 Referring Provider (OT): lance Marney Doctor   Encounter Date: 10/12/2021   OT End of Session - 10/12/21 1126     Visit Number 6    Number of Visits 9    Date for OT Re-Evaluation 10/26/21    OT Start Time 1115    OT Stop Time 1200    OT Time Calculation (min) 45 min    Activity Tolerance Patient tolerated treatment well    Behavior During Therapy [facility] for tasks assessed/performed             Past Medical History:  Diagnosis Date   Hypertension    Kidney stones    Mitral valve prolapse     Past Surgical History:  Procedure Laterality Date   KIDNEY CYST REMOVAL     LITHOTRIPSY     TONSILLECTOMY      There were no vitals filed for this visit.   Subjective Assessment - 10/12/21 1122     Subjective  I had great time at the beach - I will admit I did not wear my splint and maybe done my exercises 1 x - pain worse in my forearm and elbow and up into my upper arm with every day activities - seen the Pain docter and he says going to refer me to neurologist    Pertinent History Rachel Hahn is a 65 y.o. female who presents today for evaluation of ongoing right arm pain. The patient began to experience right arm discomfort in March of this year. The patient denies any trauma or any injury affecting the right upper extremity. The patient is right-hand dominant and denies any surgical history to the shoulder, elbow or wrist. The patient has been experiencing pain along the dorsal aspect of the right forearm that can extend into the humerus and down into the right wrist in addition to pain along the volar aspect of the forearm as well. The patient states that the pain is constant all day, she does report occasional burning  discomfort. She states that any activity such as using the arm, lifting or reaching out creates increased discomfort. She denies any loss of range of motion to her shoulder or neck, she reports a 9 out of 10 pain score in the right upper extremity. She does have a history of breaking her right wrist when she was a child. She denies any numbness or tingling the right hand but does report some reduced grip strength. The patient was being evaluated by [facility] who performed several right lateral epicondyle steroid injections in addition to I believe what she is describing a trigger point injection. She also states that she did undergo a nerve conduction study which did not reveal any significant abnormalities, she also underwent x-rays which were negative for acute fracture or process. The patient has been started on several medications including muscle relaxers, anti-inflammatory medication all of which did not provide any significant relief. She did begin gabapentin which did significantly help with her pain however it caused her to be extremely drowsy. The patient was sent to a pain management specialist for questionable fibromyalgia however she states that the pain management specialist told her that she does not treat fibromyalgia. The patient then was following up with her primary care -physician who obtained  rheumatologic labs which were negative and referred her to me for second opinion. The patient is scheduled undergo a forearm MRI scan next week. She denies any changes in her symptoms since being evaluated by [facility]. Had shot for radial tunnel on 08/11/21 by DR Landry Mellow - refer to OT by Horris Latino PA -    Patient Stated Goals I just want the pain better in my R arm so I can use it    Currently in Pain? Yes    Pain Score 7     Pain Location Arm    Pain Orientation Right    Pain Descriptors / Indicators Aching;Shooting;Tender    Pain Type Chronic pain              Pt this  date with increase pain coming in after vacation over volar and dorsal forearm, into wrist and lateral epicondyle Upper traps bilateral tight  Rounded shoulders  And pain into upper arm  Pt report not wearing splint during vacation or doing her HEP             OT Treatments/Exercises (OP) - 10/12/21 0001       Moist Heat Therapy   Number Minutes Moist Heat 8 Minutes    Moist Heat Location --   R forearm ,elbow and upper traps prior to soft tissue            Pt  still with less pain pronation/ supination   pain with gentle stretch for flexors and extensors in forearm with elbow to side  And increase pain and tendernenss over volar /dorsal and lateral epincondyle    MRI showed normal elbow and forearm 2 -3 wks ago                       pt to cont with contrast to R forearm and wrist 2 x day - to decrease pain and can do to upper traps Wear wrist splint most all the time Soft tissue mobs to hand and volar/dorsal forearm , wrist prior to ROM  Supination in session painfree - AAROM end range And wrist flexion, ext stretch add with elbow to side     pt to do scapula retraction 10 reps  3  x day  And AAROM for shoulder flexion and ABD on wall- 10 reps after shower  And upper traps stretch 10 reps 1-2 x day          OT Education - 10/12/21 1126     Education Details changes to HEP    Person(s) Educated Patient    Methods Explanation;Demonstration;Tactile cues;Verbal cues;Handout    Comprehension Verbal cues required;Returned demonstration;Verbalized understanding                 OT Long Term Goals - 08/31/21 1224       OT LONG TERM GOAL #1   Title Pt to be independent in splint wearing and HEP to decrease pain to less than 4/10 at the worse    Baseline pain 2-9/10 with use- did wear splint less than 1-2 wks and not consistant    Time 3    Period Weeks    Status New    Target Date 09/21/21      OT LONG TERM GOAL #2   Title Pt show pain  less than 2/10 with R forearm and wrist AROM in all planes to use in ADL's with more ease    Baseline decrease wrist ext , pain  in RD, wrist flexion, sup /pro - and grip - pain 2-9/10 with use in ADl's    Time 6    Period Weeks    Status New    Target Date 10/12/21      OT LONG TERM GOAL #3   Title R UE AROM and strength increase to WNL to wean out of splint and use hand normall in ADL's without increase symptoms    Baseline Decrease AROM ext 55, pain with pron, sup, wrist flexion , RD- pain 2-9/10 with use - fitted with wrist splint to wear most all the time - off bathing ,dressing and HEP    Time 8    Period Weeks    Status New    Target Date 10/26/21                   Plan - 10/12/21 1136     Clinical Impression Statement Pt present with diagnosis of R radial N compression - Pt made progres up to 2 wks ago in pain and tenderness over dorsal R elbow, forearm and wrist - was able to do extended arm HEP but this date pt return after 2 wks being on vacation and pain and tenderness incease over dorsal and volar forearm into wrist and tenderness over lateral epicondyle. Pt report pain into uppearm - tightness in bilateral upper traps and rounded shoulders. Pt admit not wearing wrist splint over vacation or doing her HEP. Pt is R hand dominant. Pt to cont to  wear wrist splint most all the time . Add this date soft tissue mobs to upper traps and volar and dorsal forearm- gentle stretches with elbow to side to flexors and extensors. Scapula squeezes and shoulder flexion/ ABD AAROM. Pt to pay attention if pain is less during days, smaller area or not as intense or often -  explain for pt purpose of splint, contrast to decrease pain and tenderness over forearm and wrist  - increase pain with decrease ROM and strength all limiting her functional use of R dominant hand    OT Occupational Profile and History Problem Focused Assessment - Including review of records relating to presenting problem     Occupational performance deficits (Please refer to evaluation for details): ADL's;IADL's;Work;Play;Leisure;Social Participation    Body Structure / Function / Physical Skills ADL;Decreased knowledge of use of DME;Strength;Pain;UE functional use;ROM;IADL;Decreased knowledge of precautions;Flexibility    Rehab Potential Fair    Clinical Decision Making Limited treatment options, no task modification necessary    Comorbidities Affecting Occupational Performance: None    Modification or Assistance to Complete Evaluation  No modification of tasks or assist necessary to complete eval    OT Frequency 1x / week    OT Duration 8 weeks    OT Treatment/Interventions Self-care/ADL training;Ultrasound;Contrast Bath;Therapeutic exercise;Manual Therapy;Patient/family education;Passive range of motion;Therapeutic activities;Splinting;DME and/or AE instruction    Consulted and Agree with Plan of Care Patient             Patient will benefit from skilled therapeutic intervention in order to improve the following deficits and impairments:   Body Structure / Function / Physical Skills: ADL, Decreased knowledge of use of DME, Strength, Pain, UE functional use, ROM, IADL, Decreased knowledge of precautions, Flexibility       Visit Diagnosis: Muscle weakness (generalized)  Stiffness of right wrist, not elsewhere classified  Radial nerve compression, right  Pain in right arm    Problem List Patient Active Problem List   Diagnosis Date Noted  Lateral antebrachial cutaneous nerve injury, sequela (Right) 10/06/2021   Right forearm pain 10/06/2021   Nerve entrapment syndrome of arm (Right) 10/06/2021   Chronic pain syndrome 10/05/2021   Pharmacologic therapy 10/05/2021   Disorder of skeletal system 10/05/2021   Problems influencing health status 10/05/2021   Encounter for chronic pain management 10/05/2021   Chronic upper extremity pain (1ry area of Pain) (Right) 03/05/2021   Chest pain  05/17/2017   Accelerated hypertension 05/17/2017    Oletta Cohn, OTR/L,CLT 10/12/2021, 1:28 PM  [facility] [address] [place], [address] Phone: [phone]   Fax:  [phone]  Name: Rachel Hahn MRN: [id number] Date of Birth: 03/06/1956

## 2021-10-19 ENCOUNTER — Ambulatory Visit: Payer: 59 | Attending: Student | Admitting: Occupational Therapy

## 2021-10-19 ENCOUNTER — Other Ambulatory Visit: Payer: Self-pay

## 2021-10-19 DIAGNOSIS — M25631 Stiffness of right wrist, not elsewhere classified: Secondary | ICD-10-CM | POA: Insufficient documentation

## 2021-10-19 DIAGNOSIS — G5631 Lesion of radial nerve, right upper limb: Secondary | ICD-10-CM | POA: Diagnosis present

## 2021-10-19 DIAGNOSIS — M6281 Muscle weakness (generalized): Secondary | ICD-10-CM | POA: Insufficient documentation

## 2021-10-19 DIAGNOSIS — M79601 Pain in right arm: Secondary | ICD-10-CM | POA: Diagnosis present

## 2021-10-19 NOTE — Therapy (Signed)
[facility] [address] [place], [address] Phone: [phone]   Fax:  [phone]  Occupational Therapy Treatment  Patient Details  Name: Rachel Hahn MRN: [id number] Date of Birth: 05/07/1956 Referring Provider (OT): lance Marney Doctor   Encounter Date: 10/19/2021   OT End of Session - 10/19/21 0952     Visit Number 7    Number of Visits 9    Date for OT Re-Evaluation 10/26/21    OT Start Time 0947    OT Stop Time 1030    OT Time Calculation (min) 43 min    Activity Tolerance Patient tolerated treatment well    Behavior During Therapy [facility] for tasks assessed/performed             Past Medical History:  Diagnosis Date   Hypertension    Kidney stones    Mitral valve prolapse     Past Surgical History:  Procedure Laterality Date   KIDNEY CYST REMOVAL     LITHOTRIPSY     TONSILLECTOMY      There were no vitals filed for this visit.   Subjective Assessment - 10/19/21 0953     Subjective  My arm is the same -not better  the whole weekend feel like I wanted to just go to the hospital - pain in forearm and wrist into hand and laying my elbow down it is just so tender    Pertinent History Rachel Hahn is a 65 y.o. female who presents today for evaluation of ongoing right arm pain. The patient began to experience right arm discomfort in March of this year. The patient denies any trauma or any injury affecting the right upper extremity. The patient is right-hand dominant and denies any surgical history to the shoulder, elbow or wrist. The patient has been experiencing pain along the dorsal aspect of the right forearm that can extend into the humerus and down into the right wrist in addition to pain along the volar aspect of the forearm as well. The patient states that the pain is constant all day, she does report occasional burning discomfort. She states that any activity such as using the arm, lifting or  reaching out creates increased discomfort. She denies any loss of range of motion to her shoulder or neck, she reports a 9 out of 10 pain score in the right upper extremity. She does have a history of breaking her right wrist when she was a child. She denies any numbness or tingling the right hand but does report some reduced grip strength. The patient was being evaluated by [facility] who performed several right lateral epicondyle steroid injections in addition to I believe what she is describing a trigger point injection. She also states that she did undergo a nerve conduction study which did not reveal any significant abnormalities, she also underwent x-rays which were negative for acute fracture or process. The patient has been started on several medications including muscle relaxers, anti-inflammatory medication all of which did not provide any significant relief. She did begin gabapentin which did significantly help with her pain however it caused her to be extremely drowsy. The patient was sent to a pain management specialist for questionable fibromyalgia however she states that the pain management specialist told her that she does not treat fibromyalgia. The patient then was following up with her primary care -physician who obtained rheumatologic labs which were negative and referred her to me for second opinion. The patient is scheduled  undergo a forearm MRI scan next week. She denies any changes in her symptoms since being evaluated by [facility]. Had shot for radial tunnel on 08/11/21 by DR Landry Mellow - refer to OT by Horris Latino PA -    Patient Stated Goals I just want the pain better in my R arm so I can use it    Currently in Pain? Yes    Pain Score 5     Pain Location Arm    Pain Orientation Right    Pain Descriptors / Indicators Aching;Sharp;Tender;Throbbing    Pain Type Chronic pain    Pain Onset More than a month ago    Pain Frequency Constant                [facility] OT  Assessment - 10/19/21 0001       AROM   Right Forearm Supination 90 Degrees   pain coming in first few , but later WNL no pain   Right Wrist Extension 60 Degrees    Right Wrist Flexion 95 Degrees    Right Wrist Radial Deviation --   WNL no pain   Right Wrist Ulnar Deviation --   WNL no pain     Strength   Right Hand Grip (lbs) 36   26 exended arm   Right Hand Lateral Pinch 13 lbs    Right Hand 3 Point Pinch 10 lbs    Left Hand Grip (lbs) 36   extended arm 36   Left Hand Lateral Pinch 15 lbs    Left Hand 3 Point Pinch 12 lbs             Pt with increase AROM and grip /prehension strength compare to eval          OT Treatments/Exercises (OP) - 10/19/21 0001       Moist Heat Therapy   Number Minutes Moist Heat 8 Minutes    Moist Heat Location --   forearm prior to stretches     RUE Contrast Bath   Time 8 minutes    Comments end of session                Pt had no pain with supination in session  Increase wrist  flexion- with less pain with elbow to side and extended arm  But cont increase discomfort with wrist extention because of tightness and trigger points over volar forearm flexors   And since last week - and after vacation - tender at lateral epincondyle  Tenderness over dorsal  forearm  but no Tinel at wrist or radial N   MRI showed normal elbow and forearm                        pt to cont heat/ or contrast  to R forearm and wrist 2 x day - to decrease pain  Wear wrist splint most all the time Soft tissue mobs to hand and volar forearm and lateral epicondyle this date  - pt to focus on volar forearm soft tissue at home  Supination /pronation in session painfree - AAROM end range And wrist flexion, ext, RD, UD AAROM with elbow to side and extended arm       but keep hand open and not AROM - do AAROM / PROM gentle with elbow to side -for flexors and extensors - 5 reps hold 5 sec - neutral and pronation position    Try to use L hand more  and keep R hand pain free -avoid pushing , pulling  and repettive gripping  R Upper arm pain better  and tightness in upper traps better        OT Education - 10/19/21 0951     Education Details changes to HEP    Person(s) Educated Patient    Methods Explanation;Demonstration;Tactile cues;Verbal cues;Handout    Comprehension Verbal cues required;Returned demonstration;Verbalized understanding                 OT Long Term Goals - 08/31/21 1224       OT LONG TERM GOAL #1   Title Pt to be independent in splint wearing and HEP to decrease pain to less than 4/10 at the worse    Baseline pain 2-9/10 with use- did wear splint less than 1-2 wks and not consistant    Time 3    Period Weeks    Status New    Target Date 09/21/21      OT LONG TERM GOAL #2   Title Pt show pain less than 2/10 with R forearm and wrist AROM in all planes to use in ADL's with more ease    Baseline decrease wrist ext , pain in RD, wrist flexion, sup /pro - and grip - pain 2-9/10 with use in ADl's    Time 6    Period Weeks    Status New    Target Date 10/12/21      OT LONG TERM GOAL #3   Title R UE AROM and strength increase to WNL to wean out of splint and use hand normall in ADL's without increase symptoms    Baseline Decrease AROM ext 55, pain with pron, sup, wrist flexion , RD- pain 2-9/10 with use - fitted with wrist splint to wear most all the time - off bathing ,dressing and HEP    Time 8    Period Weeks    Status New    Target Date 10/26/21                   Plan - 10/19/21 9826     Clinical Impression Statement Pt present with diagnosis of R radial N compression - Pt made progres up to 3  wks ago in pain and tenderness over dorsal R elbow, forearm and wrist - was able to do extended arm HEP but last week she returned after 2 wks being on vacation and pain and tenderness incease over dorsal and volar forearm into wrist and tenderness over lateral epicondyle. Pt report pain into  uppearm last week too - tightness in bilateral upper traps and rounded shoulders. this date upper arm pain decrease - Pt present this date with increase grip and prehension strenght compare to eval and AROM - but cont to have pain in lateral epicondyle and more tenderness and tightness on volar forearm and pronator- extensors and supinator better- pt do use her hand a lot at home pushing , pulling , lifting - she do report taking her dog to the beach - had him on leash and also in stroller and pushed it- She did admit not wearing wrist splint over vacation or doing her HEP. Pt is R hand dominant. Pt to cont to  wear wrist splint most all the time . Review again stretches for volar and dorsal forearm- gentle stretches with elbow to side to flexors and extensors. Scapula squeezes and shoulder flexion/ ABD AAROM to cont. Pt to pay attention if pain is less during  days, smaller area or not as intense or often -  explain for pt purpose of splint,  heat  or contrast to decrease pain and tenderness over forearm and wrist  - Cont to show increase pain with decrease ROM and strength all limiting her functional use of R dominant hand    OT Occupational Profile and History Problem Focused Assessment - Including review of records relating to presenting problem    Occupational performance deficits (Please refer to evaluation for details): ADL's;IADL's;Work;Play;Leisure;Social Participation    Body Structure / Function / Physical Skills ADL;Decreased knowledge of use of DME;Strength;Pain;UE functional use;ROM;IADL;Decreased knowledge of precautions;Flexibility    Rehab Potential Fair    Clinical Decision Making Limited treatment options, no task modification necessary    Comorbidities Affecting Occupational Performance: None    Modification or Assistance to Complete Evaluation  No modification of tasks or assist necessary to complete eval    OT Frequency 1x / week    OT Duration 6 weeks    OT Treatment/Interventions  Self-care/ADL training;Ultrasound;Contrast Bath;Therapeutic exercise;Manual Therapy;Patient/family education;Passive range of motion;Therapeutic activities;Splinting;DME and/or AE instruction    Consulted and Agree with Plan of Care Patient             Patient will benefit from skilled therapeutic intervention in order to improve the following deficits and impairments:   Body Structure / Function / Physical Skills: ADL, Decreased knowledge of use of DME, Strength, Pain, UE functional use, ROM, IADL, Decreased knowledge of precautions, Flexibility       Visit Diagnosis: Muscle weakness (generalized)  Stiffness of right wrist, not elsewhere classified  Radial nerve compression, right  Pain in right arm    Problem List Patient Active Problem List   Diagnosis Date Noted   Lateral antebrachial cutaneous nerve injury, sequela (Right) 10/06/2021   Right forearm pain 10/06/2021   Nerve entrapment syndrome of arm (Right) 10/06/2021   Chronic pain syndrome 10/05/2021   Pharmacologic therapy 10/05/2021   Disorder of skeletal system 10/05/2021   Problems influencing health status 10/05/2021   Encounter for chronic pain management 10/05/2021   Chronic upper extremity pain (1ry area of Pain) (Right) 03/05/2021   Chest pain 05/17/2017   Accelerated hypertension 05/17/2017    Oletta Cohn, OTR/L CLT 10/19/2021, 5:07 PM  [place] [address] [place], [address] Phone: [phone]   Fax:  [phone]  Name: Rachel Hahn MRN: [id number] Date of Birth: 1956-02-10

## 2021-10-29 ENCOUNTER — Ambulatory Visit: Payer: 59 | Admitting: Occupational Therapy

## 2021-11-05 ENCOUNTER — Ambulatory Visit: Payer: 59 | Admitting: Occupational Therapy

## 2021-11-05 ENCOUNTER — Other Ambulatory Visit: Payer: Self-pay

## 2021-11-05 DIAGNOSIS — M6281 Muscle weakness (generalized): Secondary | ICD-10-CM

## 2021-11-05 DIAGNOSIS — M25631 Stiffness of right wrist, not elsewhere classified: Secondary | ICD-10-CM

## 2021-11-05 DIAGNOSIS — G5631 Lesion of radial nerve, right upper limb: Secondary | ICD-10-CM

## 2021-11-05 DIAGNOSIS — M79601 Pain in right arm: Secondary | ICD-10-CM

## 2021-11-05 NOTE — Therapy (Signed)
[facility] [address], [place], [address] Phone: [phone]   Fax:  [phone]  Occupational Therapy Treatment  Patient Details  Name: Rachel Hahn MRN: [id number] Date of Birth: [birth year] Referring Provider (OT): lance Mikle Bosworth   Encounter Date: 11/05/2021   OT End of Session - 11/05/21 1000     Visit Number 8    Number of Visits 8    Date for OT Re-Evaluation 11/05/21    OT Start Time 0951    OT Stop Time 1033    OT Time Calculation (min) 42 min    Activity Tolerance Patient tolerated treatment well    Behavior During Therapy [facility] for tasks assessed/performed             Past Medical History:  Diagnosis Date   Hypertension    Kidney stones    Mitral valve prolapse     Past Surgical History:  Procedure Laterality Date   KIDNEY CYST REMOVAL     LITHOTRIPSY     TONSILLECTOMY      There were no vitals filed for this visit.   Subjective Assessment - 11/05/21 0957     Subjective  Pain is more constant in the R arm in the top and inside of forearm and into my elbow - to my hand - did get appt with neurosurgeon for the 13th Dec - not really numbness -more pain    Pertinent History Rachel Hahn is a 65 y.o. female who presents today for evaluation of ongoing right arm pain. The patient began to experience right arm discomfort in March of this year. The patient denies any trauma or any injury affecting the right upper extremity. The patient is right-hand dominant and denies any surgical history to the shoulder, elbow or wrist. The patient has been experiencing pain along the dorsal aspect of the right forearm that can extend into the humerus and down into the right wrist in addition to pain along the volar aspect of the forearm as well. The patient states that the pain is constant all day, she does report occasional burning discomfort. She states that any activity such as using the arm, lifting or  reaching out creates increased discomfort. She denies any loss of range of motion to her shoulder or neck, she reports a 9 out of 10 pain score in the right upper extremity. She does have a history of breaking her right wrist when she was a child. She denies any numbness or tingling the right hand but does report some reduced grip strength. The patient was being evaluated by [facility] who performed several right lateral epicondyle steroid injections in addition to I believe what she is describing a trigger point injection. She also states that she did undergo a nerve conduction study which did not reveal any significant abnormalities, she also underwent x-rays which were negative for acute fracture or process. The patient has been started on several medications including muscle relaxers, anti-inflammatory medication all of which did not provide any significant relief. She did begin gabapentin which did significantly help with her pain however it caused her to be extremely drowsy. The patient was sent to a pain management specialist for questionable fibromyalgia however she states that the pain management specialist told her that she does not treat fibromyalgia. The patient then was following up with her primary care -physician who obtained rheumatologic labs which were negative and referred her to me for second opinion. The patient is scheduled  undergo a forearm MRI scan next week. She denies any changes in her symptoms since being evaluated by [facility]. Had shot for radial tunnel on 08/11/21 by DR Candelaria Stagers - refer to OT by Cameron Proud PA -    Patient Stated Goals I just want the pain better in my R arm so I can use it    Currently in Pain? Yes    Pain Score 8     Pain Orientation Right    Pain Descriptors / Indicators Aching;Tender    Pain Type Acute pain;Chronic pain    Pain Frequency Constant                [facility] OT Assessment - 11/05/21 0001       AROM   Right Forearm  Supination 90 Degrees    Left Forearm Pronation 90 Degrees    Right Wrist Extension 55 Degrees   pain dorsal and volar   Right Wrist Flexion 90 Degrees    Right Wrist Radial Deviation 15 Degrees   pain   Right Wrist Ulnar Deviation 40 Degrees   pain free     Strength   Right Hand Grip (lbs) 30   extended arm   Right Hand Lateral Pinch 12 lbs    Right Hand 3 Point Pinch 11 lbs   pain   Left Hand Grip (lbs) 36   extended arm 36   Left Hand Lateral Pinch 14 lbs    Left Hand 3 Point Pinch 12 lbs      Right Hand AROM   R Thumb MCP 0-60 60 Degrees    R Thumb IP 0-80 35 Degrees    R Thumb Radial ABduction/ADduction 0-55 40   pain dorsal hand   R Thumb Palmar ABduction/ADduction 0-45 50   pain dorsal hand   R Index  MCP 0-90 75 Degrees    R Index PIP 0-100 90 Degrees    R Long  MCP 0-90 80 Degrees    R Long PIP 0-100 90 Degrees    R Ring  MCP 0-90 80 Degrees    R Ring PIP 0-100 90 Degrees    R Little  MCP 0-90 90 Degrees    R Little PIP 0-100 80 Degrees              Measurements taken - and review again with pt some gentle stretches for forearm flexors and extensors after moist heat - wear wrist splint as needed  AROM sup /pro And scapula squeezes         OT Treatments/Exercises (OP) - 11/05/21 0001       Moist Heat Therapy   Number Minutes Moist Heat 8 Minutes    Moist Heat Location --   end of session decrease pain, increase AAROM wrist and hand                   OT Education - 11/05/21 1000     Education Details changes to HEP    Person(s) Educated Patient    Methods Explanation;Demonstration;Tactile cues;Verbal cues;Handout    Comprehension Verbal cues required;Returned demonstration;Verbalized understanding                 OT Long Term Goals - 11/05/21 1045       OT LONG TERM GOAL #1   Title Pt to be independent in splint wearing and HEP to decrease pain to less than 4/10 at the worse    Baseline pain 2-9/10 with use- did wear  splint  less than 1-2 wks and not consistant- NOW cont to have pain increase at rest 8-9/10    Status Not Met      OT LONG TERM GOAL #2   Title Pt show pain less than 2/10 with R forearm and wrist AROM in all planes to use in ADL's with more ease    Baseline decrease wrist ext , pain in RD, wrist flexion, sup /pro - and grip - pain 2-9/10 with use in ADl's    Status Not Met      OT LONG TERM GOAL #3   Title R UE AROM and strength increase to WNL to wean out of splint and use hand normall in ADL's without increase symptoms    Baseline Decrease AROM ext 55, pain with pron, sup, wrist flexion , RD- pain 2-9/10 with use - fitted with wrist splint to wear most all the time - off bathing ,dressing and HEP pain cont    Status Not Met                   Plan - 11/05/21 1047     Clinical Impression Statement Pt present with diagnosis of R radial N compression - Pt made progres  at [facility] in pain and tenderness over dorsal R elbow, forearm and wrist with increase AROM - was able to do extended arm HEP but after she was at the beach for few days - she returned with increase pain and tenderness over dorsal and volar forearm into wrist and tenderness over lateral epicondyle. Pt report pain into uppearm at times. Denies numbness. Pt maintain her grip and prehension strength and AROM  compare to eval - but cont to have pain in lateral epicondyle now and more tenderness and tightness in dorsal and volar forearm. Pronation and supinator better. Took this session measurements for baseline if having surgery in the future and done moist heat at the end with review of stretches for volar and dorsal forearm- gentle stretches with elbow to side to flexors and extensors. Pt to cont wtih scapula squeezes and shoulder flexion/ ABD AAROM to cont. Pt refer by MD to neurosurgeon - appt on 13th of Dec. pt on hold - pt to call if need to return or refer back.    OT Occupational Profile and History Problem Focused Assessment -  Including review of records relating to presenting problem    Occupational performance deficits (Please refer to evaluation for details): ADL's;IADL's;Work;Play;Leisure;Social Participation    Body Structure / Function / Physical Skills ADL;Decreased knowledge of use of DME;Strength;Pain;UE functional use;ROM;IADL;Decreased knowledge of precautions;Flexibility    Rehab Potential Fair    Clinical Decision Making Limited treatment options, no task modification necessary    Comorbidities Affecting Occupational Performance: None    Modification or Assistance to Complete Evaluation  No modification of tasks or assist necessary to complete eval    OT Frequency 1x / week    OT Duration --   1 wk   OT Treatment/Interventions Self-care/ADL training;Ultrasound;Contrast Bath;Therapeutic exercise;Manual Therapy;Patient/family education;Passive range of motion;Therapeutic activities;Splinting;DME and/or AE instruction    Consulted and Agree with Plan of Care Patient             Patient will benefit from skilled therapeutic intervention in order to improve the following deficits and impairments:   Body Structure / Function / Physical Skills: ADL, Decreased knowledge of use of DME, Strength, Pain, UE functional use, ROM, IADL, Decreased knowledge of precautions, Flexibility  Visit Diagnosis: Muscle weakness (generalized) - Plan: Ot plan of care cert/re-cert  Stiffness of right wrist, not elsewhere classified - Plan: Ot plan of care cert/re-cert  Radial nerve compression, right - Plan: Ot plan of care cert/re-cert  Pain in right arm - Plan: Ot plan of care cert/re-cert    Problem List Patient Active Problem List   Diagnosis Date Noted   Lateral antebrachial cutaneous nerve injury, sequela (Right) 10/06/2021   Right forearm pain 10/06/2021   Nerve entrapment syndrome of arm (Right) 10/06/2021   Chronic pain syndrome 10/05/2021   Pharmacologic therapy 10/05/2021   Disorder of skeletal  system 10/05/2021   Problems influencing health status 10/05/2021   Encounter for chronic pain management 10/05/2021   Chronic upper extremity pain (1ry area of Pain) (Right) 03/05/2021   Chest pain 05/17/2017   Accelerated hypertension 05/17/2017    Rosalyn Gess, OTR/L,CLT 11/05/2021, 10:54 AM  [facility] [address] [place], [address] Phone: [phone]   Fax:  [phone]  Name: Amrita Radu MRN: [id number] Date of Birth: August 10, 1956

## 2021-11-10 ENCOUNTER — Other Ambulatory Visit: Payer: Self-pay | Admitting: Student

## 2021-11-10 DIAGNOSIS — G5630 Lesion of radial nerve, unspecified upper limb: Secondary | ICD-10-CM

## 2021-11-10 DIAGNOSIS — M4802 Spinal stenosis, cervical region: Secondary | ICD-10-CM

## 2021-11-10 DIAGNOSIS — M5412 Radiculopathy, cervical region: Secondary | ICD-10-CM

## 2021-11-10 DIAGNOSIS — G8929 Other chronic pain: Secondary | ICD-10-CM

## 2021-11-18 DIAGNOSIS — G5631 Lesion of radial nerve, right upper limb: Secondary | ICD-10-CM

## 2021-11-18 HISTORY — DX: Lesion of radial nerve, right upper limb: G56.31

## 2021-11-25 ENCOUNTER — Ambulatory Visit
Admission: RE | Admit: 2021-11-25 | Discharge: 2021-11-25 | Disposition: A | Discharge status: Home / Self Care | Payer: 59 | Source: Ambulatory Visit | Attending: Student | Admitting: Student

## 2021-11-25 ENCOUNTER — Other Ambulatory Visit: Payer: Self-pay

## 2021-11-25 DIAGNOSIS — G5630 Lesion of radial nerve, unspecified upper limb: Secondary | ICD-10-CM | POA: Diagnosis present

## 2021-11-25 DIAGNOSIS — M79601 Pain in right arm: Secondary | ICD-10-CM | POA: Diagnosis present

## 2021-11-25 DIAGNOSIS — M4802 Spinal stenosis, cervical region: Secondary | ICD-10-CM | POA: Diagnosis present

## 2021-11-25 DIAGNOSIS — M5412 Radiculopathy, cervical region: Secondary | ICD-10-CM | POA: Diagnosis present

## 2021-11-25 DIAGNOSIS — G8929 Other chronic pain: Secondary | ICD-10-CM | POA: Diagnosis present

## 2021-11-25 IMAGING — MR MR CERVICAL SPINE W/O CM
5 series · 37 of 48 positions shown · non-contrast
Comparison: Report from cervical spine MRI [DATE] (images
unavailable).

CLINICAL DATA: Provided history: Cervical radiculopathy. Spinal
stenosis of cervical region. Radial tunnel syndrome. Chronic pain of
right upper extremity. Additional history provided by scanning
technologist: Patient reports pain in right arm, especially when
reaching, symptoms for 1 year.

EXAM:
MRI CERVICAL SPINE WITHOUT CONTRAST
TECHNIQUE: Multiplanar, multisequence MR imaging of the cervical spine was
performed. No intravenous contrast was administered.

[Series 5: T2 · sagittal · 3.0mm · 0.62mm/px · 8 of 15 slices shown (1 of 2)]
[im 1/15]
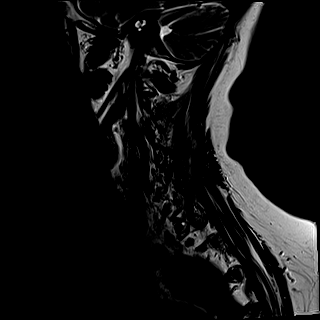
[im 3/15]
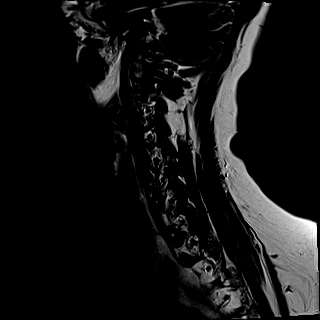
[im 5/15]
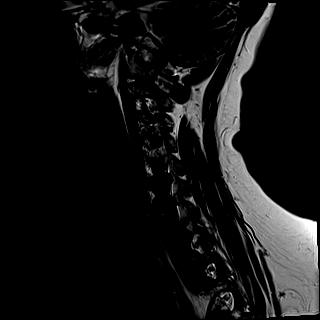
[im 7/15]
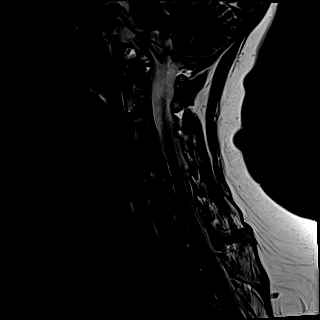
[im 9/15]
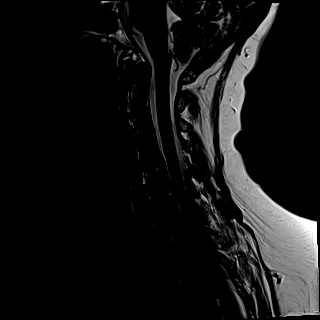
[im 11/15]
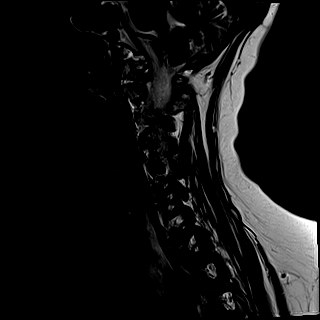
[im 13/15]
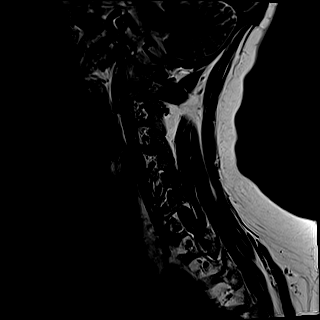
[im 15/15]
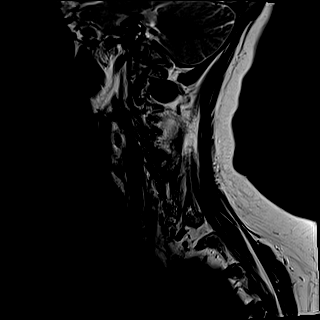

[Series 6: FLAIR · sagittal · 3.0mm · 0.78mm/px · 7 of 15 slices shown]
[im 1/15]
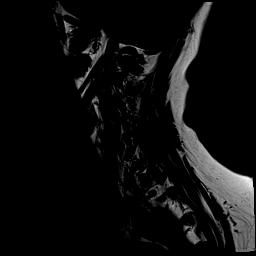
[im 3/15]
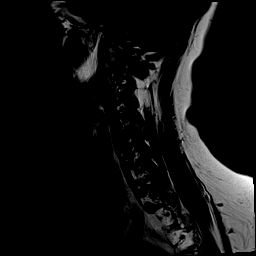
[im 5/15]
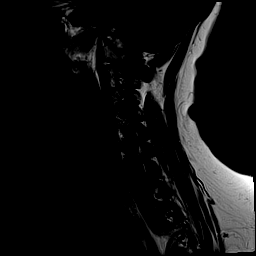
[im 8/15]
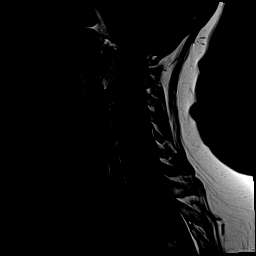
[im 10/15]
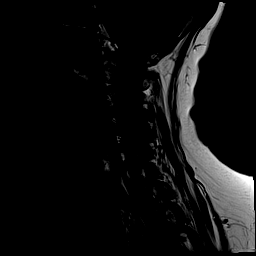
[im 12/15]
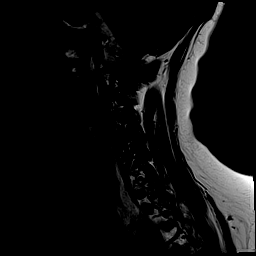
[im 15/15]
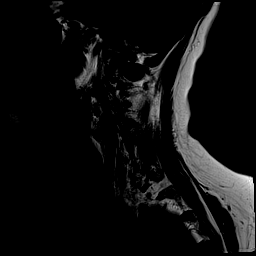

[Series 7: STIR · sagittal · 3.0mm · 0.62mm/px · 7 of 15 slices shown]
[im 1/15]
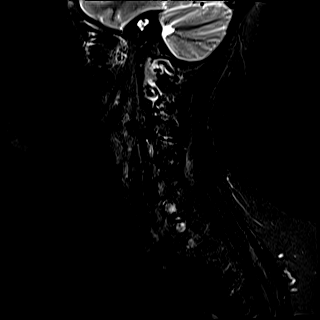
[im 3/15]
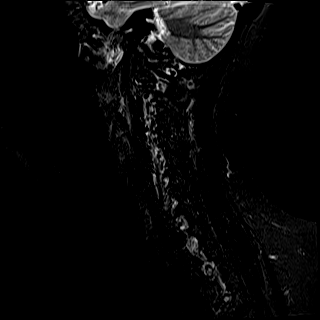
[im 5/15]
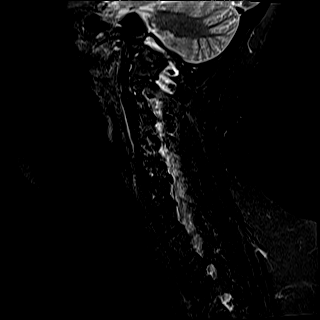
[im 8/15]
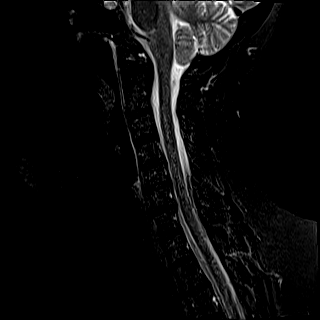
[im 10/15]
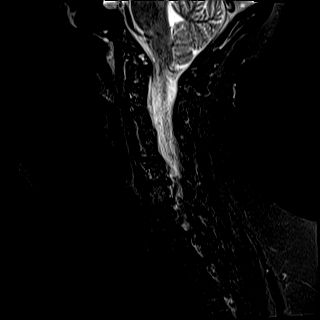
[im 12/15]
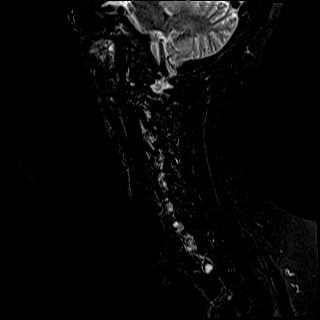
[im 15/15]
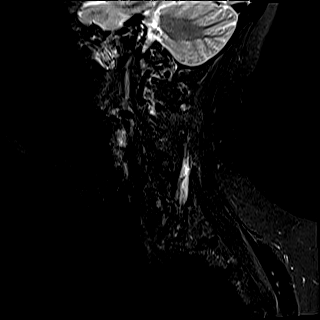

[Series 8: T2 · axial · 3.0mm · 0.70mm/px · z∈[-128,-37]mm · 9 of 28 slices shown (2 of 2)]
[im 1/28]
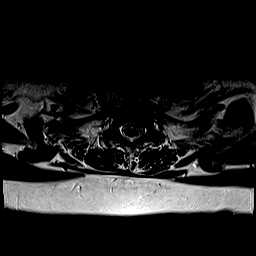
[im 5/28]
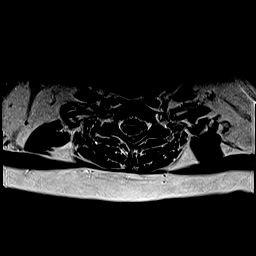
[im 10/28]
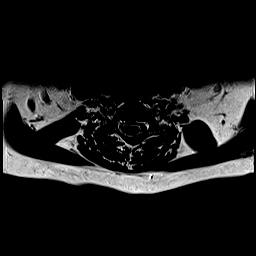
[im 12/28]
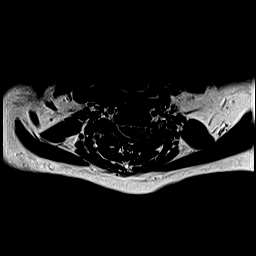
[im 14/28]
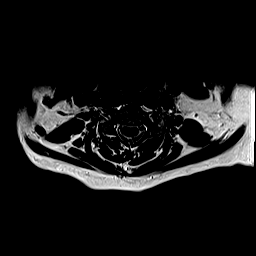
[im 16/28]
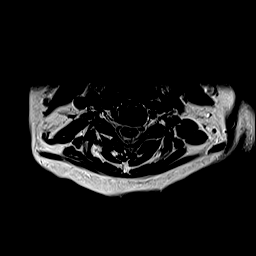
[im 19/28]
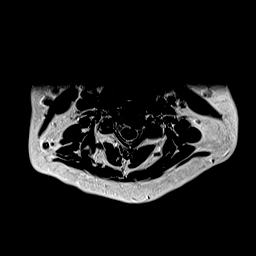
[im 23/28]
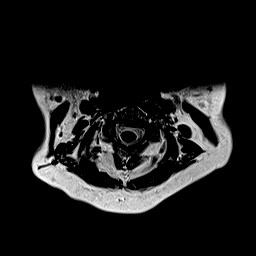
[im 28/28]
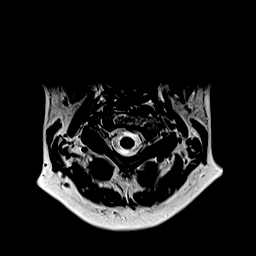

[Series 9: ax mpgr · axial · 3.0mm · 0.35mm/px · z∈[-128,-68]mm · 6 of 28 slices shown]
[im 1/28]
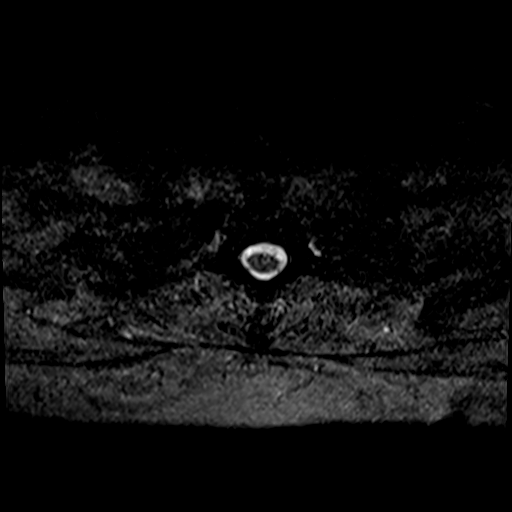
[im 5/28]
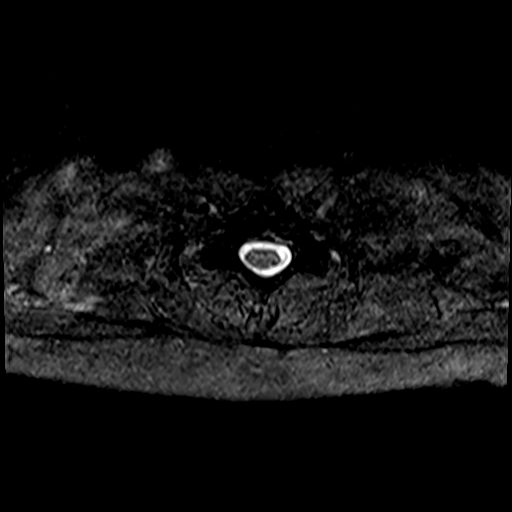
[im 10/28]
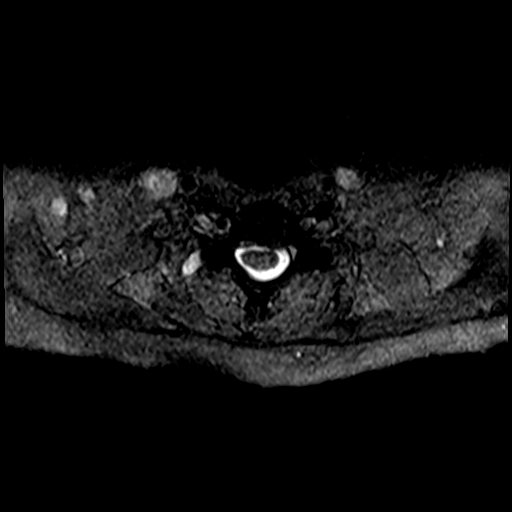
[im 12/28]
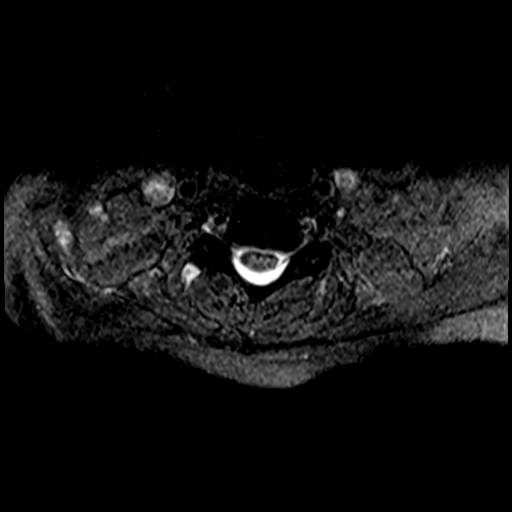
[im 16/28]
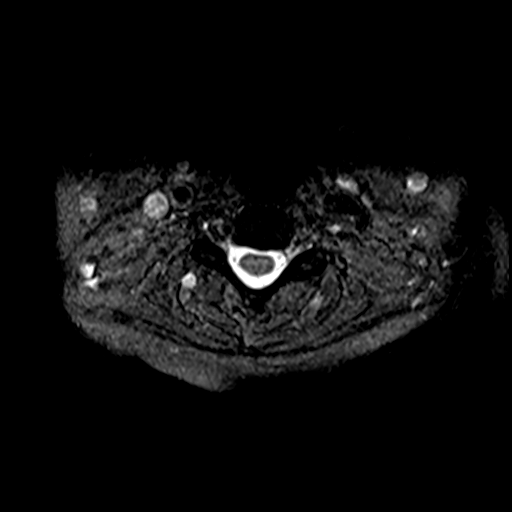
[im 19/28]
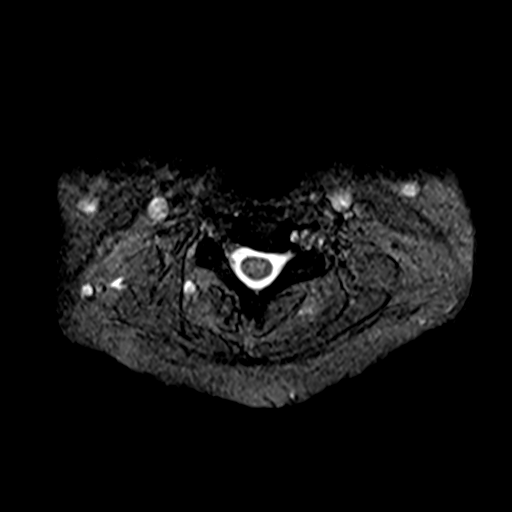

[37 of 48 positions shown; findings below may reference images not displayed]

FINDINGS: Intermittently motion degraded examination. Most notably, there is
moderate motion degradation of the sagittal STIR sequence.

Alignment: Straightening of the expected cervical lordosis. Trace
C4-C5 grade 1 anterolisthesis.

Vertebrae: Vertebral body height is maintained. Multilevel mixed
degenerative endplate marrow signal, including trace multilevel
degenerative endplate edema. No significant marrow edema or focal
suspicious osseous lesion identified elsewhere.

Cord: No signal abnormality identified within the cervical spinal
cord.

Posterior Fossa, vertebral arteries, paraspinal tissues: No
abnormality identified within included portions of the posterior
fossa. Flow voids preserved within the imaged cervical vertebral
arteries. Paraspinal soft tissues unremarkable.

Disc levels:

Multilevel disc degeneration, greatest at C5-C6 and C6-C7 (mild to
moderate at these levels).

C2-C3: Minimal facet arthrosis on the right. No significant disc
herniation or stenosis.

C3-C4: Shallow disc bulge. Minimal uncovertebral hypertrophy
(greater on the left). No significant spinal canal or foraminal
stenosis.

C4-C5: Trace grade 1 anterolisthesis. Slight disc uncovering and
disc bulge. No significant spinal canal or foraminal stenosis. The
right vertebral artery is tortuous at this level and extends
slightly into the right neural foramen.

C5-C6: Disc bulge with bilateral disc osteophyte ridge/uncinate
hypertrophy. Minimal partial effacement of the ventral thecal sac
(without significant spinal cord mass effect). Bilateral neural
foraminal narrowing (moderate/severe right, moderate left).

C6-C7: Disc bulge with right-sided disc osteophyte ridge/uncinate
hypertrophy. Minimal uncovertebral hypertrophy also present on the
left. Minimal partial effacement of the ventral thecal sac (without
significant spinal cord mass effect). Bilateral neural foraminal
narrowing (moderate right, mild left).

C7-T1: No significant disc herniation or stenosis.
IMPRESSION: Cervical spondylosis, as outlined and with findings most notably as
follows.

At C5-C6, there is mild/moderate disc degeneration. Disc bulge with
bilateral disc osteophyte ridge/uncinate hypertrophy. Bilateral
neural foraminal narrowing (moderate/severe right, moderate left).
Minimal partial effacement of the ventral thecal sac without
significant spinal cord mass effect.

At C6-C7, there is mild/moderate disc degeneration. Disc bulge with
right-sided disc osteophyte ridge/uncinate hypertrophy. Minimal
uncovertebral hypertrophy also present on the left. Bilateral neural
foraminal narrowing (moderate right, mild left). Minimal partial
effacement of the ventral thecal sac without significant spinal cord
mass effect.

No significant spinal canal or foraminal stenosis at the remaining
levels.

Straightening of the expected cervical lordosis.

Trace C4-C5 grade 1 anterolisthesis.

## 2021-12-27 DIAGNOSIS — G5691 Unspecified mononeuropathy of right upper limb: Secondary | ICD-10-CM | POA: Diagnosis not present

## 2021-12-27 DIAGNOSIS — I341 Nonrheumatic mitral (valve) prolapse: Secondary | ICD-10-CM | POA: Diagnosis not present

## 2021-12-27 DIAGNOSIS — G5631 Lesion of radial nerve, right upper limb: Secondary | ICD-10-CM | POA: Diagnosis not present

## 2021-12-29 DIAGNOSIS — M7711 Lateral epicondylitis, right elbow: Secondary | ICD-10-CM | POA: Diagnosis not present

## 2021-12-29 DIAGNOSIS — R69 Illness, unspecified: Secondary | ICD-10-CM | POA: Diagnosis not present

## 2021-12-29 DIAGNOSIS — Z79899 Other long term (current) drug therapy: Secondary | ICD-10-CM | POA: Diagnosis not present

## 2021-12-29 DIAGNOSIS — G5631 Lesion of radial nerve, right upper limb: Secondary | ICD-10-CM | POA: Diagnosis not present

## 2021-12-29 DIAGNOSIS — I10 Essential (primary) hypertension: Secondary | ICD-10-CM | POA: Diagnosis not present

## 2021-12-29 HISTORY — PX: ELBOW ARTHROPLASTY: SHX928

## 2022-01-17 ENCOUNTER — Other Ambulatory Visit: Payer: Self-pay

## 2022-01-17 ENCOUNTER — Ambulatory Visit: Payer: 59 | Attending: Student | Admitting: Occupational Therapy

## 2022-01-17 ENCOUNTER — Encounter: Payer: Self-pay | Admitting: Occupational Therapy

## 2022-01-17 DIAGNOSIS — M25621 Stiffness of right elbow, not elsewhere classified: Secondary | ICD-10-CM | POA: Insufficient documentation

## 2022-01-17 DIAGNOSIS — M25631 Stiffness of right wrist, not elsewhere classified: Secondary | ICD-10-CM | POA: Diagnosis not present

## 2022-01-17 DIAGNOSIS — L905 Scar conditions and fibrosis of skin: Secondary | ICD-10-CM | POA: Insufficient documentation

## 2022-01-17 DIAGNOSIS — M6281 Muscle weakness (generalized): Secondary | ICD-10-CM | POA: Insufficient documentation

## 2022-01-17 DIAGNOSIS — M79601 Pain in right arm: Secondary | ICD-10-CM | POA: Insufficient documentation

## 2022-01-17 NOTE — Therapy (Signed)
[facility] [address] [place], [address] Phone: [phone]   Fax:  [phone]  Occupational Therapy Evaluation  Patient Details  Name: Rachel Hahn MRN: [id number] Date of Birth: 1956-05-24 Referring Provider (OT): DR Lynnae January Date: 01/17/2022   OT End of Session - 01/17/22 1530     Visit Number 1    Number of Visits 16    Date for OT Re-Evaluation 03/14/22    OT Start Time 1115    OT Stop Time 1200    OT Time Calculation (min) 45 min    Activity Tolerance Patient tolerated treatment well    Behavior During Therapy [facility] for tasks assessed/performed             Past Medical History:  Diagnosis Date   Hypertension    Kidney stones    Mitral valve prolapse     Past Surgical History:  Procedure Laterality Date   KIDNEY CYST REMOVAL     LITHOTRIPSY     TONSILLECTOMY      There were no vitals filed for this visit.   Subjective Assessment - 01/17/22 1343     Subjective  Feels for me like the same - pain in my arm and cannot wear the sling- funny feeling over the top of my forearm to my hand- that is new -    Pertinent History Right radial tunnel release. (Right)  Right open ECRB release. (Right)    Date of Surgery: December 29, 2021    Surgeon: Janalyn Rouse, MD     Subjective:     Rachel Hahn is a 66 y.o. female who presents today for wound check 01/10/22 . Patient has a surgical incision wound which is located on the right antecubital fossa. Current symptoms: no wound issues Pain is rated 8/10. 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, dog, 2 cats, play games on phone,      AROM   Right Elbow Flexion --   pain last 10 degrees   Right Elbow Extension --   pain last 10 degrees   Right Forearm Pronation 90 Degrees    Right Forearm Supination 90 Degrees   pain   Right Wrist Extension 48 Degrees   pull   Right Wrist Flexion 75 Degrees    Right Wrist Radial Deviation 15 Degrees   pain   Right Wrist  Ulnar Deviation 40 Degrees                      OT Treatments/Exercises (OP) - 01/17/22 0001       RUE Contrast Bath   Time 8 minutes    Comments Elbow prior to ROM            pt to work on gentle AROM keeping pain under 2/10 with end range for elbow flexion, ext- 3 positions - in supine  5 reps x 2-3  Supination , pronation 12 reps Wrist AAROM ext and AROM flexion - and RD, UD AAROM  12 reps  Follow precautions for surgeon -no pulling , pushing , lifting  2 x day         OT Education - 01/17/22 1530     Education Details Findings of eval and HEP    Person(s) Educated Patient    Methods Explanation;Demonstration;Tactile cues;Verbal cues;Handout    Comprehension Verbal cues required;Returned demonstration;Verbalized understanding                 OT Long Term Goals - 01/17/22 1546       OT LONG TERM GOAL #1   Title Pt to be independent in HEP to decrease pain to less than 2/10 in wrist, forearm and elbow AROM    Baseline pain 2-4/10 at the best and resting in session 7/10 - increase with AROM to 8/10  end range elbow , sup and wrist ext and RD    Time 3    Period Weeks    Status New    Target Date  02/07/22      OT LONG TERM GOAL #2   Title Pt show pain less than 2/10 with R elbow, forearm and wrist AROM in all planes to use in more than 50% of  ADL's with more ease    Baseline pain 2-8/10 - but with use or AROM 7-8/10    Time 5    Period Weeks    Status New    Target Date 02/21/22      OT LONG TERM GOAL #3   Title R UE AROM and strength increase to [facility] to use R arm in ADL's without increase symptoms    Baseline pain increase to 7-8/10 with AROM elbow , sup , wrist ext and RD -and gripping - NOW PULLING , pushing , gripping -per surgeon order  - 21/2 wks s/p    Time 8    Period Weeks    Status New    Target Date 03/14/22                   Plan - 01/17/22 1531     Clinical Impression Statement Pt refer to OT after having  R radial N and ECRB release surgery on 12/29/21 - pt is 2 12 wks s/p. Pt arrive with tight scar and scaps still on over anterior elbow- pt with no splint or sling. Pt report sensory changes over dorsal forarm and hand. Pt with increase pain and discomfort in elbow flexion, ext, supination as wel as wrist extention more than flexion. Some discomfort also at RD of wrist. Pt is R hand dominant - pain at rest coming in 7/10 - she report best the pain was since surgery is 2-4/10 but can increase to 8/10.  Pt was ed on no pulling , pusing and lifting - to follow surgeon order. Review with pt protocol on AROM , decrease pain  and scar tissue -and then increase strength  to increase functional use of R dominant hand in ADL's and IADL's    OT Occupational Profile and History Problem Focused Assessment - Including review of records relating to presenting problem    Occupational performance deficits (Please refer to evaluation for details): ADL's;IADL's;Work;Play;Leisure;Social Participation    Body Structure / Function / Physical Skills ADL;Decreased knowledge of use of DME;Strength;Pain;UE functional use;ROM;IADL;Decreased knowledge of precautions;Flexibility    Rehab  Potential Fair    Clinical Decision Making Limited treatment options, no task modification necessary    Comorbidities Affecting Occupational Performance: None    Modification or Assistance to Complete Evaluation  No modification of tasks or assist necessary to complete eval    OT Frequency 2x / week    OT Duration 8 weeks    OT Treatment/Interventions Self-care/ADL training;Ultrasound;Contrast Bath;Therapeutic exercise;Manual Therapy;Patient/family education;Passive range of motion;Therapeutic activities;Splinting;DME and/or AE instruction;Scar mobilization;Paraffin    Consulted and Agree with Plan of Care Patient             Patient will benefit from skilled therapeutic intervention in order to improve the following deficits and impairments:   Body Structure / Function / Physical Skills: ADL, Decreased knowledge of use of DME, Strength, Pain, UE functional use, ROM, IADL, Decreased knowledge of precautions, Flexibility       Visit Diagnosis: Stiffness of right elbow, not elsewhere classified - Plan: Ot plan of care cert/re-cert  Stiffness of right wrist, not elsewhere classified - Plan: Ot plan of care cert/re-cert  Scar condition and fibrosis of skin - Plan: Ot plan of care cert/re-cert  Pain in right arm - Plan: Ot plan of care cert/re-cert  Muscle weakness (generalized) - Plan: Ot plan of care cert/re-cert    Problem List Patient Active Problem List   Diagnosis Date Noted   Lateral antebrachial cutaneous nerve injury, sequela (Right) 10/06/2021   Right forearm pain 10/06/2021   Nerve entrapment syndrome of arm (Right) 10/06/2021   Chronic pain syndrome 10/05/2021   Pharmacologic therapy 10/05/2021   Disorder of skeletal system 10/05/2021   Problems influencing health status 10/05/2021   Encounter for chronic pain management 10/05/2021   Chronic upper extremity pain (1ry area of Pain) (Right) 03/05/2021   Chest pain 05/17/2017   Accelerated hypertension 05/17/2017     Oletta Cohn, OTR/L,CLT 01/17/2022, 3:54 PM  [facility] [address] [place], [address] Phone: [phone]   Fax:  [phone]  Name: Rachel Hahn MRN: [id number] Date of Birth: 1956-09-21

## 2022-01-19 ENCOUNTER — Other Ambulatory Visit: Payer: Self-pay

## 2022-01-19 ENCOUNTER — Ambulatory Visit: Payer: 59 | Attending: Student | Admitting: Occupational Therapy

## 2022-01-19 DIAGNOSIS — M25631 Stiffness of right wrist, not elsewhere classified: Secondary | ICD-10-CM | POA: Insufficient documentation

## 2022-01-19 DIAGNOSIS — M79601 Pain in right arm: Secondary | ICD-10-CM | POA: Insufficient documentation

## 2022-01-19 DIAGNOSIS — M25621 Stiffness of right elbow, not elsewhere classified: Secondary | ICD-10-CM | POA: Diagnosis not present

## 2022-01-19 DIAGNOSIS — L905 Scar conditions and fibrosis of skin: Secondary | ICD-10-CM | POA: Diagnosis present

## 2022-01-19 DIAGNOSIS — M6281 Muscle weakness (generalized): Secondary | ICD-10-CM | POA: Diagnosis present

## 2022-01-19 DIAGNOSIS — G5631 Lesion of radial nerve, right upper limb: Secondary | ICD-10-CM | POA: Insufficient documentation

## 2022-01-19 NOTE — Therapy (Signed)
[place] [facility] [address], [place], [address] Phone: [phone]   Fax:  [phone]  Occupational Therapy Treatment  Patient Details  Name: Rachel Hahn MRN: [id number] Date of Birth: January 04, 1956 Referring Provider (OT): DR Lynnae January Date: 01/19/2022   OT End of Session - 01/19/22 1703     Visit Number 2    Number of Visits 16    Date for OT Re-Evaluation 03/14/22    OT Start Time 1615    OT Stop Time 1655    OT Time Calculation (min) 40 min    Activity Tolerance Patient tolerated treatment well    Behavior During Therapy [facility] for tasks assessed/performed             Past Medical History:  Diagnosis Date   Hypertension    Kidney stones    Mitral valve prolapse     Past Surgical History:  Procedure Laterality Date   KIDNEY CYST REMOVAL     LITHOTRIPSY     TONSILLECTOMY      There were no vitals filed for this visit.   Subjective Assessment - 01/19/22 1659     Subjective  Doing little better -the swelling is better and felt good this morning when I got up -and this afternoon too-until I had to do my exercises and had to get out in the cold    Pertinent History Right radial tunnel release. (Right)  Right open ECRB release. (Right)    Date of Surgery: December 29, 2021    Surgeon: Janalyn Rouse, MD     Subjective:     Rachel Hahn is a 66 y.o. female who presents today for wound check 01/10/22 . Patient has a surgical incision wound which is located on the right antecubital fossa. Current symptoms: no wound issues Pain is rated 8/10. 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 Yes    Pain Score 4    increase to 6/10 with elbow flexion end range palm up   Pain Location Arm    Pain Orientation Right     Pain Descriptors / Indicators Tightness;Tender;Aching                [facility] OT Assessment - 01/19/22 0001       AROM   Right Forearm Supination --   less pain and about 75                     OT Treatments/Exercises (OP) - 01/19/22 0001       Moist Heat Therapy   Number Minutes Moist Heat 6 Minutes    Moist Heat Location Elbow;Wrist   prior to AROM           Soft tissue massage done over volar forearm and elbow - MLD light to decrease swelling  Pt ed on use of Cica scar pad when scabs comes over over the weekend  And to use elbow pad over it for light compression and to keep pad in place   gentle AROM keeping pain under 2/10 with end range for elbow flexion, ext- 3 positions - in standing today- done AAROM too 5 reps each  5 reps x 2-3 day Supination , pronation 12 reps- no pain after soft tissue and elbow AROM  Wrist AAROM for ext and AROM flexion - stop when feeling pull   RD, UD AAROM  12 reps  Follow precautions for surgeon -no pulling , pushing , lifting  2 x day       OT Education - 01/19/22 1703     Education Details progress and HEP    Person(s) Educated Patient    Methods Explanation;Demonstration;Tactile cues;Verbal cues;Handout    Comprehension Verbal cues required;Returned demonstration;Verbalized understanding                 OT Long Term Goals - 01/17/22 1546       OT LONG TERM GOAL #1   Title Pt to be independent in HEP to decrease pain to less than 2/10 in wrist, forearm and elbow AROM    Baseline pain 2-4/10 at the best and resting in session 7/10 - increase with AROM to 8/10  end range elbow , sup and wrist ext and RD    Time 3    Period Weeks    Status New    Target Date 02/07/22      OT LONG TERM GOAL #2   Title Pt show pain less than 2/10 with R elbow, forearm and wrist AROM in all planes to use in more than 50% of  ADL's with more ease    Baseline pain 2-8/10 - but with use or AROM 7-8/10    Time 5    Period  Weeks    Status New    Target Date 02/21/22      OT LONG TERM GOAL #3   Title R UE AROM and strength increase to [facility] to use R arm in ADL's without increase symptoms    Baseline pain increase to 7-8/10 with AROM elbow , sup , wrist ext and RD -and gripping - NOW PULLING , pushing , gripping -per surgeon order  - 21/2 wks s/p    Time 8    Period Weeks    Status New    Target Date 03/14/22                   Plan - 01/19/22 1703     Clinical Impression Statement Pt refer to OT after having  R radial N and ECRB release surgery on 12/29/21 - pt is 3 wks s/p. Pt arrive with tight scar and scaps still on over anterior elbow- pt with no splint or sling. Pt report sensory changes over dorsal forarm and hand. Pt since eval 2 days ago show decrease swelling ,and further active range in supination and elbow  prior to pain increasing. Pt cont to have increase pain and discomfort in elbow flexion, ext, supination as wel as wrist extention.  Pt is R hand dominant - pain at rest coming in 4/10 - she report pain increase to about 6/10 today during AROM exercises.  Pt was ed on no pulling , pusing and lifting - to follow surgeon order. Review with pt protocol on AROM , decrease pain and scar tissue -and then increase strength  to increase functional use of R dominant hand in ADL's and IADL's    OT Occupational Profile and History Problem Focused Assessment - Including review of records relating to presenting problem    Occupational performance deficits (Please refer to evaluation for details): ADL's;IADL's;Work;Play;Leisure;Social Participation    Body Structure / Function / Physical Skills ADL;Decreased knowledge  of use of DME;Strength;Pain;UE functional use;ROM;IADL;Decreased knowledge of precautions;Flexibility    Rehab Potential Fair    Clinical Decision Making Limited treatment options, no task modification necessary    Modification or Assistance to Complete Evaluation  No modification of tasks or  assist necessary to complete eval    OT Frequency 2x / week    OT Duration 8 weeks    OT Treatment/Interventions Self-care/ADL training;Ultrasound;Contrast Bath;Therapeutic exercise;Manual Therapy;Patient/family education;Passive range of motion;Therapeutic activities;Splinting;DME and/or AE instruction;Scar mobilization;Paraffin    Consulted and Agree with Plan of Care Patient             Patient will benefit from skilled therapeutic intervention in order to improve the following deficits and impairments:   Body Structure / Function / Physical Skills: ADL, Decreased knowledge of use of DME, Strength, Pain, UE functional use, ROM, IADL, Decreased knowledge of precautions, Flexibility       Visit Diagnosis: Stiffness of right elbow, not elsewhere classified  Stiffness of right wrist, not elsewhere classified  Scar condition and fibrosis of skin  Pain in right arm  Muscle weakness (generalized)  Radial nerve compression, right    Problem List Patient Active Problem List   Diagnosis Date Noted   Lateral antebrachial cutaneous nerve injury, sequela (Right) 10/06/2021   Right forearm pain 10/06/2021   Nerve entrapment syndrome of arm (Right) 10/06/2021   Chronic pain syndrome 10/05/2021   Pharmacologic therapy 10/05/2021   Disorder of skeletal system 10/05/2021   Problems influencing health status 10/05/2021   Encounter for chronic pain management 10/05/2021   Chronic upper extremity pain (1ry area of Pain) (Right) 03/05/2021   Chest pain 05/17/2017   Accelerated hypertension 05/17/2017    Oletta Cohn, OTR/L,CLT 01/19/2022, 5:07 PM  [place] [facility] [address], [place], [address] Phone: [phone]   Fax:  [phone]  Name: Rachel Hahn MRN: [id number] Date of Birth: January 28, 1956

## 2022-01-24 ENCOUNTER — Other Ambulatory Visit: Payer: Self-pay

## 2022-01-24 ENCOUNTER — Ambulatory Visit: Payer: 59 | Admitting: Occupational Therapy

## 2022-01-24 DIAGNOSIS — M25621 Stiffness of right elbow, not elsewhere classified: Secondary | ICD-10-CM

## 2022-01-24 DIAGNOSIS — M25631 Stiffness of right wrist, not elsewhere classified: Secondary | ICD-10-CM

## 2022-01-24 DIAGNOSIS — M6281 Muscle weakness (generalized): Secondary | ICD-10-CM

## 2022-01-24 DIAGNOSIS — M79601 Pain in right arm: Secondary | ICD-10-CM

## 2022-01-24 DIAGNOSIS — L905 Scar conditions and fibrosis of skin: Secondary | ICD-10-CM

## 2022-01-24 NOTE — Therapy (Signed)
[place] [facility] [facility]TS [facility]ICINE [address], [place], [address] Phone: [phone]   Fax:  [phone]  Occupational Therapy Treatment  Patient Details  Name: Rachel Hahn MRN: [id number] Date of Birth: 01/03/1956 Referring Provider (OT): DR Lennox Solders Date: 01/24/2022   OT End of Session - 01/24/22 1117     Visit Number 3    Number of Visits 16    Date for OT Re-Evaluation 03/14/22    OT Start Time 1115    OT Stop Time 1155    OT Time Calculation (min) 40 min    Activity Tolerance Patient tolerated treatment well    Behavior During Therapy [facility] for tasks assessed/performed             Past Medical History:  Diagnosis Date   Hypertension    Kidney stones    Mitral valve prolapse     Past Surgical History:  Procedure Laterality Date   KIDNEY CYST REMOVAL     LITHOTRIPSY     TONSILLECTOMY      There were no vitals filed for this visit.   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 (Right)  Right open ECRB release. (Right)    Date of Surgery: December 29, 2021    Surgeon: Anselmo Rod, MD     Subjective:     Rachel Hahn is a 66 y.o. female who presents today for wound check 01/10/22 . Patient has a surgical incision wound which is located on the right antecubital fossa. Current symptoms: no wound issues Pain is rated 8/10. 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 Yes    Pain Score 4     Pain Location Wrist    Pain Orientation Right    Pain Descriptors / Indicators Aching;Tender;Tightness    Pain  Onset More than a month ago    Pain Frequency Intermittent                     No pain in supination this date  But end range elbow flexion and extention with thumb up and in supination position Tender over lateral epicondyle and stretch for extensors      OT Treatments/Exercises (OP) - 01/24/22 0001       Moist Heat Therapy   Number Minutes Moist Heat 6 Minutes    Moist Heat Location Elbow;Wrist   prior to ROM           Soft tissue massage done over volar forearm and elbow - MLD light to decrease swelling  And scar massage and mobs this date - pt ed on doing at home to increase extention and flexion  Cont to use Cica scar pad night time and  use elbow pad over it for light compression and to keep pad in place    gentle AROM keeping pain under 2/10 with end range for elbow flexion, ext- 3  positions - in standing today- done AAROM too 5 reps each   5 reps x 2-3 day But do in supine with heat to decrease strain irritating lateral epicondyle Supination , pronation 12 reps- no pain soft tissue and elbow AROM  Wrist AAROM for ext and AROM flexion - stop when feeling pull   RD, UD AAROM  12 reps  Follow precautions for surgeon -no pulling , pushing , lifting  2 x day           OT Education - 01/24/22 1117     Education Details progress and HEP    Person(s) Educated Patient    Methods Explanation;Demonstration;Tactile cues;Verbal cues;Handout    Comprehension Verbal cues required;Returned demonstration;Verbalized understanding                 OT Long Term Goals - 01/17/22 1546       OT LONG TERM GOAL #1   Title Pt to be independent in HEP to decrease pain to less than 2/10 in wrist, forearm and elbow AROM    Baseline pain 2-4/10 at the best and resting in session 7/10 - increase with AROM to 8/10  end range elbow , sup and wrist ext and RD    Time 3    Period Weeks    Status New    Target Date 02/07/22      OT LONG TERM GOAL #2   Title Pt  show pain less than 2/10 with R elbow, forearm and wrist AROM in all planes to use in more than 50% of  ADL's with more ease    Baseline pain 2-8/10 - but with use or AROM 7-8/10    Time 5    Period Weeks    Status New    Target Date 02/21/22      OT LONG TERM GOAL #3   Title R UE AROM and strength increase to [facility] to use R arm in ADL's without increase symptoms    Baseline pain increase to 7-8/10 with AROM elbow , sup , wrist ext and RD -and gripping - NOW PULLING , pushing , gripping -per surgeon order  - 21/2 wks s/p    Time 8    Period Weeks    Status New    Target Date 03/14/22                   Plan - 01/24/22 1118     Clinical Impression Statement Pt refer to OT after having  R radial N and ECRB release surgery on 12/29/21 - pt is 3 1/2  wks s/p. Making great progress the last 2 wks in AROM for elbow/forearm supination  with less pain , scar now without scabs and can start doing more scar massage - is using since last visit cica scar pad.Cont to  report sensory changes over dorsal forarm and hand. Decrease swelling but cont to have increase tightness for elbow flexion , ext end range in supination.  Pt is R hand dominant - pain at rest coming in 1/10 - she report pain increase  with use over the weekend to 10/10 with cleaning but did decrease. Pain and tenderness over Lateral epicondyle .  Pt was ed on no pulling , pusing and lifting - to follow surgeon order. Review with pt protocol on AROM , decrease pain and scar tissue -and then increase strength  to increase functional use of R dominant hand in ADL's and IADL's    OT Occupational Profile  and History Problem Focused Assessment - Including review of records relating to presenting problem    Occupational performance deficits (Please refer to evaluation for details): ADL's;IADL's;Work;Play;Leisure;Social Participation    Body Structure / Function / Physical Skills ADL;Decreased knowledge of use of DME;Strength;Pain;UE functional  use;ROM;IADL;Decreased knowledge of precautions;Flexibility    Rehab Potential Fair    Clinical Decision Making Limited treatment options, no task modification necessary    Comorbidities Affecting Occupational Performance: None    Modification or Assistance to Complete Evaluation  No modification of tasks or assist necessary to complete eval    OT Frequency 2x / week    OT Duration 8 weeks    OT Treatment/Interventions Self-care/ADL training;Ultrasound;Contrast Bath;Therapeutic exercise;Manual Therapy;Patient/family education;Passive range of motion;Therapeutic activities;Splinting;DME and/or AE instruction;Scar mobilization;Paraffin    Consulted and Agree with Plan of Care Patient             Patient will benefit from skilled therapeutic intervention in order to improve the following deficits and impairments:   Body Structure / Function / Physical Skills: ADL, Decreased knowledge of use of DME, Strength, Pain, UE functional use, ROM, IADL, Decreased knowledge of precautions, Flexibility       Visit Diagnosis: Stiffness of right elbow, not elsewhere classified  Stiffness of right wrist, not elsewhere classified  Scar condition and fibrosis of skin  Pain in right arm  Muscle weakness (generalized)    Problem List Patient Active Problem List   Diagnosis Date Noted   Lateral antebrachial cutaneous nerve injury, sequela (Right) 10/06/2021   Right forearm pain 10/06/2021   Nerve entrapment syndrome of arm (Right) 10/06/2021   Chronic pain syndrome 10/05/2021   Pharmacologic therapy 10/05/2021   Disorder of skeletal system 10/05/2021   Problems influencing health status 10/05/2021   Encounter for chronic pain management 10/05/2021   Chronic upper extremity pain (1ry area of Pain) (Right) 03/05/2021   Chest pain 05/17/2017   Accelerated hypertension 05/17/2017    Rosalyn Gess, OTR/L,CLT 01/24/2022, 12:06 PM  [place] [facility]  SPORTS MEDICINE [address] [place], [address] Phone: [phone]   Fax:  [phone]  Name: Keshonda Leapley MRN: [id number] Date of Birth: 17-Apr-1956

## 2022-01-27 ENCOUNTER — Other Ambulatory Visit: Payer: Self-pay

## 2022-01-27 ENCOUNTER — Ambulatory Visit: Payer: 59 | Admitting: Occupational Therapy

## 2022-01-27 DIAGNOSIS — M25631 Stiffness of right wrist, not elsewhere classified: Secondary | ICD-10-CM

## 2022-01-27 DIAGNOSIS — L905 Scar conditions and fibrosis of skin: Secondary | ICD-10-CM

## 2022-01-27 DIAGNOSIS — M79601 Pain in right arm: Secondary | ICD-10-CM

## 2022-01-27 DIAGNOSIS — M25621 Stiffness of right elbow, not elsewhere classified: Secondary | ICD-10-CM | POA: Diagnosis not present

## 2022-01-27 DIAGNOSIS — M6281 Muscle weakness (generalized): Secondary | ICD-10-CM

## 2022-01-27 NOTE — Therapy (Signed)
[place] [facility] [address], [place], [address] Phone: [phone]   Fax:  [phone]  Occupational Therapy Treatment  Patient Details  Name: Rachel Hahn MRN: [id number] Date of Birth: 1956-03-31 Referring Provider (OT): DR Rachel Hahn Date: 01/27/2022   OT End of Session - 01/27/22 1116     Visit Number 4    Number of Visits 16    Date for OT Re-Evaluation 03/14/22    OT Start Time 1115    OT Stop Time 1200    OT Time Calculation (min) 45 min    Activity Tolerance Patient tolerated treatment well    Behavior During Therapy [facility] for tasks assessed/performed             Past Medical History:  Diagnosis Date   Hypertension    Kidney stones    Mitral valve prolapse     Past Surgical History:  Procedure Laterality Date   KIDNEY CYST REMOVAL     LITHOTRIPSY     TONSILLECTOMY      There were no vitals filed for this visit.   Subjective Assessment - 01/27/22 1116     Subjective  Doing much better it is mostly now outside of my elbow - I can rotate my forearm and bend/straigthen elbow    Pertinent History Right radial tunnel release. (Right)  Right open ECRB release. (Right)    Date of Surgery: Hahn 11, 2023    Surgeon: Rachel Rouse, MD     Subjective:     Rachel Hahn is a 66 y.o. female who presents today for wound check 01/10/22 . Patient has a surgical incision wound which is located on the right antecubital fossa. Current symptoms: no wound issues Pain is rated 8/10. 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 Yes    Pain Score 2     Pain Location Elbow    Pain Orientation Right    Pain Descriptors / Indicators Aching;Tender;Tightness    Pain Type Surgical pain;Chronic pain    Pain Onset More than  a month ago    Pain Frequency Intermittent             Increase supination , elbow flexion and extention with less pain  Tenderness over lateral epicondyle and pain with wrist ext and flexion stretches over lateral epicondyle              OT Treatments/Exercises (OP) - 01/27/22 0001       Moist Heat Therapy   Number Minutes Moist Heat 6 Minutes    Moist Heat Location Elbow;Wrist      Ultrasound   Ultrasound Location L lat epicondyle    Ultrasound Parameters 3. , 20%, 4 min    Ultrasound Goals Pain            Soft tissue massage done over volar forearm and elbow - MLD light to decrease swelling  And scar massage and mobs this date - pt ed on doing at home to increase extention and flexion  Cont to use Cica scar pad night time and  use elbow pad over it for light compression and to keep pad in place   Ed  pt on massage over lateral epicondyle  And then AAROM by OT with extended elbow forearm extensor and flexor stretches - neutral and palm down 5 reps hold 5 sec pain less than 2/10     gentle AROM keeping pain under 2/10 with end range for elbow flexion, ext- 3 positions - in standing today- done AAROM too 5 reps each   5 reps x 2-3 day But do in supine with heat to decrease strain irritating lateral epicondyle Supination , pronation 12 reps- no pain soft tissue and elbow AROM   12 reps  Follow precautions for surgeon -no pulling , pushing , lifting  2 x day         OT Education - 01/27/22 1116     Education Details progress and HEP    Person(s) Educated Patient    Methods Explanation;Demonstration;Tactile cues;Verbal cues;Handout    Comprehension Verbal cues required;Returned demonstration;Verbalized understanding                 OT Long Term Goals - 01/17/22 1546       OT LONG TERM GOAL #1   Title Pt to be independent in HEP to decrease pain to less than 2/10 in wrist, forearm and elbow AROM    Baseline pain 2-4/10 at the best and  resting in session 7/10 - increase with AROM to 8/10  end range elbow , sup and wrist ext and RD    Time 3    Period Weeks    Status New    Target Date 02/07/22      OT LONG TERM GOAL #2   Title Pt show pain less than 2/10 with R elbow, forearm and wrist AROM in all planes to use in more than 50% of  ADL's with more ease    Baseline pain 2-8/10 - but with use or AROM 7-8/10    Time 5    Period Weeks    Status New    Target Date 02/21/22      OT LONG TERM GOAL #3   Title R UE AROM and strength increase to [employer] to use R arm in ADL's without increase symptoms    Baseline pain increase to 7-8/10 with AROM elbow , sup , wrist ext and RD -and gripping - NOW PULLING , pushing , gripping -per surgeon order  - 21/2 wks s/p    Time 8    Period Weeks    Status New    Target Date 03/14/22                   Plan - 01/27/22 1116     Clinical Impression Statement Pt refer to OT after having  R radial N and ECRB release surgery on 12/29/21 - pt is 3 1/2  wks s/p. Making great progress the last 2 wks in AROM for elbow flexion , ext and forearm supination  with less pain , scar  improved and pt could start doing scar mobs this week - increase flexibility - using cica scar pad.Cont to  report sensory changes over dorsal forarm and hand( moe of numbness). Decrease swelling .  Pt is R hand dominant - pain at rest coming in 0/10 increase in session 2/10 for surgery  - she do report pain increase and tenderness over Lateral epicondyle , she had some symptoms in past.  Pt was ed on no pulling , pusing and lifting - to follow surgeon order. Review with pt protocol on AROM , decrease  pain and scar tissue -and then increase strength  to increase functional use of R dominant hand in ADL's and IADL's    OT Occupational Profile and History Problem Focused Assessment - Including review of records relating to presenting problem    Occupational performance deficits (Please refer to evaluation for details):  ADL's;IADL's;Work;Play;Leisure;Social Participation    Body Structure / Function / Physical Skills ADL;Decreased knowledge of use of DME;Strength;Pain;UE functional use;ROM;IADL;Decreased knowledge of precautions;Flexibility    Rehab Potential Fair    Clinical Decision Making Limited treatment options, no task modification necessary    Comorbidities Affecting Occupational Performance: None    Modification or Assistance to Complete Evaluation  No modification of tasks or assist necessary to complete eval    OT Frequency 2x / week    OT Duration 8 weeks    OT Treatment/Interventions Self-care/ADL training;Ultrasound;Contrast Bath;Therapeutic exercise;Manual Therapy;Patient/family education;Passive range of motion;Therapeutic activities;Splinting;DME and/or AE instruction;Scar mobilization;Paraffin    Consulted and Agree with Plan of Care Patient             Patient will benefit from skilled therapeutic intervention in order to improve the following deficits and impairments:   Body Structure / Function / Physical Skills: ADL, Decreased knowledge of use of DME, Strength, Pain, UE functional use, ROM, IADL, Decreased knowledge of precautions, Flexibility       Visit Diagnosis: Stiffness of right elbow, not elsewhere classified  Stiffness of right wrist, not elsewhere classified  Pain in right arm  Scar condition and fibrosis of skin  Muscle weakness (generalized)    Problem List Patient Active Problem List   Diagnosis Date Noted   Lateral antebrachial cutaneous nerve injury, sequela (Right) 10/06/2021   Right forearm pain 10/06/2021   Nerve entrapment syndrome of arm (Right) 10/06/2021   Chronic pain syndrome 10/05/2021   Pharmacologic therapy 10/05/2021   Disorder of skeletal system 10/05/2021   Problems influencing health status 10/05/2021   Encounter for chronic pain management 10/05/2021   Chronic upper extremity pain (1ry area of Pain) (Right) 03/05/2021   Chest pain  05/17/2017   Accelerated hypertension 05/17/2017    Oletta Cohn, OTR/L,CLT 01/27/2022, 12:26 PM  [place] [facility] [address], [place], [address] Phone: [phone]   Fax:  [phone]  Name: Rachel Hahn MRN: [id number] Date of Birth: [birth year]

## 2022-01-31 ENCOUNTER — Other Ambulatory Visit: Payer: Self-pay

## 2022-01-31 ENCOUNTER — Ambulatory Visit: Payer: 59 | Admitting: Occupational Therapy

## 2022-01-31 DIAGNOSIS — M25621 Stiffness of right elbow, not elsewhere classified: Secondary | ICD-10-CM

## 2022-01-31 DIAGNOSIS — M25631 Stiffness of right wrist, not elsewhere classified: Secondary | ICD-10-CM

## 2022-01-31 DIAGNOSIS — M6281 Muscle weakness (generalized): Secondary | ICD-10-CM

## 2022-01-31 DIAGNOSIS — M79601 Pain in right arm: Secondary | ICD-10-CM

## 2022-01-31 DIAGNOSIS — L905 Scar conditions and fibrosis of skin: Secondary | ICD-10-CM

## 2022-01-31 DIAGNOSIS — G5631 Lesion of radial nerve, right upper limb: Secondary | ICD-10-CM

## 2022-01-31 NOTE — Therapy (Signed)
[facility] [address] [place], [address] Phone: [phone]   Fax:  [phone]  Occupational Therapy Treatment  Patient Details  Name: Rachel Hahn MRN: [id number] Date of Birth: [birth year] Referring Provider (OT): DR Lynnae January Date: 01/31/2022   OT End of Session - 01/31/22 1129     Visit Number 5    Number of Visits 16    Date for OT Re-Evaluation 03/14/22    OT Start Time 1118    OT Stop Time 1207    OT Time Calculation (min) 49 min    Activity Tolerance Patient tolerated treatment well    Behavior During Therapy [facility] for tasks assessed/performed             Past Medical History:  Diagnosis Date   Hypertension    Kidney stones    Mitral valve prolapse     Past Surgical History:  Procedure Laterality Date   KIDNEY CYST REMOVAL     LITHOTRIPSY     TONSILLECTOMY      There were no vitals filed for this visit.   Subjective Assessment - 01/31/22 1128     Subjective  DOing better where I had the surgery - tight but not pain - I realized over the weekend I did something and it did not bother me - but that outside of my elbow still is hurting    Pertinent History Right radial tunnel release. (Right)  Right open ECRB release. (Right)    Date of Surgery: December 29, 2021    Surgeon: Janalyn Rouse, MD     Subjective:     Rachel Hahn is a 66 y.o. female who presents today for wound check 01/10/22 . Patient has a surgical incision wound which is located on the right antecubital fossa. Current symptoms: no wound issues Pain is rated 8/10. 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 Yes    Pain Score 7     Pain Location --   lateral epicondyle   Pain Orientation Right    Pain Descriptors / Indicators  Tightness;Tender    Pain Type Acute pain;Surgical pain    Pain Onset More than a month ago    Pain Frequency Intermittent                  AROM for supination and elbow WNL this date - no pain - able to touch back of head and lower back no pain Radial N glide -after heat WNL - no pain    Tenderness over lateral epicondyle  7/10 per pt                                   SKin check done prior and afterward for ionto - tolerate well - no issues    OT Treatments/Exercises (OP) - 01/31/22 0001       Moist Heat Therapy   Number Minutes Moist Heat 6 Minutes    Moist Heat Location Elbow;Wrist      Iontophoresis   Type of Iontophoresis Dexamethasone    Location R lateral epicondyle  Dose med patch , 2.0 current    Time 19 min            pain /stretch with forearm extensor stretches - 5 reps hold 5 sec after heat done - less pull that prior to heat    Soft tissue massage done over volar forearm and elbow - MLD light to decrease swelling - progress greatly  And scar massage and mobs this date - pt ed on doing at home to increase extention and flexion  Cont to use Cica scar pad night time and  use elbow pad over it for light compression and to keep pad in place   Ed pt on massage over lateral epicondyle      AROM  end range for elbow flexion, ext- 3 positions - in standing today- done AAROM too 5 reps each   5 reps x 2-3 day NO pain after heat  Supination , pronation 12 reps- no pain soft tissue and elbow AROM   12 reps  Follow precautions for surgeon -no pulling , pushing , lifting  2 x day        OT Education - 01/31/22 1129     Education Details progress and HEP    Person(s) Educated Patient    Methods Explanation;Demonstration;Tactile cues;Verbal cues;Handout    Comprehension Verbal cues required;Returned demonstration;Verbalized understanding                 OT Long Term Goals - 01/17/22 1546       OT LONG TERM GOAL #1    Title Pt to be independent in HEP to decrease pain to less than 2/10 in wrist, forearm and elbow AROM    Baseline pain 2-4/10 at the best and resting in session 7/10 - increase with AROM to 8/10  end range elbow , sup and wrist ext and RD    Time 3    Period Weeks    Status New    Target Date 02/07/22      OT LONG TERM GOAL #2   Title Pt show pain less than 2/10 with R elbow, forearm and wrist AROM in all planes to use in more than 50% of  ADL's with more ease    Baseline pain 2-8/10 - but with use or AROM 7-8/10    Time 5    Period Weeks    Status New    Target Date 02/21/22      OT LONG TERM GOAL #3   Title R UE AROM and strength increase to [facility] to use R arm in ADL's without increase symptoms    Baseline pain increase to 7-8/10 with AROM elbow , sup , wrist ext and RD -and gripping - NOW PULLING , pushing , gripping -per surgeon order  - 21/2 wks s/p    Time 8    Period Weeks    Status New    Target Date 03/14/22                   Plan - 01/31/22 1129     Clinical Impression Statement Pt refer to OT after having  R radial N and ECRB release surgery on 12/29/21 - pt is 3 1/2  wks s/p. Making great progress in AROM for elbow flexion , ext and forearm supination  with less pain , scar  improved and only feeling light pull with end range , able to do radial N glide without pain - Increase flexibility - using cica scar pad  night time.Cont to  report sensory changes over dorsal forarm and hand. Decrease swelling .  Pt is R hand dominant - pain at rest coming in 0/1. Her pain and tenderness is now more over Lateral epicondyle , she had some symptoms in past. Initiate this date Iontphoresis with dexamethazone to lateral epicondyle - pt tolerate well - skin check done afterwards - no issues.   Pt was ed on no pulling , pusing and lifting - to follow surgeon order. Review with pt protocol on AROM , decrease pain and scar tissue -and then increase strength  to increase functional use of R  dominant hand in ADL's and IADL's    OT Occupational Profile and History Problem Focused Assessment - Including review of records relating to presenting problem    Occupational performance deficits (Please refer to evaluation for details): ADL's;IADL's;Work;Play;Leisure;Social Participation    Body Structure / Function / Physical Skills ADL;Decreased knowledge of use of DME;Strength;Pain;UE functional use;ROM;IADL;Decreased knowledge of precautions;Flexibility    Rehab Potential Fair    Clinical Decision Making Limited treatment options, no task modification necessary    Comorbidities Affecting Occupational Performance: None    Modification or Assistance to Complete Evaluation  No modification of tasks or assist necessary to complete eval    OT Frequency 2x / week    OT Duration 8 weeks    OT Treatment/Interventions Self-care/ADL training;Ultrasound;Contrast Bath;Therapeutic exercise;Manual Therapy;Patient/family education;Passive range of motion;Therapeutic activities;Splinting;DME and/or AE instruction;Scar mobilization;Paraffin;Iontophoresis    Consulted and Agree with Plan of Care Patient             Patient will benefit from skilled therapeutic intervention in order to improve the following deficits and impairments:   Body Structure / Function / Physical Skills: ADL, Decreased knowledge of use of DME, Strength, Pain, UE functional use, ROM, IADL, Decreased knowledge of precautions, Flexibility       Visit Diagnosis: Stiffness of right wrist, not elsewhere classified  Stiffness of right elbow, not elsewhere classified  Pain in right arm  Scar condition and fibrosis of skin  Muscle weakness (generalized)  Radial nerve compression, right    Problem List Patient Active Problem List   Diagnosis Date Noted   Lateral antebrachial cutaneous nerve injury, sequela (Right) 10/06/2021   Right forearm pain 10/06/2021   Nerve entrapment syndrome of arm (Right) 10/06/2021    Chronic pain syndrome 10/05/2021   Pharmacologic therapy 10/05/2021   Disorder of skeletal system 10/05/2021   Problems influencing health status 10/05/2021   Encounter for chronic pain management 10/05/2021   Chronic upper extremity pain (1ry area of Pain) (Right) 03/05/2021   Chest pain 05/17/2017   Accelerated hypertension 05/17/2017    Oletta Cohn, OT 01/31/2022, 11:57 AM  [facility] [address], [place], [address] Phone: [phone]   Fax:  [phone]  Name: Rachel Hahn MRN: [id number] Date of Birth: 1956-03-14

## 2022-02-03 ENCOUNTER — Ambulatory Visit: Payer: 59 | Admitting: Occupational Therapy

## 2022-02-07 ENCOUNTER — Ambulatory Visit: Payer: 59 | Admitting: Occupational Therapy

## 2022-02-07 ENCOUNTER — Other Ambulatory Visit: Payer: Self-pay

## 2022-02-07 DIAGNOSIS — L905 Scar conditions and fibrosis of skin: Secondary | ICD-10-CM

## 2022-02-07 DIAGNOSIS — M25631 Stiffness of right wrist, not elsewhere classified: Secondary | ICD-10-CM

## 2022-02-07 DIAGNOSIS — M25621 Stiffness of right elbow, not elsewhere classified: Secondary | ICD-10-CM | POA: Diagnosis not present

## 2022-02-07 DIAGNOSIS — M79601 Pain in right arm: Secondary | ICD-10-CM

## 2022-02-07 DIAGNOSIS — M6281 Muscle weakness (generalized): Secondary | ICD-10-CM

## 2022-02-07 DIAGNOSIS — G5631 Lesion of radial nerve, right upper limb: Secondary | ICD-10-CM

## 2022-02-07 NOTE — Therapy (Signed)
[facility] [address] [place], [address] Phone: [phone]   Fax:  [phone]  Occupational Therapy Treatment  Patient Details  Name: Rachel Hahn MRN: [id number] Date of Birth: Apr 12, 1956 Referring Provider (OT): DR Lynnae January Date: 02/07/2022   OT End of Session - 02/07/22 1116     Visit Number 6    Number of Visits 16    Date for OT Re-Evaluation 03/14/22    OT Start Time 1116    OT Stop Time 1206    OT Time Calculation (min) 50 min    Activity Tolerance Patient tolerated treatment well    Behavior During Therapy [facility] for tasks assessed/performed             Past Medical History:  Diagnosis Date   Hypertension    Kidney stones    Mitral valve prolapse     Past Surgical History:  Procedure Laterality Date   KIDNEY CYST REMOVAL     LITHOTRIPSY     TONSILLECTOMY      There were no vitals filed for this visit.   Subjective Assessment - 02/07/22 1116     Subjective  Increase pain since 2 days ago - I had to cancel last week - my little cat died last week - I had him for 10 years - pain in the elbow and the front - I was doing great when I seen you last time    Pertinent History Right radial tunnel release. (Right)  Right open ECRB release. (Right)    Date of Surgery: December 29, 2021    Surgeon: Janalyn Rouse, MD     Subjective:     Rachel Hahn is a 66 y.o. female who presents today for wound check 01/10/22 . Patient has a surgical incision wound which is located on the right antecubital fossa. Current symptoms: no wound issues Pain is rated 8/10. 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 Yes    Pain Score 9     Pain Location Elbow    Pain Orientation Right    Pain Descriptors / Indicators  Aching;Shooting;Sore    Pain Type Acute pain;Surgical pain    Pain Onset More than a month ago    Pain Frequency Constant               increase pain coming in compare to last week - cancelled last week - cat past away  And did some cleaning  Was doing great until about 2 days ago -increase pain lat epicondyle and anterior elbow /forearm            OT Treatments/Exercises (OP) - 02/07/22 0001       Iontophoresis   Type of Iontophoresis Dexamethasone    Location R lateral epicondyle    Dose med patch , 2.0 current    Time 19      RUE Contrast Bath   Time 8 minutes    Comments decrease pain , prior to soft tissue and ROM                Soft tissue massage done over volar forearm and elbow - MLD light to  decrease swelling - progress greatly  And scar massage and mobs this date - pt ed on doing at home to increase extention and flexion for elbow Reinforce again wearing  Cica scar pad night time and  use elbow pad over it for light compression and to keep pad in place   Ed pt on massage over lateral epicondyle      AROM  end range for elbow flexion, ext- 3 positions - supine to not fight for end range extention and cause increase lat epicondyle pain - done AAROM too 5 reps each   5 reps x 2-3 day Less pain after heat over radial N -but pain still over lat epicondyle  Supination , pronation 12 reps- no pain soft tissue and elbow AROM   12 reps  Follow precautions for surgeon -no pulling , pushing , lifting  2 x day         OT Education - 02/07/22 1116     Education Details progress and HEP    Person(s) Educated Patient    Methods Explanation;Demonstration;Tactile cues;Verbal cues;Handout    Comprehension Verbal cues required;Returned demonstration;Verbalized understanding                 OT Long Term Goals - 01/17/22 1546       OT LONG TERM GOAL #1   Title Pt to be independent in HEP to decrease pain to less than 2/10 in wrist, forearm  and elbow AROM    Baseline pain 2-4/10 at the best and resting in session 7/10 - increase with AROM to 8/10  end range elbow , sup and wrist ext and RD    Time 3    Period Weeks    Status New    Target Date 02/07/22      OT LONG TERM GOAL #2   Title Pt show pain less than 2/10 with R elbow, forearm and wrist AROM in all planes to use in more than 50% of  ADL's with more ease    Baseline pain 2-8/10 - but with use or AROM 7-8/10    Time 5    Period Weeks    Status New    Target Date 02/21/22      OT LONG TERM GOAL #3   Title R UE AROM and strength increase to [facility] to use R arm in ADL's without increase symptoms    Baseline pain increase to 7-8/10 with AROM elbow , sup , wrist ext and RD -and gripping - NOW PULLING , pushing , gripping -per surgeon order  - 21/2 wks s/p    Time 8    Period Weeks    Status New    Target Date 03/14/22                   Plan - 02/07/22 1116     Clinical Impression Statement Pt refer to OT after having  R radial N and ECRB release surgery on 12/29/21 - pt is   5 1/2 wks s/p. She mad great progress until beginning of last week in AROM for elbow flexion , ext and forearm supination  WNL and only feeling light pull with end range , was able to do radial N glide without pain - had increase flexibility. This date pt return  to clinic after not seen for week -she report her cat past away and she cleaned some because sister was getting into town. This date came in with reports of 9/10 pain - with  pain more on lateral epicondyle but also anterior elbow and forearm. Responded in session great on radial N decompression pain - with mostly tightness over scar end of session. Pt to wear cica scar pad night time again and compression. Pt is R hand dominant  Her pain and tenderness  9/10 over Lateral epicondyle , she had some symptoms in past. Initiate  last week Iontphoresis with dexamethazone to lateral epicondyle - pt tolerate well - but was only seen one time and  cancelled - repeat again today -skin check done afterwards - no issues.   Pt was ed on no pulling , pusing and lifting - to follow surgeon order. Review with pt protocol on AROM , decrease pain and scar tissue -and then increase strength  to increase functional use of R dominant hand in ADL's and IADL's    OT Occupational Profile and History Problem Focused Assessment - Including review of records relating to presenting problem    Occupational performance deficits (Please refer to evaluation for details): ADL's;IADL's;Work;Play;Leisure;Social Participation    Body Structure / Function / Physical Skills ADL;Decreased knowledge of use of DME;Strength;Pain;UE functional use;ROM;IADL;Decreased knowledge of precautions;Flexibility    Rehab Potential Fair    Clinical Decision Making Limited treatment options, no task modification necessary    Comorbidities Affecting Occupational Performance: None    Modification or Assistance to Complete Evaluation  No modification of tasks or assist necessary to complete eval    OT Frequency 2x / week    OT Duration 8 weeks    OT Treatment/Interventions Self-care/ADL training;Ultrasound;Contrast Bath;Therapeutic exercise;Manual Therapy;Patient/family education;Passive range of motion;Therapeutic activities;Splinting;DME and/or AE instruction;Scar mobilization;Paraffin;Iontophoresis    Consulted and Agree with Plan of Care Patient             Patient will benefit from skilled therapeutic intervention in order to improve the following deficits and impairments:   Body Structure / Function / Physical Skills: ADL, Decreased knowledge of use of DME, Strength, Pain, UE functional use, ROM, IADL, Decreased knowledge of precautions, Flexibility       Visit Diagnosis: Stiffness of right wrist, not elsewhere classified  Stiffness of right elbow, not elsewhere classified  Pain in right arm  Scar condition and fibrosis of skin  Muscle weakness (generalized)  Radial  nerve compression, right    Problem List Patient Active Problem List   Diagnosis Date Noted   Lateral antebrachial cutaneous nerve injury, sequela (Right) 10/06/2021   Right forearm pain 10/06/2021   Nerve entrapment syndrome of arm (Right) 10/06/2021   Chronic pain syndrome 10/05/2021   Pharmacologic therapy 10/05/2021   Disorder of skeletal system 10/05/2021   Problems influencing health status 10/05/2021   Encounter for chronic pain management 10/05/2021   Chronic upper extremity pain (1ry area of Pain) (Right) 03/05/2021   Chest pain 05/17/2017   Accelerated hypertension 05/17/2017    Oletta Cohn, OTR/L,CLT 02/07/2022, 12:22 PM  [facility] [address] [place], [address] Phone: [phone]   Fax:  [phone]  Name: Rosielee Corporan MRN: [id number] Date of Birth: 12-Dec-1956

## 2022-02-10 ENCOUNTER — Ambulatory Visit: Payer: 59 | Admitting: Occupational Therapy

## 2022-02-10 ENCOUNTER — Other Ambulatory Visit: Payer: Self-pay

## 2022-02-10 DIAGNOSIS — G5631 Lesion of radial nerve, right upper limb: Secondary | ICD-10-CM

## 2022-02-10 DIAGNOSIS — M6281 Muscle weakness (generalized): Secondary | ICD-10-CM

## 2022-02-10 DIAGNOSIS — L905 Scar conditions and fibrosis of skin: Secondary | ICD-10-CM

## 2022-02-10 DIAGNOSIS — M25621 Stiffness of right elbow, not elsewhere classified: Secondary | ICD-10-CM

## 2022-02-10 DIAGNOSIS — M25631 Stiffness of right wrist, not elsewhere classified: Secondary | ICD-10-CM

## 2022-02-10 DIAGNOSIS — M79601 Pain in right arm: Secondary | ICD-10-CM

## 2022-02-10 NOTE — Therapy (Signed)
[place] [facility] [address] [place], [address] Phone: [phone]   Fax:  [phone]  Occupational Therapy Treatment  Patient Details  Name: Rachel Hahn MRN: [id number] Date of Birth: 1956/01/18 Referring Provider (OT): DR Lynnae January Date: 02/10/2022   OT End of Session - 02/10/22 1148     Visit Number 7    Number of Visits 16    Date for OT Re-Evaluation 03/14/22    OT Start Time 1116    OT Stop Time 1206    OT Time Calculation (min) 50 min    Activity Tolerance Patient tolerated treatment well    Behavior During Therapy [facility] for tasks assessed/performed             Past Medical History:  Diagnosis Date   Hypertension    Kidney stones    Mitral valve prolapse     Past Surgical History:  Procedure Laterality Date   KIDNEY CYST REMOVAL     LITHOTRIPSY     TONSILLECTOMY      There were no vitals filed for this visit.   Subjective Assessment - 02/10/22 1146     Subjective  Better the surgery part but the outside of my elbow really hurts - when I am trying to pull or open or pinch something    Pertinent History Right radial tunnel release. (Right)  Right open ECRB release. (Right)    Date of Surgery: December 29, 2021    Surgeon: Janalyn Rouse, MD     Subjective:     Rachel Hahn is a 66 y.o. female who presents today for wound check 01/10/22 . Patient has a surgical incision wound which is located on the right antecubital fossa. Current symptoms: no wound issues Pain is rated 8/10. 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 Yes    Pain Score 6     Pain Location Elbow    Pain Orientation Right    Pain Descriptors / Indicators Aching;Tender    Pain Type Acute pain    Pain Onset More than a month ago     Pain Frequency Intermittent              No pain with elbow flexion, ext and supination Internal and external rotation WNL -pain over lateral epicondyle          Skin check done prior and pt to keep on for hour today afterwards    OT Treatments/Exercises (OP) - 02/10/22 0001       Iontophoresis   Type of Iontophoresis Dexamethasone    Location R lateral epicondyle    Dose med patch , 2.0 current    Time 19      RUE Contrast Bath   Time 8 minutes    Comments decrease pain - prior to stretches            Soft tissue massage done over volar forearm and elbow - MLD light to decrease swelling - progress greatly  And scar massage and mobs this date - pt ed on doing at home to increase extention and flexion for elbow Reinforce again wearing  Cica scar pad night time and  use elbow pad over it for light compression and to keep pad in place   Ed pt on massage over lateral epicondyle      AROM  end range for elbow flexion, ext- 3 positions - supine to not fight for end range extention and cause increase lat epicondyle pain - done AAROM too 5 reps each   5 reps x 2-3 day Less pain after heat over radial N -but pain still over lat epicondyle  Supination , pronation 12 reps- no pain soft tissue and elbow AROM   12 reps  Follow precautions for surgeon -no pulling , pushing , lifting  2 x day             OT Education - 02/10/22 1148     Education Details progress and HEP    Person(s) Educated Patient    Methods Explanation;Demonstration;Tactile cues;Verbal cues;Handout    Comprehension Verbal cues required;Returned demonstration;Verbalized understanding                 OT Long Term Goals - 01/17/22 1546       OT LONG TERM GOAL #1   Title Pt to be independent in HEP to decrease pain to less than 2/10 in wrist, forearm and elbow AROM    Baseline pain 2-4/10 at the best and resting in session 7/10 - increase with AROM to 8/10  end range elbow , sup  and wrist ext and RD    Time 3    Period Weeks    Status New    Target Date 02/07/22      OT LONG TERM GOAL #2   Title Pt show pain less than 2/10 with R elbow, forearm and wrist AROM in all planes to use in more than 50% of  ADL's with more ease    Baseline pain 2-8/10 - but with use or AROM 7-8/10    Time 5    Period Weeks    Status New    Target Date 02/21/22      OT LONG TERM GOAL #3   Title R UE AROM and strength increase to [facility] to use R arm in ADL's without increase symptoms    Baseline pain increase to 7-8/10 with AROM elbow , sup , wrist ext and RD -and gripping - NOW PULLING , pushing , gripping -per surgeon order  - 21/2 wks s/p    Time 8    Period Weeks    Status New    Target Date 03/14/22                   Plan - 02/10/22 1148     Clinical Impression Statement Pt refer to OT after having  R radial N and ECRB release surgery on 12/29/21 - pt is 6 wks s/p. She made great progress in AROM for elbow flexion , ext and forearm supination  WNL. and able to do radial N glide without pain - had increase flexibility. Most of her pain now at lateral epicondyle 610 pain - tenderness and tight with slight pull over extensors of forearm - had some symptoms prior to surgery. Pt this date 2nd session of ionto with dexamethazone -tolerate well and pt to see surgeon next Monday. Pt to wear cica scar pad night time and compression. Pt is R hand dominant   Pt was ed on no pulling , pusing and lifting - to follow surgeon order. Review with pt protocol on AROM , decrease pain and scar tissue -  and then increase strength  to increase functional use of R dominant hand in ADL's and IADL's    OT Occupational Profile and History Problem Focused Assessment - Including review of records relating to presenting problem    Occupational performance deficits (Please refer to evaluation for details): ADL's;IADL's;Work;Play;Leisure;Social Participation    Body Structure / Function / Physical Skills  ADL;Decreased knowledge of use of DME;Strength;Pain;UE functional use;ROM;IADL;Decreased knowledge of precautions;Flexibility    Rehab Potential Fair    Clinical Decision Making Limited treatment options, no task modification necessary    Comorbidities Affecting Occupational Performance: None    Modification or Assistance to Complete Evaluation  No modification of tasks or assist necessary to complete eval    OT Frequency 2x / week    OT Duration 6 weeks    OT Treatment/Interventions Self-care/ADL training;Ultrasound;Contrast Bath;Therapeutic exercise;Manual Therapy;Patient/family education;Passive range of motion;Therapeutic activities;Splinting;DME and/or AE instruction;Scar mobilization;Paraffin;Iontophoresis    Consulted and Agree with Plan of Care Patient             Patient will benefit from skilled therapeutic intervention in order to improve the following deficits and impairments:   Body Structure / Function / Physical Skills: ADL, Decreased knowledge of use of DME, Strength, Pain, UE functional use, ROM, IADL, Decreased knowledge of precautions, Flexibility       Visit Diagnosis: Stiffness of right wrist, not elsewhere classified  Stiffness of right elbow, not elsewhere classified  Pain in right arm  Scar condition and fibrosis of skin  Muscle weakness (generalized)  Radial nerve compression, right    Problem List Patient Active Problem List   Diagnosis Date Noted   Lateral antebrachial cutaneous nerve injury, sequela (Right) 10/06/2021   Right forearm pain 10/06/2021   Nerve entrapment syndrome of arm (Right) 10/06/2021   Chronic pain syndrome 10/05/2021   Pharmacologic therapy 10/05/2021   Disorder of skeletal system 10/05/2021   Problems influencing health status 10/05/2021   Encounter for chronic pain management 10/05/2021   Chronic upper extremity pain (1ry area of Pain) (Right) 03/05/2021   Chest pain 05/17/2017   Accelerated hypertension 05/17/2017     Oletta Cohn, OTR/L,CLT 02/10/2022, 11:56 AM   [facility] [address] [place], [address] Phone: [phone]   Fax:  [phone]  Name: Cuba Natarajan MRN: [id number] Date of Birth: 11/25/1956

## 2022-02-21 ENCOUNTER — Other Ambulatory Visit: Payer: Self-pay

## 2022-02-21 ENCOUNTER — Ambulatory Visit: Payer: 59 | Attending: Student | Admitting: Occupational Therapy

## 2022-02-21 DIAGNOSIS — M79601 Pain in right arm: Secondary | ICD-10-CM | POA: Diagnosis present

## 2022-02-21 DIAGNOSIS — M25631 Stiffness of right wrist, not elsewhere classified: Secondary | ICD-10-CM

## 2022-02-21 DIAGNOSIS — M6281 Muscle weakness (generalized): Secondary | ICD-10-CM | POA: Diagnosis present

## 2022-02-21 DIAGNOSIS — M25621 Stiffness of right elbow, not elsewhere classified: Secondary | ICD-10-CM

## 2022-02-21 DIAGNOSIS — G5631 Lesion of radial nerve, right upper limb: Secondary | ICD-10-CM | POA: Diagnosis present

## 2022-02-21 DIAGNOSIS — L905 Scar conditions and fibrosis of skin: Secondary | ICD-10-CM

## 2022-02-21 NOTE — Therapy (Signed)
[place] [facility] [address] [place], [address] Phone: [phone]   Fax:  [phone]  Occupational Therapy Treatment  Patient Details  Name: Taquisha Phung MRN: [id number] Date of Birth: [birth year] Referring Provider (OT): DR Lynnae January Date: 02/21/2022   OT End of Session - 02/21/22 1120     Visit Number 8    Number of Visits 16    Date for OT Re-Evaluation 03/14/22    OT Start Time 1120    OT Stop Time 1207    OT Time Calculation (min) 47 min    Activity Tolerance Patient tolerated treatment well    Behavior During Therapy [facility] for tasks assessed/performed             Past Medical History:  Diagnosis Date   Hypertension    Kidney stones    Mitral valve prolapse     Past Surgical History:  Procedure Laterality Date   KIDNEY CYST REMOVAL     LITHOTRIPSY     TONSILLECTOMY      There were no vitals filed for this visit.   Subjective Assessment - 02/21/22 1120     Subjective  I had a rough week, with not seeing you last week. The pain now on the outside of my elbow. Did see the surgeon.   I am seeing him not until May again, to cont with you    Pertinent History Right radial tunnel release. (Right)  Right open ECRB release. (Right)    Date of Surgery: December 29, 2021    Surgeon: Janalyn Rouse, MD     Subjective:     Mairead Schwarzkopf is a 66 y.o. female who presents today for wound check 01/10/22 . Patient has a surgical incision wound which is located on the right antecubital fossa. Current symptoms: no wound issues Pain is rated 8/10. 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 Yes    Pain Score 8     Pain Location Elbow    Pain Orientation Right    Pain Descriptors / Indicators Tender    Pain Type Acute  pain;Chronic pain    Pain Onset More than a month ago    Pain Frequency Constant                         Skin check done prior.  Patient to keep patch on a hour afterwards.  Tolerated ionto well.  No issues recorded.   OT Treatments/Exercises (OP) - 02/21/22 0001       Iontophoresis   Type of Iontophoresis Dexamethasone    Location R lateral epicondyle    Dose med patch , 2.0 current    Time 19      RUE Contrast Bath   Time 8 minutes    Comments decrease pain , prior to ROM              Patient arrive after not seen last week.  Patient seen surgeon Febr 27th.  Progressing very well after radial nerve decompression surgery.  Still having issues with lateral epicondylitis. Pain increased this date-tenderness over right epicondyle -patient reports pain with twisting, gripping,  lifting, pushing.  Patient education done on lifting with palm and light gripping.  We will assess if she can benefit from counterforce strap, considering she had radial nerve decompression surgery.  After contrast done soft tissue massage over the dorsal forearm using Grasston tool #2. Cross friction massage, patient added on doing at home as well as contrast Elbow active range of motion wrist active range of motion continues to be within normal limits-slight pull over the dorsal forearm with wrist flexion.        OT Education - 02/21/22 1120     Education Details progress and HEP    Person(s) Educated Patient    Methods Explanation;Demonstration;Tactile cues;Verbal cues;Handout    Comprehension Verbal cues required;Returned demonstration;Verbalized understanding                 OT Long Term Goals - 01/17/22 1546       OT LONG TERM GOAL #1   Title Pt to be independent in HEP to decrease pain to less than 2/10 in wrist, forearm and elbow AROM    Baseline pain 2-4/10 at the best and resting in session 7/10 - increase with AROM to 8/10  end range elbow , sup and wrist ext and  RD    Time 3    Period Weeks    Status New    Target Date 02/07/22      OT LONG TERM GOAL #2   Title Pt show pain less than 2/10 with R elbow, forearm and wrist AROM in all planes to use in more than 50% of  ADL's with more ease    Baseline pain 2-8/10 - but with use or AROM 7-8/10    Time 5    Period Weeks    Status New    Target Date 02/21/22      OT LONG TERM GOAL #3   Title R UE AROM and strength increase to [facility] to use R arm in ADL's without increase symptoms    Baseline pain increase to 7-8/10 with AROM elbow , sup , wrist ext and RD -and gripping - NOW PULLING , pushing , gripping -per surgeon order  - 21/2 wks s/p    Time 8    Period Weeks    Status New    Target Date 03/14/22                   Plan - 02/21/22 1120     Clinical Impression Statement Pt refer to OT after having  R radial N and ECRB release surgery on 12/29/21 - pt is 7  wks s/p. She made great progress in AROM for elbow flexion , ext and forearm supination  WNL. and able to do radial N glide without pain - had increase flexibility. Most of her pain now at lateral epicondyle 6-8/10 pain - tenderness and tight with slight pull over extensors of forearm - had some symptoms prior to surgery. Pt seen surgeon on 02/14/22 to cont  OT - but was not seen last week - initiated ionto again this date with dexamethazone -tolerate well Pt to wear cica scar pad night time and compression. Pt is R hand dominant   Pt was ed on no pulling , pusing and lifting - to follow surgeon order.  COnt to decrease pain and scar tissue -and then increase strength  to increase functional use of R dominant hand in ADL's and IADL's. Assess if can wear counter force strap .    OT  Occupational Profile and History Problem Focused Assessment - Including review of records relating to presenting problem    Occupational performance deficits (Please refer to evaluation for details): ADL's;IADL's;Work;Play;Leisure;Social Participation    Body  Structure / Function / Physical Skills ADL;Decreased knowledge of use of DME;Strength;Pain;UE functional use;ROM;IADL;Decreased knowledge of precautions;Flexibility    Rehab Potential Fair    Clinical Decision Making Limited treatment options, no task modification necessary    Comorbidities Affecting Occupational Performance: None    Modification or Assistance to Complete Evaluation  No modification of tasks or assist necessary to complete eval    OT Frequency 2x / week    OT Duration 6 weeks    OT Treatment/Interventions Self-care/ADL training;Ultrasound;Contrast Bath;Therapeutic exercise;Manual Therapy;Patient/family education;Passive range of motion;Therapeutic activities;Splinting;DME and/or AE instruction;Scar mobilization;Paraffin;Iontophoresis    Consulted and Agree with Plan of Care Patient             Patient will benefit from skilled therapeutic intervention in order to improve the following deficits and impairments:   Body Structure / Function / Physical Skills: ADL, Decreased knowledge of use of DME, Strength, Pain, UE functional use, ROM, IADL, Decreased knowledge of precautions, Flexibility       Visit Diagnosis: Stiffness of right wrist, not elsewhere classified  Stiffness of right elbow, not elsewhere classified  Pain in right arm  Scar condition and fibrosis of skin  Muscle weakness (generalized)    Problem List Patient Active Problem List   Diagnosis Date Noted   Lateral antebrachial cutaneous nerve injury, sequela (Right) 10/06/2021   Right forearm pain 10/06/2021   Nerve entrapment syndrome of arm (Right) 10/06/2021   Chronic pain syndrome 10/05/2021   Pharmacologic therapy 10/05/2021   Disorder of skeletal system 10/05/2021   Problems influencing health status 10/05/2021   Encounter for chronic pain management 10/05/2021   Chronic upper extremity pain (1ry area of Pain) (Right) 03/05/2021   Chest pain 05/17/2017   Accelerated hypertension  05/17/2017    Oletta Cohn, OTR/L,CLT 02/21/2022, 1:04 PM  [place] [facility] [address] [place], [address] Phone: [phone]   Fax:  [phone]  Name: Laguita Bromberg MRN: [id number] Date of Birth: 22-Oct-1956

## 2022-02-24 ENCOUNTER — Ambulatory Visit: Payer: 59 | Admitting: Occupational Therapy

## 2022-02-24 ENCOUNTER — Other Ambulatory Visit: Payer: Self-pay

## 2022-02-24 DIAGNOSIS — M79601 Pain in right arm: Secondary | ICD-10-CM

## 2022-02-24 DIAGNOSIS — M6281 Muscle weakness (generalized): Secondary | ICD-10-CM

## 2022-02-24 DIAGNOSIS — M25631 Stiffness of right wrist, not elsewhere classified: Secondary | ICD-10-CM | POA: Diagnosis not present

## 2022-02-24 DIAGNOSIS — G5631 Lesion of radial nerve, right upper limb: Secondary | ICD-10-CM

## 2022-02-24 DIAGNOSIS — L905 Scar conditions and fibrosis of skin: Secondary | ICD-10-CM

## 2022-02-24 DIAGNOSIS — M25621 Stiffness of right elbow, not elsewhere classified: Secondary | ICD-10-CM

## 2022-02-24 NOTE — Therapy (Signed)
[place] [facility] [address], [place], [address] Phone: [phone]   Fax:  [phone]  Occupational Therapy Treatment  Patient Details  Name: Rachel Hahn MRN: [id number] Date of Birth: [birth year] Referring Provider (OT): DR Lynnae January Date: 02/24/2022   OT End of Session - 02/24/22 1150     Visit Number 9    Number of Visits 16    Date for OT Re-Evaluation 03/14/22    OT Start Time 1115    OT Stop Time 1205    OT Time Calculation (min) 50 min    Activity Tolerance Patient tolerated treatment well    Behavior During Therapy [facility] for tasks assessed/performed             Past Medical History:  Diagnosis Date   Hypertension    Kidney stones    Mitral valve prolapse     Past Surgical History:  Procedure Laterality Date   KIDNEY CYST REMOVAL     LITHOTRIPSY     TONSILLECTOMY      There were no vitals filed for this visit.   Subjective Assessment - 02/24/22 1135     Subjective  Pain is better -paying attention how I pick up and carry things    Pertinent History Right radial tunnel release. (Right)  Right open ECRB release. (Right)    Date of Surgery: December 29, 2021    Surgeon: Janalyn Rouse, MD     Subjective:     Rachel Hahn is a 66 y.o. female who presents today for wound check 01/10/22 . Patient has a surgical incision wound which is located on the right antecubital fossa. Current symptoms: no wound issues Pain is rated 8/10. 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 Yes    Pain Score 1     Pain Location Elbow    Pain Orientation Right    Pain Descriptors / Indicators Tender    Pain Type Acute pain    Pain Onset More than a month ago                     Patient arrives this date with  less pain in the right elbow-report 1 out of 10 but still pain when pulling or twisting and pinching She used counterforce strap in clinic picking up 1 pound weight-had less pain Recommend for patient to pick up one at pharmacy educated on donning and wearing at correct position     Skin check done prior to ionto, tolerated well last time Patient to keep patch on for hour after done     OT Treatments/Exercises (OP) - 02/24/22 0001       Moist Heat Therapy   Number Minutes Moist Heat 6 Minutes    Moist Heat Location Elbow;Wrist      Iontophoresis   Type of Iontophoresis Dexamethasone    Location R lateral epicondyle    Dose med patch, 2.0 current    Time 19           Pain increased  earlier this week -tenderness over right epicondyle -patient reports pain with twisting, gripping, lifting, pushing.  Patient education done on lifting with  palm and light gripping.  Assess if she can benefit from counterforce strap, considering she had radial nerve decompression surgery, done well today.  After heat done soft tissue massage over the dorsal forearm using Grasston tool #2. Cross friction massage, patient added on doing at home as well as contrast And forearm extensor stretches by OT -  with elbow extended - tolerated well  Elbow active range of motion wrist active range of motion continues to be within normal limits-slight pull over the dorsal forearm with wrist flexion.        OT Education - 02/24/22 1150     Education Details progress and HEP    Person(s) Educated Patient    Methods Explanation;Demonstration;Tactile cues;Verbal cues;Handout    Comprehension Verbal cues required;Returned demonstration;Verbalized understanding                 OT Long Term Goals - 01/17/22 1546       OT LONG TERM GOAL #1   Title Pt to be independent in HEP to decrease pain to less than 2/10 in wrist, forearm and elbow AROM    Baseline pain 2-4/10 at the best and resting in session  7/10 - increase with AROM to 8/10  end range elbow , sup and wrist ext and RD    Time 3    Period Weeks    Status New    Target Date 02/07/22      OT LONG TERM GOAL #2   Title Pt show pain less than 2/10 with R elbow, forearm and wrist AROM in all planes to use in more than 50% of  ADL's with more ease    Baseline pain 2-8/10 - but with use or AROM 7-8/10    Time 5    Period Weeks    Status New    Target Date 02/21/22      OT LONG TERM GOAL #3   Title R UE AROM and strength increase to [facility] to use R arm in ADL's without increase symptoms    Baseline pain increase to 7-8/10 with AROM elbow , sup , wrist ext and RD -and gripping - NOW PULLING , pushing , gripping -per surgeon order  - 21/2 wks s/p    Time 8    Period Weeks    Status New    Target Date 03/14/22                   Plan - 02/24/22 1152     Clinical Impression Statement Pt refer to OT after having  R radial N and ECRB release surgery on 12/29/21 - pt is 7 1/2   wks s/p. She made great progress in AROM for elbow flexion , ext and forearm supination  WNL. and able to do radial N glide without pain - had increase flexibility. Most of her pain now at lateral epicondyle but this date coming in 2/10 but increase with certain movements still to  6-8/10 pain - tenderness and tight with slight pull over extensors of forearm but better than last time. Pt seen surgeon on 02/14/22 to cont  OT - but was not seen last week - initiated ionto again this week with dexamethazone -tolerate well Pt to wear cica scar pad night time and compression. Pt is R hand dominant   Pt was ed on lateral epicondyle modification and ed on use of counter force strap - but not tight and to use when using R UE for cleaning or dog to  decrease pain.  COnt to decrease pain and scar tissue -and then increase strength  to increase functional use of R dominant hand in ADL's and IADL's. Assess if can wear counter force strap .    OT Occupational Profile and History  Problem Focused Assessment - Including review of records relating to presenting problem    Occupational performance deficits (Please refer to evaluation for details): ADL's;IADL's;Work;Play;Leisure;Social Participation    Body Structure / Function / Physical Skills ADL;Decreased knowledge of use of DME;Strength;Pain;UE functional use;ROM;IADL;Decreased knowledge of precautions;Flexibility    Rehab Potential Fair    Clinical Decision Making Limited treatment options, no task modification necessary    Comorbidities Affecting Occupational Performance: None    OT Frequency 2x / week    OT Duration 6 weeks    OT Treatment/Interventions Self-care/ADL training;Ultrasound;Contrast Bath;Therapeutic exercise;Manual Therapy;Patient/family education;Passive range of motion;Therapeutic activities;Splinting;DME and/or AE instruction;Scar mobilization;Paraffin;Iontophoresis    Consulted and Agree with Plan of Care Patient             Patient will benefit from skilled therapeutic intervention in order to improve the following deficits and impairments:   Body Structure / Function / Physical Skills: ADL, Decreased knowledge of use of DME, Strength, Pain, UE functional use, ROM, IADL, Decreased knowledge of precautions, Flexibility       Visit Diagnosis: Stiffness of right wrist, not elsewhere classified  Stiffness of right elbow, not elsewhere classified  Pain in right arm  Scar condition and fibrosis of skin  Muscle weakness (generalized)  Radial nerve compression, right    Problem List Patient Active Problem List   Diagnosis Date Noted   Lateral antebrachial cutaneous nerve injury, sequela (Right) 10/06/2021   Right forearm pain 10/06/2021   Nerve entrapment syndrome of arm (Right) 10/06/2021   Chronic pain syndrome 10/05/2021   Pharmacologic therapy 10/05/2021   Disorder of skeletal system 10/05/2021   Problems influencing health status 10/05/2021   Encounter for chronic pain  management 10/05/2021   Chronic upper extremity pain (1ry area of Pain) (Right) 03/05/2021   Chest pain 05/17/2017   Accelerated hypertension 05/17/2017    Oletta Cohn, OTR/L,CLT 02/24/2022, 11:56 AM  [place] [facility] [address] [place], [address] Phone: [phone]   Fax:  [phone]  Name: Rachel Hahn MRN: [id number] Date of Birth: 07/02/1956

## 2022-02-28 ENCOUNTER — Other Ambulatory Visit: Payer: Self-pay

## 2022-02-28 ENCOUNTER — Ambulatory Visit: Payer: 59 | Admitting: Occupational Therapy

## 2022-02-28 DIAGNOSIS — M25631 Stiffness of right wrist, not elsewhere classified: Secondary | ICD-10-CM | POA: Diagnosis not present

## 2022-02-28 DIAGNOSIS — M6281 Muscle weakness (generalized): Secondary | ICD-10-CM

## 2022-02-28 DIAGNOSIS — M25621 Stiffness of right elbow, not elsewhere classified: Secondary | ICD-10-CM

## 2022-02-28 DIAGNOSIS — M79601 Pain in right arm: Secondary | ICD-10-CM

## 2022-02-28 NOTE — Therapy (Signed)
[place] [facility] [address] [place], [address] Phone: [phone]   Fax:  [phone]  Occupational Therapy Treatment  Patient Details  Name: Rachel Hahn MRN: [id number] Date of Birth: [birth year] Referring Provider (OT): DR Lynnae January Date: 02/28/2022   OT End of Session - 02/28/22 1428     Visit Number 10    Number of Visits 16    Date for OT Re-Evaluation 03/14/22    OT Start Time 1115    OT Stop Time 1200    OT Time Calculation (min) 45 min    Activity Tolerance Patient tolerated treatment well    Behavior During Therapy [facility] for tasks assessed/performed             Past Medical History:  Diagnosis Date   Hypertension    Kidney stones    Mitral valve prolapse     Past Surgical History:  Procedure Laterality Date   KIDNEY CYST REMOVAL     LITHOTRIPSY     TONSILLECTOMY      There were no vitals filed for this visit.   Subjective Assessment - 02/28/22 1427     Subjective  Pain is better than last week -but still bothers me at the elbow - yesterday with the cold and wet weather it was worse    Pertinent History Right radial tunnel release. (Right)  Right open ECRB release. (Right)    Date of Surgery: December 29, 2021    Surgeon: Janalyn Rouse, MD     Subjective:     Rachel Hahn is a 66 y.o. female who presents today for wound check 01/10/22 . Patient has a surgical incision wound which is located on the right antecubital fossa. Current symptoms: no wound issues Pain is rated 8/10. 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 Yes    Pain Score 2     Pain Location Elbow    Pain Orientation Right    Pain Descriptors / Indicators Tender    Pain Type Acute pain    Pain Onset More than a month ago                 Patient arrives this date with less pain in the right elbow-report 2 out of 10 but still pain  increase when pulling or twisting and pinching She used counterforce strap in clinic  last time picking up 1 pound weight-had less pain Recommend for patient to pick up one at pharmacy educated on donning and wearing at correct position - she report could not get one       Skin check done prior to ionto, tolerated well last time Patient to keep patch on for hour after done                    OT Treatments/Exercises (OP) - 02/28/22 0001       Moist Heat Therapy   Number Minutes Moist Heat 6 Minutes    Moist Heat Location --   elbow     Iontophoresis   Type of Iontophoresis Dexamethasone    Location R lateral epicondyle    Dose med patch, 2.0 current  Time 19            patient reports  cont pain with twisting, gripping, lifting, pushing.  Patient education done on lifting with palm and light gripping.  Assess if she can benefit from counterforce strap, considering she had radial nerve decompression surgery, done well today. Recommend again  After heat done soft tissue massage over the dorsal forearm using Grasston tool #2. Cross friction massage, patient added on doing at home as well as contrast And forearm extensor stretches by OT -  with elbow extended - tolerated well  Elbow active range of motion wrist active range of motion continues to be within normal limits-slight pull over the dorsal forearm with wrist flexion but better           OT Education - 02/28/22 1428     Education Details progress and HEP    Person(s) Educated Patient    Methods Explanation;Demonstration;Tactile cues;Verbal cues;Handout    Comprehension Verbal cues required;Returned demonstration;Verbalized understanding                 OT Long Term Goals - 01/17/22 1546       OT LONG TERM GOAL #1   Title Pt to be independent in HEP to decrease pain to less than  2/10 in wrist, forearm and elbow AROM    Baseline pain 2-4/10 at the best and resting in session 7/10 - increase with AROM to 8/10  end range elbow , sup and wrist ext and RD    Time 3    Period Weeks    Status New    Target Date 02/07/22      OT LONG TERM GOAL #2   Title Pt show pain less than 2/10 with R elbow, forearm and wrist AROM in all planes to use in more than 50% of  ADL's with more ease    Baseline pain 2-8/10 - but with use or AROM 7-8/10    Time 5    Period Weeks    Status New    Target Date 02/21/22      OT LONG TERM GOAL #3   Title R UE AROM and strength increase to [facility] to use R arm in ADL's without increase symptoms    Baseline pain increase to 7-8/10 with AROM elbow , sup , wrist ext and RD -and gripping - NOW PULLING , pushing , gripping -per surgeon order  - 21/2 wks s/p    Time 8    Period Weeks    Status New    Target Date 03/14/22                   Plan - 02/28/22 1429     Clinical Impression Statement Pt refer to OT after having  R radial N and ECRB release surgery on 12/29/21 - pt is 8   wks s/p. She made great progress in AROM for elbow flexion , ext and forearm supination  WNL. and able to do radial N glide without pain - had increase flexibility. Most of her pain now at lateral epicondyle but  improving - this date 2/10 but increase with certain movements still to  6-8/10 pain - tenderness over lateral epincondyle , wrist extention - but tightness improving over  extensors of forearm. Pt seen surgeon on 02/14/22 to cont  OT - but was not seen 2 wks ago - initiated ionto again last week with dexamethazone -tolerate well. Pt is R hand dominant   Pt was  ed on lateral epicondyle modification and ed on use of counter force strap - but not tight and to use when using R UE for cleaning or dog to decrease pain.  Pt report she could not find one.  COnt to decrease pain with increase ROM and strength -  to increase functional use of R dominant hand in ADL's and  IADL's. Assess fit of  counter force strap .    OT Occupational Profile and History Problem Focused Assessment - Including review of records relating to presenting problem    Occupational performance deficits (Please refer to evaluation for details): ADL's;IADL's;Work;Play;Leisure;Social Participation    Body Structure / Function / Physical Skills ADL;Decreased knowledge of use of DME;Strength;Pain;UE functional use;ROM;IADL;Decreased knowledge of precautions;Flexibility    Rehab Potential Fair    Clinical Decision Making Limited treatment options, no task modification necessary    Comorbidities Affecting Occupational Performance: None    Modification or Assistance to Complete Evaluation  No modification of tasks or assist necessary to complete eval    OT Frequency 2x / week    OT Duration 6 weeks    OT Treatment/Interventions Self-care/ADL training;Ultrasound;Contrast Bath;Therapeutic exercise;Manual Therapy;Patient/family education;Passive range of motion;Therapeutic activities;Splinting;DME and/or AE instruction;Scar mobilization;Paraffin;Iontophoresis    Consulted and Agree with Plan of Care Patient             Patient will benefit from skilled therapeutic intervention in order to improve the following deficits and impairments:   Body Structure / Function / Physical Skills: ADL, Decreased knowledge of use of DME, Strength, Pain, UE functional use, ROM, IADL, Decreased knowledge of precautions, Flexibility       Visit Diagnosis: Stiffness of right elbow, not elsewhere classified  Pain in right arm  Muscle weakness (generalized)    Problem List Patient Active Problem List   Diagnosis Date Noted   Lateral antebrachial cutaneous nerve injury, sequela (Right) 10/06/2021   Right forearm pain 10/06/2021   Nerve entrapment syndrome of arm (Right) 10/06/2021   Chronic pain syndrome 10/05/2021   Pharmacologic therapy 10/05/2021   Disorder of skeletal system 10/05/2021   Problems  influencing health status 10/05/2021   Encounter for chronic pain management 10/05/2021   Chronic upper extremity pain (1ry area of Pain) (Right) 03/05/2021   Chest pain 05/17/2017   Accelerated hypertension 05/17/2017    Oletta Cohn, OTR/L,CLT 02/28/2022, 2:33 PM  [facility] [address], [place], [address] Phone: [phone]   Fax:  [phone]  Name: Rachel Hahn MRN: [id number] Date of Birth: October 14, 1956

## 2022-03-03 ENCOUNTER — Other Ambulatory Visit: Payer: Self-pay

## 2022-03-03 ENCOUNTER — Ambulatory Visit: Payer: 59 | Admitting: Occupational Therapy

## 2022-03-03 DIAGNOSIS — M25631 Stiffness of right wrist, not elsewhere classified: Secondary | ICD-10-CM | POA: Diagnosis not present

## 2022-03-03 NOTE — Therapy (Signed)
[place] ?[facility] ?[address] ?[place], [place], [address] ?Phone: [phone]   Fax:  [phone] ? ?Occupational Therapy Treatment ? ?Patient Details  ?Name: Rachel Hahn ?MRN: [id number] ?Date of Birth: 1956/02/25 ?Referring Provider (OT): DR Dierdre Searles ? ? ?Encounter Date: 03/03/2022 ? ? OT End of Session - 03/03/22 1121   ? ? Visit Number 11   ? Number of Visits 16   ? Date for OT Re-Evaluation 03/14/22   ? OT Start Time 1119   ? OT Stop Time 1210   ? OT Time Calculation (min) 51 min   ? Activity Tolerance Patient tolerated treatment well   ? Behavior During Therapy [facility] for tasks assessed/performed   ? ?  ?  ? ?  ? ? ?Past Medical History:  ?Diagnosis Date  ? Hypertension   ? Kidney stones   ? Mitral valve prolapse   ? ? ?Past Surgical History:  ?Procedure Laterality Date  ? KIDNEY CYST REMOVAL    ? LITHOTRIPSY    ? TONSILLECTOMY    ? ? ?There were no vitals filed for this visit. ? ? Subjective Assessment - 03/03/22 1121   ? ? Subjective  My elbow is not good if it is cold like today and yesterday -when I reach out or twist that is when it hurts - or picking up objects that is heavy   ? Pertinent History Right radial tunnel release. (Right)  Right open ECRB release. (Right)    Date of Surgery: December 29, 2021    Surgeon: Janalyn Rouse, MD     Subjective:     Rachel Hahn is a 66 y.o. female who presents today for wound check 01/10/22 . Patient has a surgical incision wound which is located on the right antecubital fossa. Current symptoms: no wound issues Pain is rated 8/10. 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 Patient Stated Goals I just want the pain and use better in my R arm so I can use it   ? Currently in Pain? Yes   ? Pain Score 7    ? Pain Location Elbow   ? Pain Orientation Right   ? Pain Descriptors / Indicators Tender   ? Pain Type Acute pain   ? Pain Onset  More than a month ago   ? ?  ?  ? ?  ? ? ? ? ? ? ?She used counterforce strap in clinic   last week  picking up 1 pound weight-had less pain ?Recommend for patient to pick up one at pharmacy educated on donning and wearing at correct position - she report could not get one earlier this week ?Will look for it again this weekend ? Reviewed again with pt use and donning of one -simulated on in the clinic  ?Ed on application and use ?  ?  ?  Skin check done prior to ionto, tolerated well  ?Patient to keep patch on for hour after done ?  ?  ?  ?  ?  ?  ?  ?  ?  ?  ?  ? ?  ?  ?  ?  ?  ?  ? ? ? ? ? ? ? ? OT Treatments/Exercises (OP) - 03/03/22 0001   ? ?  ? Moist Heat Therapy  ? Number Minutes Moist Heat 6  Minutes   ? Moist Heat Location Elbow;Wrist   prior to ROM and stretches  ?  ? Iontophoresis  ? Type of Iontophoresis Dexamethasone   ? Location R lateral epicondyle   ? Dose med patch, 2.0 current   ? Time 19   ? ?  ?  ? ?  ? ? ? ?Patient reports  cont pain with twisting, gripping, lifting, pushing.  Patient education done on lifting with palm and light gripping and better close to her body.  Assess if she can benefit from counterforce strap, considering she had radial nerve decompression surgery, done well today. Recommend again to get one and will ed on donning and wearing next visit ? ?After heat done soft tissue massage over the dorsal forearm using Grasston tool #2. ?Cross friction massage, patient added on doing at home as well as contrast ?And forearm extensor stretches by OT -  with elbow extended - tolerated well - improved  ?Elbow active range of motion wrist active range of motion continues to be within normal limits-slight pull over the dorsal forearm with wrist flexion but better  ?  ? ? ? ? ? OT Education - 03/03/22 1121   ? ? Education Details progress and HEP   ? Person(s) Educated Patient   ? Methods Explanation;Demonstration;Tactile cues;Verbal cues;Handout   ? Comprehension Verbal cues  required;Returned demonstration;Verbalized understanding   ? ?  ?  ? ?  ? ? ? ? ? ? OT Long Term Goals - 01/17/22 1546   ? ?  ? OT LONG TERM GOAL #1  ? Title Pt to be independent in HEP to decrease pain to less than 2/10 in wrist, forearm and elbow AROM   ? Baseline pain 2-4/10 at the best and resting in session 7/10 - increase with AROM to 8/10  end range elbow , sup and wrist ext and RD   ? Time 3   ? Period Weeks   ? Status New   ? Target Date 02/07/22   ?  ? OT LONG TERM GOAL #2  ? Title Pt show pain less than 2/10 with R elbow, forearm and wrist AROM in all planes to use in more than 50% of  ADL's with more ease   ? Baseline pain 2-8/10 - but with use or AROM 7-8/10   ? Time 5   ? Period Weeks   ? Status New   ? Target Date 02/21/22   ?  ? OT LONG TERM GOAL #3  ? Title R UE AROM and strength increase to [facility] to use R arm in ADL's without increase symptoms   ? Baseline pain increase to 7-8/10 with AROM elbow , sup , wrist ext and RD -and gripping - NOW PULLING , pushing , gripping -per surgeon order  - 21/2 wks s/p   ? Time 8   ? Period Weeks   ? Status New   ? Target Date 03/14/22   ? ?  ?  ? ?  ? ? ? ? ? ? ? ? Plan - 03/03/22 1157   ? ? Clinical Impression Statement Pt refer to OT after having  R radial N and ECRB release surgery on 12/29/21 - pt is 8  1/2 wks s/p. She made great progress in AROM for elbow flexion , ext and forearm supination  WNL. and able to do radial N glide without pain - had increase flexibility. Her pain now is at lateral epicondyle - she is coming in today with  pain again 7/10 but have colder weather the last week. Pain was last time 2/10 but increase with certain movements still to  6-8/10 pain -Tightness improving over  extensors of forearm with extended elbow. Pt seen surgeon on 02/14/22 to cont  OT - Initiated Ionto again last week with dexamethazone -tolerate well - 4th session today. Pt is R hand dominant   Pt was ed on lateral epicondyle modification and ed on use of counter force  strap - but not tight and to use when using R UE for cleaning or dog to decrease pain.  Pt report she could not find one will look again this weekend.  COnt to decrease pain with increase ROM and strength -  to increase functional use of R dominant hand in ADL's and IADL's. Assess fit of  counter force strap .   ? OT Occupational Profile and History Problem Focused Assessment - Including review of records relating to presenting problem   ? Occupational performance deficits (Please refer to evaluation for details): ADL's;IADL's;Work;Play;Leisure;Social Participation   ? Body Structure / Function / Physical Skills ADL;Decreased knowledge of use of DME;Strength;Pain;UE functional use;ROM;IADL;Decreased knowledge of precautions;Flexibility   ? Rehab Potential Fair   ? Clinical Decision Making Limited treatment options, no task modification necessary   ? Comorbidities Affecting Occupational Performance: None   ? Modification or Assistance to Complete Evaluation  No modification of tasks or assist necessary to complete eval   ? OT Frequency 2x / week   ? OT Duration 6 weeks   ? OT Treatment/Interventions Self-care/ADL training;Ultrasound;Contrast Bath;Therapeutic exercise;Manual Therapy;Patient/family education;Passive range of motion;Therapeutic activities;Splinting;DME and/or AE instruction;Scar mobilization;Paraffin;Iontophoresis   ? Consulted and Agree with Plan of Care Patient   ? ?  ?  ? ?  ? ? ?Patient will benefit from skilled therapeutic intervention in order to improve the following deficits and impairments:   ?Body Structure / Function / Physical Skills: ADL, Decreased knowledge of use of DME, Strength, Pain, UE functional use, ROM, IADL, Decreased knowledge of precautions, Flexibility ?  ?  ? ? ?Visit Diagnosis: ?Stiffness of right elbow, not elsewhere classified ? ?Pain in right arm ? ?Muscle weakness (generalized) ? ?Stiffness of right wrist, not elsewhere classified ? ?Scar condition and fibrosis of  skin ? ?Radial nerve compression, right ? ? ? ?Problem List ?Patient Active Problem List  ? Diagnosis Date Noted  ? Lateral antebrachial cutaneous nerve injury, sequela (Right) 10/06/2021  ? Right forearm pain 10/06/2021  ?

## 2022-03-07 ENCOUNTER — Ambulatory Visit: Payer: 59 | Admitting: Occupational Therapy

## 2022-03-07 ENCOUNTER — Other Ambulatory Visit: Payer: Self-pay

## 2022-03-07 DIAGNOSIS — M6281 Muscle weakness (generalized): Secondary | ICD-10-CM

## 2022-03-07 DIAGNOSIS — M25631 Stiffness of right wrist, not elsewhere classified: Secondary | ICD-10-CM | POA: Diagnosis not present

## 2022-03-07 DIAGNOSIS — M79601 Pain in right arm: Secondary | ICD-10-CM

## 2022-03-07 DIAGNOSIS — M25621 Stiffness of right elbow, not elsewhere classified: Secondary | ICD-10-CM

## 2022-03-07 DIAGNOSIS — L905 Scar conditions and fibrosis of skin: Secondary | ICD-10-CM

## 2022-03-07 DIAGNOSIS — G5631 Lesion of radial nerve, right upper limb: Secondary | ICD-10-CM

## 2022-03-07 NOTE — Therapy (Signed)
[place] ?[facility] ?[address] ?[place], [place], [address] ?Phone: [phone]   Fax:  [phone] ? ?Occupational Therapy Treatment ? ?Patient Details  ?Name: Rachel Hahn ?MRN: [id number] ?Date of Birth: 04-01-1956 ?Referring Provider (OT): DR Dierdre Searles ? ? ?Encounter Date: 03/07/2022 ? ? OT End of Session - 03/07/22 0905   ? ? Visit Number 12   ? Number of Visits 16   ? Date for OT Re-Evaluation 03/14/22   ? OT Start Time [phone]   ? OT Stop Time 0950   ? OT Time Calculation (min) 45 min   ? Activity Tolerance Patient tolerated treatment well   ? Behavior During Therapy [facility] for tasks assessed/performed   ? ?  ?  ? ?  ? ? ?Past Medical History:  ?Diagnosis Date  ? Hypertension   ? Kidney stones   ? Mitral valve prolapse   ? ? ?Past Surgical History:  ?Procedure Laterality Date  ? KIDNEY CYST REMOVAL    ? LITHOTRIPSY    ? TONSILLECTOMY    ? ? ?There were no vitals filed for this visit. ? ? Subjective Assessment - 03/07/22 0905   ? ? Subjective  I got elbow strap tried to put on but felt it did not help for the pain - this cold weather not helping my elbow   ? Pertinent History Right radial tunnel release. (Right)  Right open ECRB release. (Right)    Date of Surgery: December 29, 2021    Surgeon: Janalyn Rouse, MD     Subjective:     Rachel Hahn is a 66 y.o. female who presents today for wound check 01/10/22 . Patient has a surgical incision wound which is located on the right antecubital fossa. Current symptoms: no wound issues Pain is rated 8/10. 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 Patient Stated Goals I just want the pain and use better in my R arm so I can use it   ? Currently in Pain? Yes   ? Pain Score 9    ? Pain Location Elbow   ? Pain Orientation Right   ? Pain Descriptors / Indicators Tender   ? Pain Type Acute pain   ? Pain Onset More than a month ago   ? ?  ?  ? ?   ? ? ? ? ? [facility] OT Assessment - 03/07/22 0001   ? ?  ? Strength  ? Right Hand Grip (lbs) 10   extended10 pain  ? Right Hand Lateral Pinch 11 lbs   ? Right Hand 3 Point Pinch 10 lbs   ? Left Hand Grip (lbs) 38   extended 40  ? Left Hand Lateral Pinch 14 lbs   ? Left Hand 3 Point Pinch 14 lbs   ? ?  ?  ? ?  ?R Grip decrease and with increase pain  ? but prehension about the same  ? ?She used  simulated counter force strap in clinic  picking up 1 pound weight-had less pain ?Recommend for patient to pick up one at pharmacy educated on donning and wearing at correct position - she report could not get one earlier this week ?Will look for it again this weekend but forgot it at home ? Reviewed again with pt use and donning of one -simulated on in the clinic  ?  Ed on application and use ?  ?  ?  Skin check done prior to ionto, tolerated well  ?Patient to keep patch on for hour after done ?  ? ? ? ? ? ? ? ? OT Treatments/Exercises (OP) - 03/07/22 0001   ? ?  ? Moist Heat Therapy  ? Number Minutes Moist Heat 6 Minutes   ? Moist Heat Location Elbow;Wrist   prior to stretches  ?  ? Iontophoresis  ? Type of Iontophoresis Dexamethasone   ? Location R lateral epicondyle   ? Dose med patch, 2.0 current   ? Time 19   ? ?  ?  ? ?  ? ?  ?Patient reports  cont pain with twisting, gripping, lifting, pushing.  Patient education done on lifting with palm and light gripping and better close to her body.  Assess if she can benefit from counterforce strap, considering she had radial nerve decompression surgery, done well today. Recommend again to get one and will ed on donning and wearing next visit ? ?After heat done soft tissue massage over the dorsal forearm using Grasston tool #2. ?Cross friction massage, patient added on doing at home as well as contrast ?And forearm extensor stretches by OT -  with elbow extended - tolerated well - improved  ?Elbow active range of motion wrist active range of motion continues to be within normal  limits-slight pull over the dorsal forearm with wrist flexion but better  ? ? ? ? ? ? ? OT Education - 03/07/22 0905   ? ? Education Details progress and HEP   ? Person(s) Educated Patient   ? Methods Explanation;Demonstration;Tactile cues;Verbal cues;Handout   ? Comprehension Verbal cues required;Returned demonstration;Verbalized understanding   ? ?  ?  ? ?  ? ? ? ? ? ? OT Long Term Goals - 01/17/22 1546   ? ?  ? OT LONG TERM GOAL #1  ? Title Pt to be independent in HEP to decrease pain to less than 2/10 in wrist, forearm and elbow AROM   ? Baseline pain 2-4/10 at the best and resting in session 7/10 - increase with AROM to 8/10  end range elbow , sup and wrist ext and RD   ? Time 3   ? Period Weeks   ? Status New   ? Target Date 02/07/22   ?  ? OT LONG TERM GOAL #2  ? Title Pt show pain less than 2/10 with R elbow, forearm and wrist AROM in all planes to use in more than 50% of  ADL's with more ease   ? Baseline pain 2-8/10 - but with use or AROM 7-8/10   ? Time 5   ? Period Weeks   ? Status New   ? Target Date 02/21/22   ?  ? OT LONG TERM GOAL #3  ? Title R UE AROM and strength increase to [facility] to use R arm in ADL's without increase symptoms   ? Baseline pain increase to 7-8/10 with AROM elbow , sup , wrist ext and RD -and gripping - NOW PULLING , pushing , gripping -per surgeon order  - 21/2 wks s/p   ? Time 8   ? Period Weeks   ? Status New   ? Target Date 03/14/22   ? ?  ?  ? ?  ? ? ? ? ? ? ? ? Plan - 03/07/22 0905   ? ? Clinical Impression Statement Pt refer to OT after having  R  radial N and ECRB release surgery on 12/29/21 - pt is 8  1/2 wks s/p. She made great progress in AROM for elbow flexion , ext and forearm supination  WNL. and able to do radial N glide without pain - had increase flexibility. Her pain now is at lateral epicondyle - she is coming in today with pain again 7/10 but have colder weather the last 2 weeks. Pain was before than 2/10 in one session but increase with certain movements still to   6-8/10 pain -Tightness improving over  extensors of forearm with extended elbow. Pt seen surgeon on 02/14/22 to cont  OT - Initiated Ionto again  the  last 4 sessions with dexamethazone -tolerate well - 5th session today. Pt is R hand dominant   Pt was ed on lateral epicondyle modification and ed on use of counter force strap - but not tight and to use when using R UE for cleaning or dog to decrease pain.  Pt report she could not find one will look again this weekend. but forgot it at home  COnt to decrease pain with increase ROM and strength -  to increase functional use of R dominant hand in ADL's and IADL's. Assess fit of  counter force strap .   ? OT Occupational Profile and History Problem Focused Assessment - Including review of records relating to presenting problem   ? Occupational performance deficits (Please refer to evaluation for details): ADL's;IADL's;Work;Play;Leisure;Social Participation   ? Body Structure / Function / Physical Skills ADL;Decreased knowledge of use of DME;Strength;Pain;UE functional use;ROM;IADL;Decreased knowledge of precautions;Flexibility   ? Rehab Potential Fair   ? Clinical Decision Making Limited treatment options, no task modification necessary   ? Comorbidities Affecting Occupational Performance: None   ? Modification or Assistance to Complete Evaluation  No modification of tasks or assist necessary to complete eval   ? OT Frequency 2x / week   ? OT Duration 6 weeks   ? OT Treatment/Interventions Self-care/ADL training;Ultrasound;Contrast Bath;Therapeutic exercise;Manual Therapy;Patient/family education;Passive range of motion;Therapeutic activities;Splinting;DME and/or AE instruction;Scar mobilization;Paraffin;Iontophoresis   ? Consulted and Agree with Plan of Care Patient   ? ?  ?  ? ?  ? ? ?Patient will benefit from skilled therapeutic intervention in order to improve the following deficits and impairments:   ?Body Structure / Function / Physical Skills: ADL, Decreased  knowledge of use of DME, Strength, Pain, UE functional use, ROM, IADL, Decreased knowledge of precautions, Flexibility ?  ?  ? ? ?Visit Diagnosis: ?Stiffness of right elbow, not elsewhere classified ? ?Pain in r

## 2022-03-11 ENCOUNTER — Other Ambulatory Visit: Payer: Self-pay

## 2022-03-11 ENCOUNTER — Ambulatory Visit: Payer: 59 | Admitting: Occupational Therapy

## 2022-03-11 DIAGNOSIS — M79601 Pain in right arm: Secondary | ICD-10-CM

## 2022-03-11 DIAGNOSIS — M25621 Stiffness of right elbow, not elsewhere classified: Secondary | ICD-10-CM

## 2022-03-11 DIAGNOSIS — M6281 Muscle weakness (generalized): Secondary | ICD-10-CM

## 2022-03-11 DIAGNOSIS — M25631 Stiffness of right wrist, not elsewhere classified: Secondary | ICD-10-CM | POA: Diagnosis not present

## 2022-03-11 NOTE — Therapy (Signed)
[place] ?[facility] ?[address] ?[place], [place], [address] ?Phone: [phone]   Fax:  [phone] ? ?Occupational Therapy Treatment ? ?Patient Details  ?Name: Rachel Hahn ?MRN: [id number] ?Date of Birth: January 01, 1956 ?Referring Provider (OT): DR Dierdre Searles ? ? ?Encounter Date: 03/11/2022 ? ? OT End of Session - 03/11/22 1044   ? ? Visit Number 13   ? Number of Visits 16   ? Date for OT Re-Evaluation 03/14/22   ? OT Start Time 1044   ? OT Stop Time 1131   ? OT Time Calculation (min) 47 min   ? Activity Tolerance Patient tolerated treatment well   ? Behavior During Therapy [facility] for tasks assessed/performed   ? ?  ?  ? ?  ? ? ?Past Medical History:  ?Diagnosis Date  ? Hypertension   ? Kidney stones   ? Mitral valve prolapse   ? ? ?Past Surgical History:  ?Procedure Laterality Date  ? KIDNEY CYST REMOVAL    ? LITHOTRIPSY    ? TONSILLECTOMY    ? ? ?There were no vitals filed for this visit. ? ? Subjective Assessment - 03/11/22 1044   ? ? Subjective  My elbow feels a little bit better I think is the warmer weather-I brought my strength, elbow I did remember it today   ? Pertinent History Right radial tunnel release. (Right)  Right open ECRB release. (Right)    Date of Surgery: December 29, 2021    Surgeon: Janalyn Rouse, MD     Subjective:     Rachel Hahn is a 66 y.o. female who presents today for wound check 01/10/22 . Patient has a surgical incision wound which is located on the right antecubital fossa. Current symptoms: no wound issues Pain is rated 8/10. 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 Patient Stated Goals I just want the pain and use better in my R arm so I can use it   ? Currently in Pain? Yes   ? Pain Score 4    ? Pain Location Elbow   ? Pain Orientation Right   ? Pain Descriptors / Indicators Tender;Aching   ? Pain Type Acute pain   ? Pain Onset More than a month ago    ? Pain Frequency Intermittent   ? ?  ?  ? ?  ? ? ? ? ? [facility] OT Assessment - 03/11/22 0001   ? ?  ? Strength  ? Right Hand Grip (lbs) 15   counter force strap  ? ?  ?  ? ?  ? ? ? ?  Patient brought counterforce strap in today ?Patient education was done to donn correctly as well as wearing with activities that bother the elbow ?Use counterforce strap while assessing patient able to push and pull heavy door with very little pain.  Could push up from a chair with very little pain. ?Still was mostly bothered by turning a doorknob. ?At was carrying 4 pounds at side of body comfortably ?Increased pain with attempted left but could do with less pain when elbow curl and palm up ?Reviewed with patient doing 2 pound elbow curls with palm up 3 times a day 10 reps lateral pain ?Grip on the right increased 5 pounds with counterforce strap on ?  ?  ?  Skin check done prior to ionto,  tolerated well  ?Patient to keep patch on for hour after done ?  ? ? ? ? ? ? ? OT Treatments/Exercises (OP) - 03/11/22 0001   ? ?  ? Iontophoresis  ? Type of Iontophoresis Dexamethasone   ? Location R lateral epicondyle   ? Dose med patch, 2.0 current   ? Time 19   ? ?  ?  ? ?  ? ? ? ?  Assist range of motion elbow within normal limits wrist flexibility increased greatly with very little pain with elbow to side and extended arm ? ? ? ? ? ? OT Education - 03/11/22 1044   ? ? Education Details progress and HEP-wearing and donning counterforce strap on the right elbow correctly   ? Person(s) Educated Patient   ? Methods Explanation;Demonstration;Tactile cues;Verbal cues;Handout   ? Comprehension Verbal cues required;Returned demonstration;Verbalized understanding   ? ?  ?  ? ?  ? ? ? ? ? ? OT Long Term Goals - 03/11/22 1120   ? ?  ? OT LONG TERM GOAL #1  ? Title Pt to be independent in HEP to decrease pain to less than 2/10 in wrist, forearm and elbow AROM   ? Baseline pain 2-4/10 at the best and resting in session 7/10 - increase with AROM to 8/10  end  range elbow , sup and wrist ext and RD  - OT now only lateral epicondyle - tenderness, with supination and lifting   ? Time 1   ? Period Weeks   ? Status On-going   ? Target Date 03/14/22   ?  ? OT LONG TERM GOAL #3  ? Title R UE AROM and strength increase to [facility] to use R arm in ADL's without increase symptoms   ? Baseline pain increase to 7-8/10 with AROM elbow , sup , wrist ext and RD -and gripping - No lifting , turning doorknob - pain at lateral epicondyle - not radial n decompression   ? Time 1   ? Period Weeks   ? Status On-going   ? Target Date 03/14/22   ? ?  ?  ? ?  ? ? ? ? ? ? ? ? Plan - 03/11/22 1115   ? ? Clinical Impression Statement Pt refer to OT after having  R radial N and ECRB release surgery on 12/29/21 - pt is 10 wks s/p. She made great progress in AROM for elbow flexion , ext and forearm supination  WNL. and able to do radial N glide without pain - had increase flexibility. Her pain now is at lateral epicondyle -patient pain at lateral epicondyle fluctuate between a 4 and a 7/10 with use.  Patient report lifting and turning a doorknob during the worse.  Pain this date low but better because of warmer weather Tightness improving over  extensors of forearm with extended elbow. Pt seen surgeon on 02/14/22 to cont  OT -patient this date with 6 session of ionto with dexamethazone consecutively-tolerate well -  Pt is R hand dominant   Pt was ed on lateral epicondyle modification and ed on use of counter force strap-done fitting of counterforce strap this date teaching patient to don correctly.  Used in clinic patient able to push and pull heavy door. carry 4 pounds with very little increase symptoms.  Patient still had increased pain with doorknob turning and lifting more than 4 pounds was able to comfortably do elbow curls with palm up 2 pounds.  At to home exercises for  patient, to use counterforce strap during the day with activities that bothers her. Decrease pt to 1 x wk . Cont to decrease pain  with increase ROM and strength -  to increase functional use of R dominant hand in ADL's and IADL's.   ? OT Occupational Profile and History Problem Focused Assessment - Including review of records relating to presenting problem   ? Occupational performance deficits (Please refer to evaluation for details): ADL's;IADL's;Work;Play;Leisure;Social Participation   ? Body Structure / Function / Physical Skills ADL;Decreased knowledge of use of DME;Strength;Pain;UE functional use;ROM;IADL;Decreased knowledge of precautions;Flexibility   ? Rehab Potential Fair   ? Clinical Decision Making Limited treatment options, no task modification necessary   ? Comorbidities Affecting Occupational Performance: None   ? Modification or Assistance to Complete Evaluation  No modification of tasks or assist necessary to complete eval   ? OT Frequency 1x / week   ? OT Duration --   1 wks  ? OT Treatment/Interventions Self-care/ADL training;Ultrasound;Contrast Bath;Therapeutic exercise;Manual Therapy;Patient/family education;Passive range of motion;Therapeutic activities;Splinting;DME and/or AE instruction;Scar mobilization;Paraffin;Iontophoresis   ? Consulted and Agree with Plan of Care Patient   ? ?  ?  ? ?  ? ? ?Patient will benefit from skilled therapeutic intervention in order to improve the following deficits and impairments:   ?Body Structure / Function / Physical Skills: ADL, Decreased knowledge of use of DME, Strength, Pain, UE functional use, ROM, IADL, Decreased knowledge of precautions, Flexibility ?  ?  ? ? ?Visit Diagnosis: ?Stiffness of right elbow, not elsewhere classified ? ?Pain in right arm ? ?Muscle weakness (generalized) ? ? ? ?Problem List ?Patient Active Problem List  ? Diagnosis Date Noted  ? Lateral antebrachial cutaneous nerve injury, sequela (Right) 10/06/2021  ? Right forearm pain 10/06/2021  ? Nerve entrapment syndrome of arm (Right) 10/06/2021  ? Chronic pain syndrome 10/05/2021  ? Pharmacologic therapy  10/05/2021  ? Disorder of skeletal system 10/05/2021  ? Problems influencing health status 10/05/2021  ? Encounter for chronic pain management 10/05/2021  ? Chronic upper extremity pain (1ry area of Pain) (Rig

## 2022-03-15 ENCOUNTER — Ambulatory Visit: Payer: 59 | Admitting: Occupational Therapy

## 2022-03-15 ENCOUNTER — Encounter: Payer: 59 | Admitting: Occupational Therapy

## 2022-03-15 ENCOUNTER — Other Ambulatory Visit: Payer: Self-pay

## 2022-03-15 DIAGNOSIS — M25621 Stiffness of right elbow, not elsewhere classified: Secondary | ICD-10-CM

## 2022-03-15 DIAGNOSIS — M79601 Pain in right arm: Secondary | ICD-10-CM

## 2022-03-15 DIAGNOSIS — M25631 Stiffness of right wrist, not elsewhere classified: Secondary | ICD-10-CM | POA: Diagnosis not present

## 2022-03-15 NOTE — Therapy (Signed)
[place] ?[facility] ?[address] ?[place], [place], [address] ?Phone: [phone]   Fax:  [phone] ? ?Occupational Therapy Treatment ? ?Patient Details  ?Name: Rachel Hahn ?MRN: [id number] ?Date of Birth: [birth year] ?Referring Provider (OT): DR Dierdre Searles ? ? ?Encounter Date: 03/15/2022 ? ? OT End of Session - 03/15/22 1132   ? ? Visit Number 14   ? Number of Visits 18   ? Date for OT Re-Evaluation 04/12/22   ? OT Start Time 1126   ? OT Stop Time 1202   ? OT Time Calculation (min) 36 min   ? Activity Tolerance Patient tolerated treatment well   ? Behavior During Therapy [facility] for tasks assessed/performed   ? ?  ?  ? ?  ? ? ?Past Medical History:  ?Diagnosis Date  ? Hypertension   ? Kidney stones   ? Mitral valve prolapse   ? ? ?Past Surgical History:  ?Procedure Laterality Date  ? KIDNEY CYST REMOVAL    ? LITHOTRIPSY    ? TONSILLECTOMY    ? ? ?There were no vitals filed for this visit. ? ? Subjective Assessment - 03/15/22 1130   ? ? Subjective  I tried to wear the elbow strap but it irritated my arm - made it red - today feels a burning , sore over the elbow   ? Pertinent History Right radial tunnel release. (Right)  Right open ECRB release. (Right)    Date of Surgery: December 29, 2021    Surgeon: Janalyn Rouse, MD     Subjective:     Rachel Hahn is a 66 y.o. female who presents today for wound check 01/10/22 . Patient has a surgical incision wound which is located on the right antecubital fossa. Current symptoms: no wound issues Pain is rated 8/10. 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 Currently in Pain? Yes   ? Pain Score 4    ? Pain Location Elbow   ? Pain Orientation Right   ? Pain Descriptors / Indicators Aching;Tender   ? Pain Type Acute pain;Chronic pain   ? Pain Onset More than a month ago   ? ?  ?  ? ?  ? ? ? ? ? ? ? ? ? ? ? ?Patient brought counterforce strap in  last week ?Patient education was done to donn correctly as well as wearing with activities that bother the elbow ?Use counterforce strap while assessing patient able to push and pull heavy door with very little pain.  Could push up from a chair with very little pain. ?Still was mostly bothered by turning a doorknob. ?At was carrying 4 pounds at side of body comfortably ?Increased pain with attempted left but could do with less pain when elbow curl and palm up ?Reviewed with patient doing 2 pound elbow curls with palm up 3 times a day 10 reps lateral pain ?Grip on the right increased 5 pounds with counterforce strap on ? But this date pt report counterforce strap irritated her forearm after wearing it last time patient to hold off ?Reviewed with patient again range of motion stretches after contrast pain decrease increase mobility with less discomfort patient tried to continue doing 2 pound elbow curls with palm up to maintain her strength until appointment with surgeon ?  ?  ?  Skin check done prior to  ionto, tolerated well  ?Patient to keep patch on for hour after done ? ? ? OT Treatments/Exercises (OP) - 03/15/22 0001   ? ?  ? Moist Heat Therapy  ? Number Minutes Moist Heat 6 Minutes   ? Moist Heat Location Elbow;Wrist   ?  ? Iontophoresis  ? Type of Iontophoresis Dexamethasone   ? Location R lateral epicondyle   ? Dose med patch, 2.0 current   ? Time 19   ? ?  ?  ? ?  ? ? ? ? ? ? ? ? ? OT Education - 03/15/22 1132   ? ? Education Details progress and HEP-   ? Person(s) Educated Patient   ? Methods Explanation;Demonstration;Tactile cues;Verbal cues;Handout   ? Comprehension Verbal cues required;Returned demonstration;Verbalized understanding   ? ?  ?  ? ?  ? ? ? ? ? ? OT Long Term Goals - 03/15/22 1259   ? ?  ? OT LONG TERM GOAL #1  ? Title Pt to be independent in HEP to decrease pain to less than 2/10 in wrist, forearm and elbow AROM   ? Baseline pain 2-4/10 at the best and resting in session 7/10 - increase with  AROM to 8/10  end range elbow , sup and wrist ext and RD  - OT now only lateral epicondyle - tenderness, with supination and lifting   ? Time 4   ? Period Weeks   ? Status On-going   ? Target Date 04/12/22   ?  ? OT LONG TERM GOAL #2  ? Title Pt show pain less than 2/10 with R elbow, forearm and wrist AROM in all planes to use in more than 50% of  ADL's with more ease   ? Baseline pain now over lateral epicondyle - not at elbow medial or forearm /wrist as prior to surgery   ? Time 4   ? Period Weeks   ? Status On-going   ? Target Date 04/12/22   ?  ? OT LONG TERM GOAL #3  ? Title R UE AROM and strength increase to [facility] to use R arm in ADL's without increase symptoms   ? Baseline pain increase to 7-8/10 with AROM elbow , sup , wrist ext and RD -and gripping - No lifting , turning doorknob - pt using hand and strenght incease but pain over lateral epicondyle -   ? Time 4   ? Period Weeks   ? Status On-going   ? Target Date 04/12/22   ? ?  ?  ? ?  ? ? ? ? ? ? ? ? Plan - 03/15/22 1133   ? ? Clinical Impression Statement Pt refer to OT after having  R radial N and ECRB release surgery on 12/29/21 - pt is 10 1/2  wks s/p. She made great progress in AROM for elbow flexion , ext and forearm supination  WNL. and able to do radial N glide without pain - had increase flexibility. Her pain now is at lateral epicondyle -patient pain at lateral epicondyle fluctuate between a 4 and a 7/10 with use.  Patient report lifting and turning a doorknob during the worse.  Pain this date low but better because of warmer weather Tightness improving over  extensors of forearm with extended elbow. Pt seen surgeon on 02/14/22 to cont  OT -patient this date with 7 session of ionto with dexamethazone consecutively-tolerate well -  Pt is R hand dominant   Pt was ed on lateral epicondyle modification  and ed on use of counter force strap last time -done fitting of counterforce strap  last week but pt report it irritated her skin since then. Pt to hold  off and cont to modify act. .  Patient still had increased pain with doorknob turning and lifting more than 4 pounds was able to comfortably do elbow curls with palm up 2 pounds.  Pt to cont with HEP in maintain her strength until appt for follow up with surgeon.  Decrease pt to 1 x wk to biweekly . Cont to decrease pain with increase ROM and strength -  to increase functional use of R dominant hand in ADL's and IADL's.   ? OT Occupational Profile and History Problem Focused Assessment - Including review of records relating to presenting problem   ? Occupational performance deficits (Please refer to evaluation for details): ADL's;IADL's;Work;Play;Leisure;Social Participation   ? Body Structure / Function / Physical Skills ADL;Decreased knowledge of use of DME;Strength;Pain;UE functional use;ROM;IADL;Decreased knowledge of precautions;Flexibility   ? Rehab Potential Fair   ? Clinical Decision Making Limited treatment options, no task modification necessary   ? Comorbidities Affecting Occupational Performance: None   ? Modification or Assistance to Complete Evaluation  No modification of tasks or assist necessary to complete eval   ? OT Frequency 1x / week   ? OT Duration --   1  ? OT Treatment/Interventions Self-care/ADL training;Ultrasound;Contrast Bath;Therapeutic exercise;Manual Therapy;Patient/family education;Passive range of motion;Therapeutic activities;Splinting;DME and/or AE instruction;Scar mobilization;Paraffin;Iontophoresis   ? Consulted and Agree with Plan of Care Patient   ? ?  ?  ? ?  ? ? ?Patient will benefit from skilled therapeutic intervention in order to improve the following deficits and impairments:   ?Body Structure / Function / Physical Skills: ADL, Decreased knowledge of use of DME, Strength, Pain, UE functional use, ROM, IADL, Decreased knowledge of precautions, Flexibility ?  ?  ? ? ?Visit Diagnosis: ?Stiffness of right elbow, not elsewhere classified - Plan: Ot plan of care  cert/re-cert ? ?Pain in right arm - Plan: Ot plan of care cert/re-cert ? ? ? ?Problem List ?Patient Active Problem List  ? Diagnosis Date Noted  ? 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

## 2022-03-17 ENCOUNTER — Encounter: Payer: 59 | Admitting: Occupational Therapy

## 2022-03-25 ENCOUNTER — Ambulatory Visit: Payer: 59 | Attending: Student | Admitting: Occupational Therapy

## 2022-04-19 DIAGNOSIS — R7302 Impaired glucose tolerance (oral): Secondary | ICD-10-CM | POA: Diagnosis not present

## 2022-04-19 DIAGNOSIS — E668 Other obesity: Secondary | ICD-10-CM | POA: Diagnosis not present

## 2022-04-19 DIAGNOSIS — E782 Mixed hyperlipidemia: Secondary | ICD-10-CM | POA: Diagnosis not present

## 2022-04-19 DIAGNOSIS — M5441 Lumbago with sciatica, right side: Secondary | ICD-10-CM | POA: Diagnosis not present

## 2022-04-19 DIAGNOSIS — M5442 Lumbago with sciatica, left side: Secondary | ICD-10-CM | POA: Diagnosis not present

## 2022-04-19 DIAGNOSIS — I10 Essential (primary) hypertension: Secondary | ICD-10-CM | POA: Diagnosis not present

## 2022-04-19 DIAGNOSIS — E559 Vitamin D deficiency, unspecified: Secondary | ICD-10-CM | POA: Diagnosis not present

## 2022-04-29 DIAGNOSIS — I10 Essential (primary) hypertension: Secondary | ICD-10-CM | POA: Diagnosis not present

## 2022-04-29 DIAGNOSIS — E782 Mixed hyperlipidemia: Secondary | ICD-10-CM | POA: Diagnosis not present

## 2022-04-29 DIAGNOSIS — M5441 Lumbago with sciatica, right side: Secondary | ICD-10-CM | POA: Diagnosis not present

## 2022-04-29 DIAGNOSIS — J449 Chronic obstructive pulmonary disease, unspecified: Secondary | ICD-10-CM | POA: Diagnosis not present

## 2022-05-02 DIAGNOSIS — M7711 Lateral epicondylitis, right elbow: Secondary | ICD-10-CM | POA: Diagnosis not present

## 2022-05-02 DIAGNOSIS — M25421 Effusion, right elbow: Secondary | ICD-10-CM | POA: Diagnosis not present

## 2022-05-02 DIAGNOSIS — M19029 Primary osteoarthritis, unspecified elbow: Secondary | ICD-10-CM | POA: Diagnosis not present

## 2022-05-02 DIAGNOSIS — G5631 Lesion of radial nerve, right upper limb: Secondary | ICD-10-CM | POA: Diagnosis not present

## 2022-05-19 DIAGNOSIS — I708 Atherosclerosis of other arteries: Secondary | ICD-10-CM

## 2022-05-19 DIAGNOSIS — I513 Intracardiac thrombosis, not elsewhere classified: Secondary | ICD-10-CM

## 2022-05-19 HISTORY — DX: Intracardiac thrombosis, not elsewhere classified: I51.3

## 2022-05-19 HISTORY — DX: Atherosclerosis of other arteries: I70.8

## 2022-05-20 DIAGNOSIS — D72828 Other elevated white blood cell count: Secondary | ICD-10-CM | POA: Diagnosis not present

## 2022-05-20 DIAGNOSIS — M47896 Other spondylosis, lumbar region: Secondary | ICD-10-CM | POA: Diagnosis not present

## 2022-05-20 DIAGNOSIS — E782 Mixed hyperlipidemia: Secondary | ICD-10-CM | POA: Diagnosis not present

## 2022-05-20 DIAGNOSIS — I10 Essential (primary) hypertension: Secondary | ICD-10-CM | POA: Diagnosis not present

## 2022-05-20 DIAGNOSIS — J449 Chronic obstructive pulmonary disease, unspecified: Secondary | ICD-10-CM | POA: Diagnosis not present

## 2022-05-30 DIAGNOSIS — R600 Localized edema: Secondary | ICD-10-CM | POA: Diagnosis not present

## 2022-05-30 DIAGNOSIS — M79674 Pain in right toe(s): Secondary | ICD-10-CM | POA: Diagnosis not present

## 2022-05-31 ENCOUNTER — Other Ambulatory Visit: Payer: Self-pay | Admitting: Physical Medicine & Rehabilitation

## 2022-05-31 DIAGNOSIS — M5442 Lumbago with sciatica, left side: Secondary | ICD-10-CM | POA: Diagnosis not present

## 2022-05-31 DIAGNOSIS — G8929 Other chronic pain: Secondary | ICD-10-CM

## 2022-05-31 DIAGNOSIS — M5441 Lumbago with sciatica, right side: Secondary | ICD-10-CM | POA: Diagnosis not present

## 2022-06-01 ENCOUNTER — Telehealth (INDEPENDENT_AMBULATORY_CARE_PROVIDER_SITE_OTHER): Payer: Self-pay | Admitting: Nurse Practitioner

## 2022-06-01 NOTE — Telephone Encounter (Signed)
6.14.23 - LVM for pt. To return call to schedule appointment.

## 2022-06-03 ENCOUNTER — Encounter: Payer: Self-pay | Admitting: Intensive Care

## 2022-06-03 ENCOUNTER — Other Ambulatory Visit: Payer: Self-pay

## 2022-06-03 ENCOUNTER — Inpatient Hospital Stay
Admission: EM | Admit: 2022-06-03 | Discharge: 2022-06-09 | DRG: 269 | Disposition: A | Discharge status: Home / Self Care | Payer: 59 | Attending: Osteopathic Medicine | Admitting: Osteopathic Medicine

## 2022-06-03 ENCOUNTER — Emergency Department: Payer: 59

## 2022-06-03 DIAGNOSIS — I7143 Infrarenal abdominal aortic aneurysm, without rupture: Secondary | ICD-10-CM | POA: Diagnosis present

## 2022-06-03 DIAGNOSIS — F17210 Nicotine dependence, cigarettes, uncomplicated: Secondary | ICD-10-CM | POA: Diagnosis not present

## 2022-06-03 DIAGNOSIS — K429 Umbilical hernia without obstruction or gangrene: Secondary | ICD-10-CM | POA: Diagnosis not present

## 2022-06-03 DIAGNOSIS — E669 Obesity, unspecified: Secondary | ICD-10-CM | POA: Diagnosis not present

## 2022-06-03 DIAGNOSIS — I714 Abdominal aortic aneurysm, without rupture, unspecified: Secondary | ICD-10-CM | POA: Diagnosis not present

## 2022-06-03 DIAGNOSIS — F32A Depression, unspecified: Secondary | ICD-10-CM | POA: Diagnosis not present

## 2022-06-03 DIAGNOSIS — N132 Hydronephrosis with renal and ureteral calculous obstruction: Secondary | ICD-10-CM | POA: Diagnosis present

## 2022-06-03 DIAGNOSIS — K219 Gastro-esophageal reflux disease without esophagitis: Secondary | ICD-10-CM

## 2022-06-03 DIAGNOSIS — I779 Disorder of arteries and arterioles, unspecified: Secondary | ICD-10-CM | POA: Diagnosis not present

## 2022-06-03 DIAGNOSIS — I70261 Atherosclerosis of native arteries of extremities with gangrene, right leg: Secondary | ICD-10-CM | POA: Diagnosis present

## 2022-06-03 DIAGNOSIS — Z8249 Family history of ischemic heart disease and other diseases of the circulatory system: Secondary | ICD-10-CM

## 2022-06-03 DIAGNOSIS — Z87442 Personal history of urinary calculi: Secondary | ICD-10-CM

## 2022-06-03 DIAGNOSIS — N189 Chronic kidney disease, unspecified: Secondary | ICD-10-CM | POA: Diagnosis not present

## 2022-06-03 DIAGNOSIS — I70222 Atherosclerosis of native arteries of extremities with rest pain, left leg: Secondary | ICD-10-CM | POA: Diagnosis not present

## 2022-06-03 DIAGNOSIS — I7090 Unspecified atherosclerosis: Secondary | ICD-10-CM

## 2022-06-03 DIAGNOSIS — I10 Essential (primary) hypertension: Secondary | ICD-10-CM

## 2022-06-03 DIAGNOSIS — G894 Chronic pain syndrome: Secondary | ICD-10-CM | POA: Diagnosis not present

## 2022-06-03 DIAGNOSIS — I7409 Other arterial embolism and thrombosis of abdominal aorta: Principal | ICD-10-CM | POA: Diagnosis present

## 2022-06-03 DIAGNOSIS — I739 Peripheral vascular disease, unspecified: Secondary | ICD-10-CM

## 2022-06-03 DIAGNOSIS — Z7951 Long term (current) use of inhaled steroids: Secondary | ICD-10-CM

## 2022-06-03 DIAGNOSIS — F419 Anxiety disorder, unspecified: Secondary | ICD-10-CM | POA: Diagnosis present

## 2022-06-03 DIAGNOSIS — I7410 Embolism and thrombosis of unspecified parts of aorta: Secondary | ICD-10-CM | POA: Diagnosis not present

## 2022-06-03 DIAGNOSIS — I129 Hypertensive chronic kidney disease with stage 1 through stage 4 chronic kidney disease, or unspecified chronic kidney disease: Secondary | ICD-10-CM | POA: Diagnosis not present

## 2022-06-03 DIAGNOSIS — Z6832 Body mass index (BMI) 32.0-32.9, adult: Secondary | ICD-10-CM

## 2022-06-03 DIAGNOSIS — I513 Intracardiac thrombosis, not elsewhere classified: Secondary | ICD-10-CM | POA: Diagnosis not present

## 2022-06-03 DIAGNOSIS — I745 Embolism and thrombosis of iliac artery: Secondary | ICD-10-CM | POA: Diagnosis present

## 2022-06-03 DIAGNOSIS — Z79899 Other long term (current) drug therapy: Secondary | ICD-10-CM

## 2022-06-03 DIAGNOSIS — N281 Cyst of kidney, acquired: Secondary | ICD-10-CM | POA: Diagnosis not present

## 2022-06-03 DIAGNOSIS — F17200 Nicotine dependence, unspecified, uncomplicated: Secondary | ICD-10-CM | POA: Diagnosis present

## 2022-06-03 DIAGNOSIS — N261 Atrophy of kidney (terminal): Secondary | ICD-10-CM | POA: Diagnosis not present

## 2022-06-03 DIAGNOSIS — Z01810 Encounter for preprocedural cardiovascular examination: Secondary | ICD-10-CM | POA: Diagnosis not present

## 2022-06-03 DIAGNOSIS — I2510 Atherosclerotic heart disease of native coronary artery without angina pectoris: Secondary | ICD-10-CM | POA: Diagnosis not present

## 2022-06-03 DIAGNOSIS — R69 Illness, unspecified: Secondary | ICD-10-CM | POA: Diagnosis not present

## 2022-06-03 HISTORY — DX: Depression, unspecified: F32.A

## 2022-06-03 HISTORY — DX: Anxiety disorder, unspecified: F41.9

## 2022-06-03 LAB — COMPREHENSIVE METABOLIC PANEL
ALT: 17 U/L (ref 0–44)
AST: 17 U/L (ref 15–41)
Albumin: 3.9 g/dL (ref 3.5–5.0)
Alkaline Phosphatase: 85 U/L (ref 38–126)
Anion gap: 7 (ref 5–15)
BUN: 23 mg/dL (ref 8–23)
CO2: 26 mmol/L (ref 22–32)
Calcium: 9.5 mg/dL (ref 8.9–10.3)
Chloride: 106 mmol/L (ref 98–111)
Creatinine, Ser: 1.02 mg/dL — ABNORMAL HIGH (ref 0.44–1.00)
GFR, Estimated: >60 mL/min (ref >60)
Glucose, Bld: 114 mg/dL — ABNORMAL HIGH (ref 70–99)
Potassium: 4.4 mmol/L (ref 3.5–5.1)
Sodium: 139 mmol/L (ref 135–145)
Total Bilirubin: 0.5 mg/dL (ref 0.3–1.2)
Total Protein: 7.2 g/dL (ref 6.5–8.1)

## 2022-06-03 LAB — CBC WITH DIFFERENTIAL/PLATELET
Abs Immature Granulocytes: 0.03 10*3/uL (ref 0.00–0.07)
Basophils Absolute: 0 10*3/uL (ref 0.0–0.1)
Basophils Relative: 0 %
Eosinophils Absolute: 0.2 10*3/uL (ref 0.0–0.5)
Eosinophils Relative: 2 %
HCT: 45.2 % (ref 36.0–46.0)
Hemoglobin: 14.1 g/dL (ref 12.0–15.0)
Immature Granulocytes: 0 %
Lymphocytes Relative: 26 %
Lymphs Abs: 2.7 10*3/uL (ref 0.7–4.0)
MCH: 29.5 pg (ref 26.0–34.0)
MCHC: 31.2 g/dL (ref 30.0–36.0)
MCV: 94.6 fL (ref 80.0–100.0)
Monocytes Absolute: 0.6 10*3/uL (ref 0.1–1.0)
Monocytes Relative: 6 %
Neutro Abs: 6.7 10*3/uL (ref 1.7–7.7)
Neutrophils Relative %: 66 %
Platelets: 214 10*3/uL (ref 150–400)
RBC: 4.78 MIL/uL (ref 3.87–5.11)
RDW: 14.4 % (ref 11.5–15.5)
WBC: 10.2 10*3/uL (ref 4.0–10.5)
nRBC: 0 % (ref 0.0–0.2)

## 2022-06-03 LAB — HEPARIN LEVEL (UNFRACTIONATED): Heparin Unfractionated: 0.84 IU/mL — ABNORMAL HIGH (ref 0.30–0.70)

## 2022-06-03 LAB — APTT: aPTT: 26 seconds (ref 24–36)

## 2022-06-03 LAB — PROTIME-INR
INR: 0.9 (ref 0.8–1.2)
Prothrombin Time: 12.3 seconds (ref 11.4–15.2)

## 2022-06-03 IMAGING — CT CT ANGIO AOBIFEM WO/W CM
2 of 10 series · 11 of 46 positions shown, 14 images · IV contrast (APPLIED)
Comparison: CTA chest, abdomen and pelvis [DATE]

CLINICAL DATA: Discoloration in the feet.

EXAM:
CT ANGIOGRAPHY OF ABDOMINAL AORTA WITH ILIOFEMORAL RUNOFF
TECHNIQUE: Multidetector CT imaging of the abdomen, pelvis and lower
extremities was performed using the standard protocol during bolus
administration of intravenous contrast. Multiplanar CT image
reconstructions and MIPs were obtained to evaluate the vascular
anatomy.

[Series 4: axial arterial upper · axial · arterial · 0.68mm/px · z∈[+786,+1374]mm · 10 of 228 slices shown, 13 images]
[im 16/228  soft-tissue]
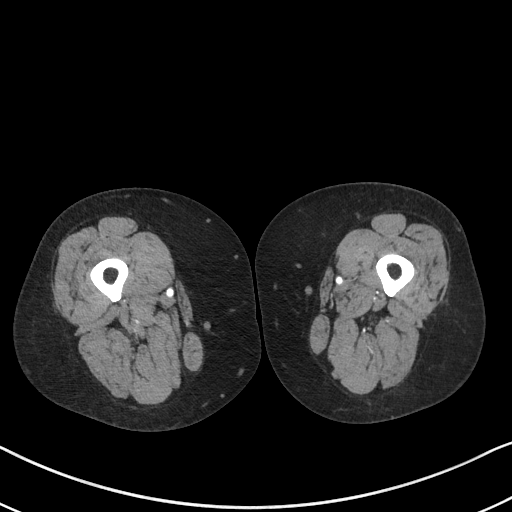
[im 16/228  bone]
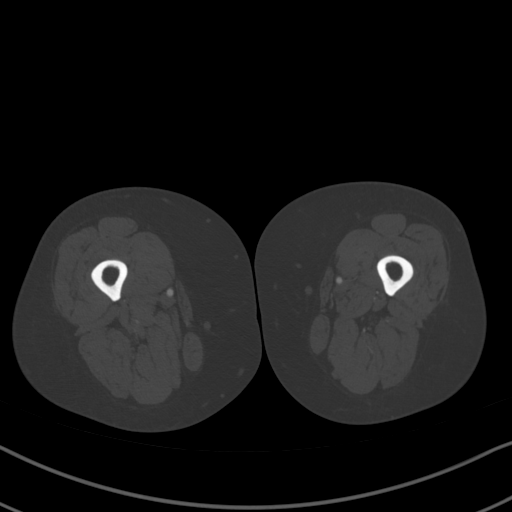
[im 46/228  soft-tissue]
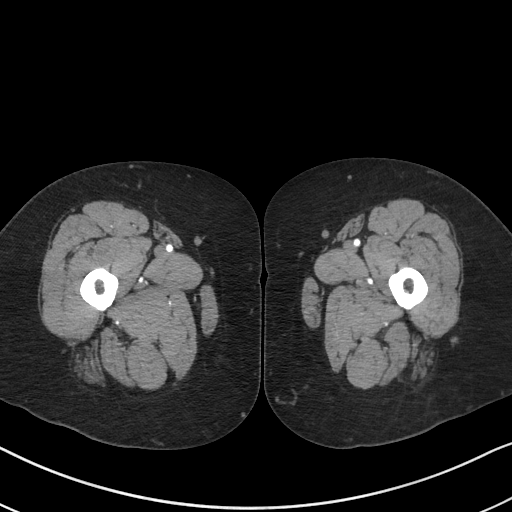
[im 76/228  soft-tissue]
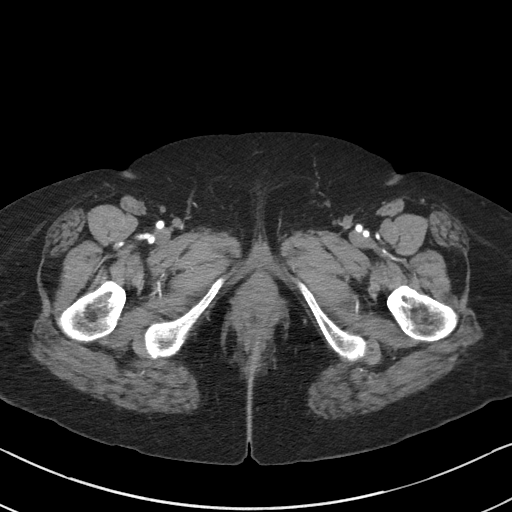
[im 106/228  soft-tissue]
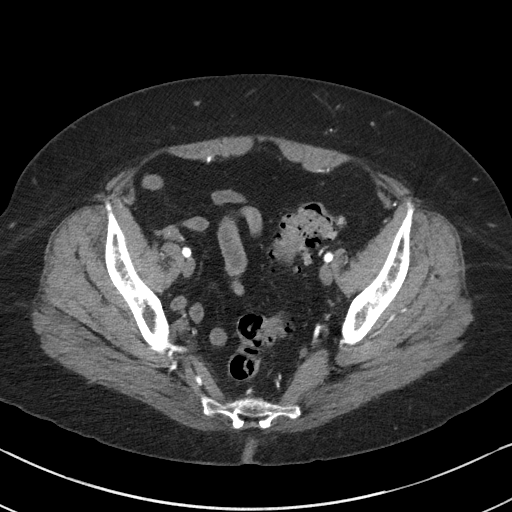
[im 122/228  soft-tissue]
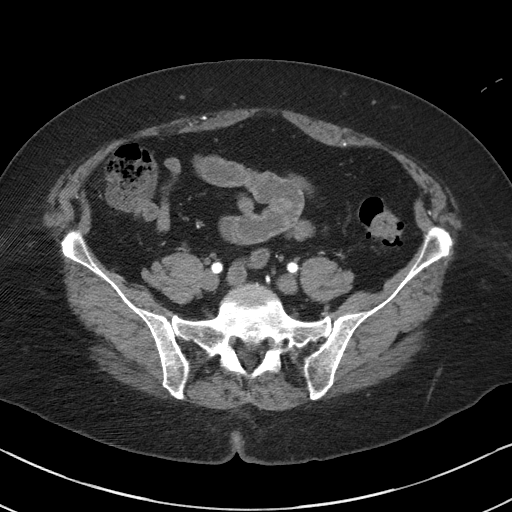
[im 152/228  soft-tissue]
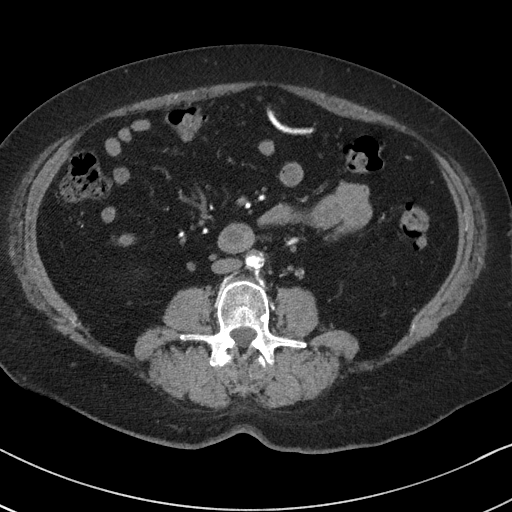
[im 167/228  lung]
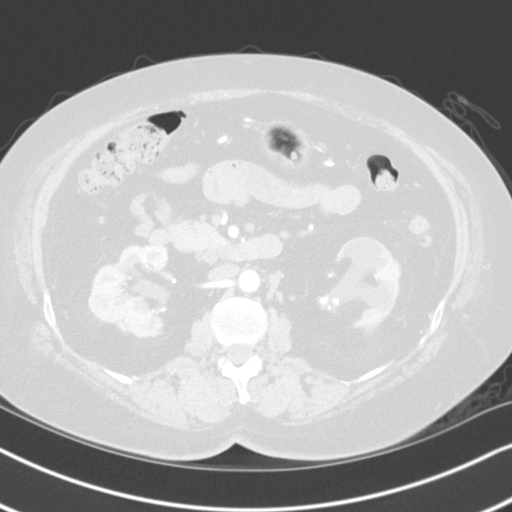
[im 182/228  soft-tissue]
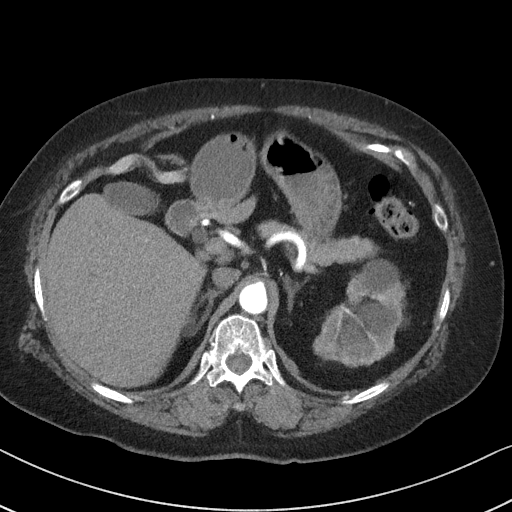
[im 182/228  lung]
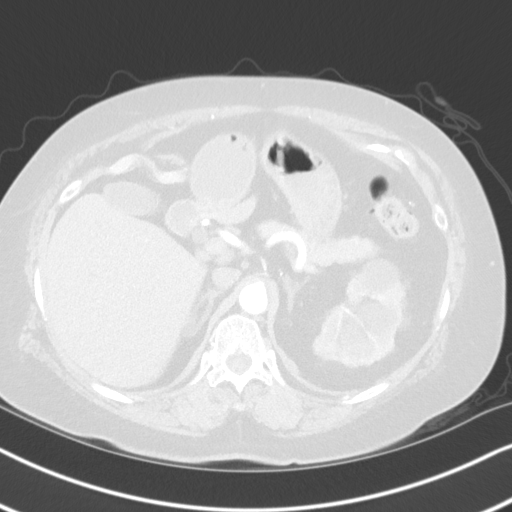
[im 197/228  lung]
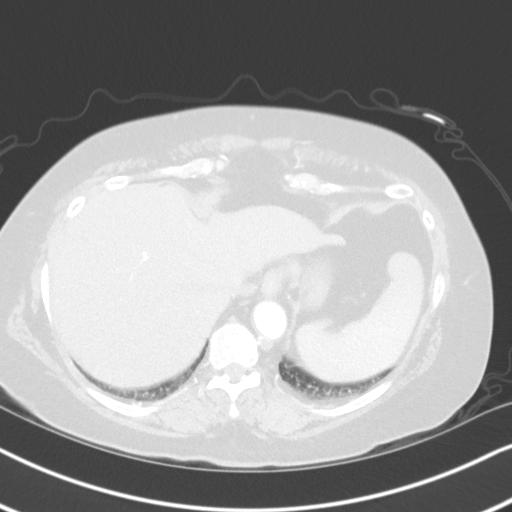
[im 212/228  soft-tissue]
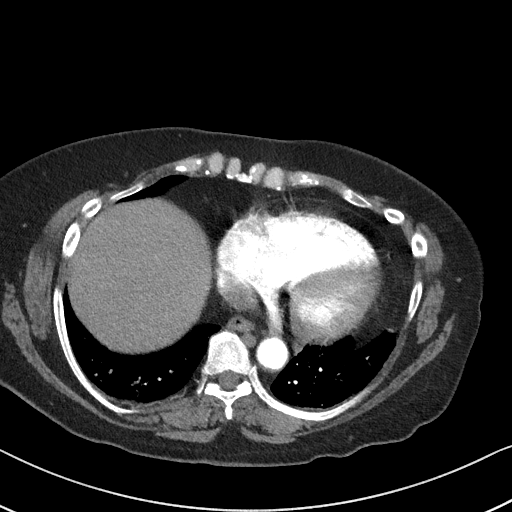
[im 212/228  lung]
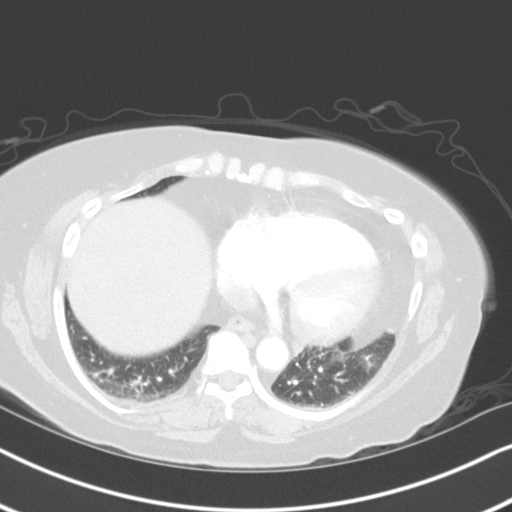

[Series 6: coronal upper · coronal · 0.78mm/px · 1 of 145 slices shown]
[im 73/145  soft-tissue]
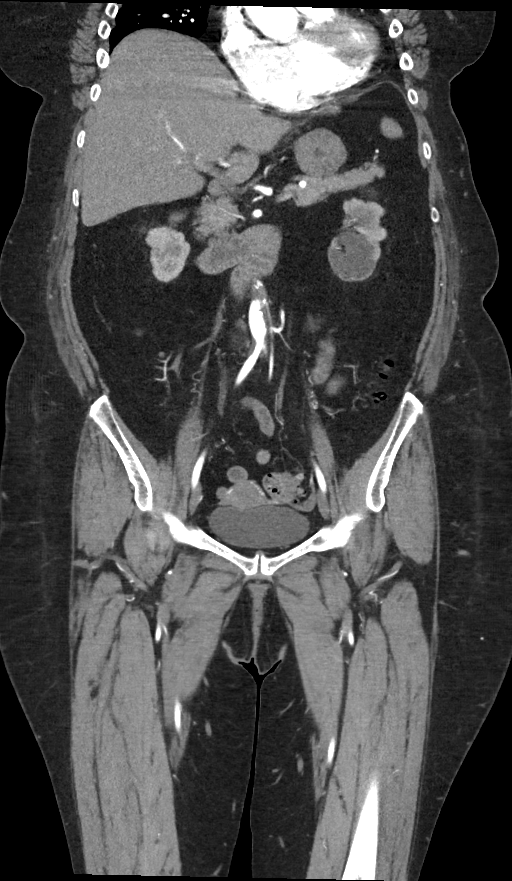

[11 of 46 positions shown; findings below may reference images not displayed]

RADIATION DOSE REDUCTION: This exam was performed according to the
departmental dose-optimization program which includes automated
exposure control, adjustment of the mA and/or kV according to
patient size and/or use of iterative reconstruction technique.

CONTRAST:  125mL OMNIPAQUE IOHEXOL 350 MG/ML SOLN
FINDINGS: VASCULAR

Aorta: Small amount of atherosclerotic disease involving the distal
descending thoracic aorta with normal caliber. The proximal
abdominal aorta is widely patent with minimal atherosclerotic
disease and no aneurysm. There is a large amount mural thrombus
involving the infrarenal abdominal aorta with near occlusion on
sequence 4, image 75. The amount of mural thrombus and degree of
stenosis in the infrarenal abdominal aorta has markedly increased
since [OX]. Negative for an aortic aneurysm or dissection.

Celiac: Patent without evidence of aneurysm, dissection, vasculitis
or significant stenosis.

SMA: Patent without evidence of aneurysm, dissection, vasculitis or
significant stenosis.

Renals: Both renal arteries are patent without evidence of aneurysm,
dissection, vasculitis, fibromuscular dysplasia or significant
stenosis.

IMA: Minimal flow near the origin probably related to the aortic
plaque. There is flow in the IMA and suspect that large portion of
the flow is supplied from SMA collateral flow.

RIGHT Lower Extremity

Inflow: Mild narrowing near the origin related to noncalcified
plaque. Otherwise, the right common iliac artery is patent. Right
internal and right external iliac arteries are widely patent.

Outflow: Common, superficial and profunda femoral arteries and the
popliteal artery are patent without evidence of aneurysm,
dissection, vasculitis or significant stenosis.

Runoff: Proximal runoff vessels are patent and the peroneal artery
is patent to the ankle. Posterior tibial artery occludes in the mid
calf and there is some distal reconstitution at the ankle with flow
in the plantar branches. Anterior tibial artery occludes in the
mid/distal calf. No significant flow in the dorsalis pedis artery.

LEFT Lower Extremity

Inflow: Common, internal and external iliac arteries are patent
without evidence of aneurysm, dissection, vasculitis or significant
stenosis.

Outflow: Common, superficial and profunda femoral arteries and the
popliteal artery are patent without evidence of aneurysm,
dissection, vasculitis or significant stenosis.

Runoff: No significant flow in the posterior tibial artery. Peroneal
artery is patent down to the ankle and there is some reconstitution
of the distal posterior tibial artery and plantar arteries. The
anterior tibial artery occludes in the distal calf and there is no
significant flow in the dorsalis pedis artery.

Veins: No obvious venous abnormality within the limitations of this
arterial phase study.

Review of the MIP images confirms the above findings.

NON-VASCULAR

Lower chest: Volume loss at both lung bases. No large pleural
effusions.

Hepatobiliary: Again noted is a small low-density structure in the
anterior liver that measures roughly 5 mm and could represent a
cyst. Otherwise, stable appearance of the liver without acute
findings. No acute abnormality to the gallbladder. No significant
biliary dilatation.

Pancreas: Unremarkable. No pancreatic ductal dilatation or
surrounding inflammatory changes.

Spleen: Normal in size without focal abnormality.

Adrenals/Urinary Tract: 1.6 cm left adrenal nodule with Hounsfield
units measuring -13 and this is most compatible with a left adrenal
adenoma. Normal appearance of the right adrenal gland. Bilateral
renal atrophy with probable bilateral cortical scarring. Again noted
are multiple right renal calculi and largest measures 6 mm in the
right kidney lower pole. There is no significant right
hydronephrosis. No evidence for right ureter stones. There is at
least 1 cortical cyst in the right kidney. Again noted are
parapelvic and cortical left renal cysts. Mild-moderate dilatation
of left renal calices with at least 3 stones in left renal pelvis
and the largest measures 6 mm. Multiple left renal calculi. No
significant dilatation of the left ureter. No perinephric edema or
stranding. Normal appearance of the urinary bladder.

Stomach/Bowel: Scattered colonic diverticula without acute bowel
inflammation. Normal appearance of the stomach. No evidence for
bowel dilatation or obstruction. Normal appendix.

Lymphatic: No abdominal or pelvic lymph node enlargement. Index left
periaortic lymph node measures 8 mm in the short axis on sequence 4
image 65.

Reproductive: Uterus and bilateral adnexa are unremarkable.

Other: Small inguinal hernias containing fat. Tiny umbilical hernia
containing fat. Negative for free fluid. Negative for free air.

Musculoskeletal: No acute bone abnormality. Subcutaneous edema in
the calves.
IMPRESSION: VASCULAR

1. Large amount of mural thrombus/plaque in the infrarenal abdominal
aorta with focal severe stenosis. The amount of mural thrombus in
the abdominal aorta has markedly increased since [OX]. This aortic
mural thrombus is likely the source for embolic disease.
2. Bilateral runoff disease. Occlusions in the posterior tibial
artery and anterior tibial artery bilaterally are likely related to
embolic disease. Primary runoff vessel is the peroneal artery
bilaterally. Reconstitution of the distal posterior tibial arteries
bilaterally.
3. No significant outflow disease.
4. Minimal flow at the origin of the IMA related to the aortic
disease but there is distal reconstitution. The other main visceral
arteries are widely patent.

NON-VASCULAR

1. Bilateral renal calculi with atrophy and cortical scarring in
both kidneys. Mild-to-moderate left hydronephrosis due to stones at
the left renal pelvis. Left hydronephrosis has slightly increased
since [OX]. Bilateral renal cysts.
2. Left adrenal adenoma.
3. Small umbilical hernia and small bilateral inguinal hernias.

## 2022-06-03 MED ORDER — IOHEXOL 350 MG/ML SOLN
125.0000 mL | Freq: Once | INTRAVENOUS | Status: AC | PRN
  Administered 2022-06-03: 125 mL via INTRAVENOUS

## 2022-06-03 MED ORDER — HEPARIN (PORCINE) 25000 UT/250ML-% IV SOLN
1050.0000 [IU]/h | INTRAVENOUS | Status: DC
Start: 2022-06-03 — End: 2022-06-08
  Administered 2022-06-03: 1100 [IU]/h via INTRAVENOUS
  Administered 2022-06-04 – 2022-06-06 (×3): 950 [IU]/h via INTRAVENOUS
  Administered 2022-06-07: 1050 [IU]/h via INTRAVENOUS
  Filled 2022-06-03 (×5): qty 250

## 2022-06-03 MED ORDER — ACETAMINOPHEN 325 MG PO TABS
650.0000 mg | ORAL_TABLET | Freq: Four times a day (QID) | ORAL | Status: DC | PRN
  Administered 2022-06-05 – 2022-06-07 (×3): 650 mg via ORAL
  Filled 2022-06-03 (×3): qty 2

## 2022-06-03 MED ORDER — SENNOSIDES-DOCUSATE SODIUM 8.6-50 MG PO TABS: 1.0000 | ORAL_TABLET | Freq: Every evening | ORAL | Status: DC | PRN

## 2022-06-03 MED ORDER — OXYCODONE HCL 5 MG PO TABS: 5.0000 mg | ORAL_TABLET | Freq: Four times a day (QID) | ORAL | Status: AC | PRN

## 2022-06-03 MED ORDER — NICOTINE 21 MG/24HR TD PT24: 21.0000 mg | MEDICATED_PATCH | Freq: Every day | TRANSDERMAL | Status: DC | PRN

## 2022-06-03 MED ORDER — ONDANSETRON HCL 4 MG PO TABS
4.0000 mg | ORAL_TABLET | Freq: Four times a day (QID) | ORAL | Status: AC | PRN
Start: 2022-06-03 — End: 2022-06-08

## 2022-06-03 MED ORDER — SERTRALINE HCL 50 MG PO TABS
100.0000 mg | ORAL_TABLET | Freq: Every day | ORAL | Status: DC
  Administered 2022-06-05 – 2022-06-09 (×5): 100 mg via ORAL
  Filled 2022-06-03 (×5): qty 2

## 2022-06-03 MED ORDER — OXYCODONE HCL 5 MG PO TABS
5.0000 mg | ORAL_TABLET | Freq: Once | ORAL | Status: AC
  Administered 2022-06-03: 5 mg via ORAL
  Filled 2022-06-03: qty 1

## 2022-06-03 MED ORDER — MOMETASONE FURO-FORMOTEROL FUM 200-5 MCG/ACT IN AERO
2.0000 | INHALATION_SPRAY | Freq: Two times a day (BID) | RESPIRATORY_TRACT | Status: DC
  Administered 2022-06-03 – 2022-06-09 (×12): 2 via RESPIRATORY_TRACT
  Filled 2022-06-03: qty 8.8

## 2022-06-03 MED ORDER — LIDOCAINE 5 % EX PTCH
1.0000 | MEDICATED_PATCH | Freq: Every day | CUTANEOUS | Status: AC | PRN
Start: 2022-06-03 — End: 2022-06-08

## 2022-06-03 MED ORDER — PANTOPRAZOLE SODIUM 40 MG PO TBEC
40.0000 mg | DELAYED_RELEASE_TABLET | Freq: Every day | ORAL | Status: DC
  Administered 2022-06-05 – 2022-06-09 (×5): 40 mg via ORAL
  Filled 2022-06-03 (×5): qty 1

## 2022-06-03 MED ORDER — ONDANSETRON HCL 4 MG/2ML IJ SOLN
4.0000 mg | Freq: Four times a day (QID) | INTRAMUSCULAR | Status: DC | PRN
  Administered 2022-06-04: 4 mg via INTRAVENOUS
  Filled 2022-06-03: qty 2

## 2022-06-03 MED ORDER — ACETAMINOPHEN 650 MG RE SUPP: 650.0000 mg | Freq: Four times a day (QID) | RECTAL | Status: DC | PRN

## 2022-06-03 MED ORDER — MORPHINE SULFATE (PF) 2 MG/ML IV SOLN: 2.0000 mg | INTRAVENOUS | Status: DC | PRN

## 2022-06-03 MED ORDER — ATENOLOL 25 MG PO TABS
100.0000 mg | ORAL_TABLET | Freq: Every day | ORAL | Status: DC
  Administered 2022-06-05 – 2022-06-09 (×5): 100 mg via ORAL
  Filled 2022-06-03 (×4): qty 1
  Filled 2022-06-03: qty 4

## 2022-06-03 MED ORDER — HEPARIN BOLUS VIA INFUSION
4500.0000 [IU] | Freq: Once | INTRAVENOUS | Status: AC
  Administered 2022-06-03: 4500 [IU] via INTRAVENOUS
  Filled 2022-06-03: qty 4500

## 2022-06-03 MED ORDER — POLYETHYLENE GLYCOL 3350 17 G PO PACK
17.0000 g | PACK | Freq: Every day | ORAL | Status: DC | PRN
Start: 2022-06-03 — End: 2022-06-09

## 2022-06-03 MED ORDER — MELATONIN 5 MG PO TABS
5.0000 mg | ORAL_TABLET | Freq: Every evening | ORAL | Status: DC | PRN
Start: 2022-06-03 — End: 2022-06-09

## 2022-06-03 NOTE — ED Notes (Signed)
Called pharmacy to notifyneed 2000 dulera dose.

## 2022-06-03 NOTE — ED Notes (Addendum)
Vascular Surgeon, Dr. Wyn Quaker, at bedside for assessment.

## 2022-06-03 NOTE — H&P (Addendum)
History and Physical   Rachel Hahn KZL:[id number] DOB: 10-10-1956 DOA: 06/03/2022  PCP: [facility]  Outpatient Specialists: Dr. Beatrix Fetters, [facility] Cardiology Patient coming from: home  I have personally briefly reviewed patient's old medical records in [facility] EMR.  Chief Concern: Discoloration of the right foot  HPI: [name] is a 66 year old female with depression, hypertension, GERD, who presents emergency department for chief concerns of right fifth toe discoloration.  Initial vitals in the emergency department was unremarkable.  Her blood pressure was 174/88.  Serum sodium 139, potassium 4.4, chloride 106, bicarb 26, BUN of 23, serum creatinine 1.02, GFR greater than 60, nonfasting blood glucose 114, WBC 10.2, hemoglobin 14.1, platelets of 214.  CTA aorta plus bifemoral with or without contrast: Large amount of mural thrombus/plaque in the infrarenal abdominal aorta with focal severe stenosis.  Mural thrombus in the abdominal aorta is markedly increased since 2018.  Aortic mural thrombus likely source of embolic disease.  Bilateral runoff disease.  Occlusion of the posterior tibial artery and anterior tibial artery bilaterally.  Primary runoff vessel is the peroneal artery bilaterally.  Reconstitution of the distal posterior tibial arteries bilaterally.  Minimal flow at the origin of the IMA related to aortic disease and there is distal reconstitution.  Bilateral renal calculi with atrophy and cortical scarring in both kidneys.  Mild to moderate left hydronephrosis due to stones at the left renal pelvis.  Left hydronephrosis has slightly increased.  Bilateral renal cysts.  EDP treatment: Oxycodone 5 mg p.o. one-time dose.  At bedside patient is able to tell me her name, her age, and she knows she is in the hospital.  She reported that she initially noticed discoloration of her right foot about 1 week ago.  She reports that she noticed leg pain with  ambulation about 3 days ago.  She denies any known fever, chest pain, shortness of breath, abdominal pain, dysuria, hematuria, diarrhea, blood in her vagina.  She denies any fever, cough, chills, nausea, vomiting.  She reports these leg discoloration has never happened before.  Social history: She is a current tobacco user, smoking 1 ppd. She denies etoh and recreational drug use. She currently works as a Public relations account executive  ROS: Constitutional: no weight change, no fever ENT/Mouth: no sore throat, no rhinorrhea Eyes: no eye pain, no vision changes Cardiovascular: no chest pain, no dyspnea,  no edema, no palpitations Respiratory: no cough, no sputum, no wheezing Gastrointestinal: no nausea, no vomiting, no diarrhea, no constipation Genitourinary: no urinary incontinence, no dysuria, no hematuria Musculoskeletal: no arthralgias, no myalgias Skin: no skin lesions, no pruritus, Neuro: + weakness, no loss of consciousness, no syncope Psych: no anxiety, no depression, + decrease appetite Heme/Lymph: no bruising, no bleeding  ED Course: Discussed with emergency medicine provider, patient requiring hospitalization for chief concerns of acute peripheral artery disease.  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 Wyn Quaker has been consulted by EDP and states that once Endologix stent graft becomes available and once the OR has availability, patient will be taken to the OR for revascularization.  Vascular service has also requested cardiology involvement for cardiac clearance specifically stress testing - Continue heparin GTT - Pain management:  Oxycodone 5 mg p.o. every 6 hours as  needed for moderate pain, 4 days ordered; morphine 2 mg IV every 4 hours as needed for severe pain, 5 doses ordered - Patient will need podiatry consultation once revascularization with vascular surgery completed - High risk for CAD, vascular requesting cardiology evaluation for cardiac clearance - Cardiology, Dr. Juliann Pares has been consulted and states he will see the patient and likely order a Lexiscan Myoview - Admit to progressive cardiac, inpatient      Tobacco dependence - Nicotine prn patch ordered - Patient endorses readiness to stop tobacco use. - Greater than 3 minutes spent counseling patient on tobacco cessation Tobacco cessation counseling:  Week one, smoke 19 cigarettes per day. Week two, smoke 18 cigarettes per day. Week three, smoke 17 cigarettes per day, continue until smoking half the amount of cigarettes per day Discuss with PCP for pharmacologic assistance with smoking cessation Clean all indoor clothing, sheets, blankets, and freshen textile furniture to rid the smell of cigarettes Only smoke outside and wear outer covering Leave cigarettes and lighters outside in separate places During in-between cigarettes, if you feel the urge to smoke, use the following: stress squeezing devices/phone a trusted friend to talk you through the urge/walk in a safe environment If missing the feeling of holding a cigarette, cut a sipping straw to the length of a cigarette and hold it between your fingers Call 1800 QUIT NOW if in need of nicotine patches to help with cessation The choice to continue tobacco use with increased patient's risk of losing her legs in the future, poor wound healing, in addition to heart attacks, strokes  Primary hypertension -  atenolol 100 mg daily resumed  Depression - Sertraline 100 mg daily resumed  GERD (gastroesophageal reflux disease) - PPI  Chronic pain syndrome - Chronic low back pain - Lidocaine patch as needed ordered - Patient is  currently ordered oxycodone 5 mg every 6 hours.  For moderate pain and morphine 2 mg IV every 6 hours as needed for severe pain for acute right lower extremity pain  Chart reviewed.   DVT prophylaxis: Heparin GTT Code Status: Full code Diet: Heart healthy Family Communication: A phone call was offered, patient states that all her family members know she is in the hospital and has declined an additional phone call from the Disposition Plan: Pending clinical course, vascular and cardiology evaluation Consults called: Vascular, cardiology, pharmacy Admission status: Inpatient, progressive cardiac unit  Past Medical History:  Diagnosis Date   Anxiety    Depression    Hypertension    Kidney stones    Mitral valve prolapse    Past Surgical History:  Procedure Laterality Date   KIDNEY CYST REMOVAL     LITHOTRIPSY     TONSILLECTOMY     Social History:  reports that she has been smoking cigarettes. She has a 20.00 pack-year smoking history. She has never used smokeless tobacco. She reports that she does not drink alcohol and does not use drugs.  Allergies  Allergen Reactions   Nitrofurantoin Hives   Nsaids Shortness Of Breath   Codeine Nausea And Vomiting   Ibuprofen Other (See Comments)    Shortness of breath/chest pain   Morphine And Related Nausea And Vomiting   Prednisone Other (See Comments)    Makes her feel "woot-woot", she can tolerate Symbicort.   Sudafed [Pseudoephedrine Hcl] Other (See Comments)    Chest pain/shortness of breath   Family History  Problem Relation Age of Onset  Hypertension Other    Family history: Family history reviewed and not pertinent  Prior to Admission medications   Medication Sig Start Date End Date Taking? Authorizing Provider  acetaminophen (TYLENOL) 500 MG tablet Tylenol Extra Strength 500 mg tablet    [provider]  albuterol (VENTOLIN HFA) 108 (90 Base) MCG/ACT inhaler ProAir HFA    [provider]  albuterol  (VENTOLIN HFA) 108 (90 Base) MCG/ACT inhaler  07/13/21   [provider]  atenolol (TENORMIN) 100 MG tablet Take 100 mg by mouth daily. 08/31/21   [provider]  Cholecalciferol (VITAMIN D3) 1.25 MG (50000 UT) CAPS Vitamin D3    [provider]  pantoprazole (PROTONIX) 40 MG tablet Take 1 tablet (40 mg total) by mouth daily. 05/18/17   Gouru, Deanna Artis, MD  sertraline (ZOLOFT) 100 MG tablet Take 100 mg by mouth daily. 06/29/21   [provider]  Sertraline HCl (ZOLOFT PO) sertraline    [provider]  VITAMIN D-1000 MAX ST 25 MCG (1000 UT) tablet Take 1,000 Units by mouth daily. Patient not taking: Reported on 06/03/2022 08/24/21   [provider]   Physical Exam: Vitals:   06/03/22 1008 06/03/22 1010 06/03/22 1559  BP:  (!) 174/88 134/74  Pulse:  77 76  Resp:  16 18  Temp:  98.2 F (36.8 C)   TempSrc:  Oral   SpO2:  96% 95%  Weight: 78.5 kg    Height: 5\' 1"  (1.549 m)     Constitutional: appears age-appropriate, NAD, calm, comfortable Eyes: PERRL, lids and conjunctivae normal ENMT: Mucous membranes are moist. Posterior pharynx clear of any exudate or lesions. Age-appropriate dentition. Hearing appropriate Neck: normal, supple, no masses, no thyromegaly Respiratory: clear to auscultation bilaterally, no wheezing, no crackles. Normal respiratory effort. No accessory muscle use.  Cardiovascular: Regular rate and rhythm, no murmurs / rubs / gallops. No extremity edema. 1+ pedal pulses of the bilateral lower extremities. No carotid bruits.  Abdomen: Obese abdomen, no tenderness, no masses palpated, no hepatosplenomegaly. Bowel sounds positive.  Musculoskeletal: no clubbing / cyanosis. No joint deformity upper and lower extremities. Good ROM, no contractures, no atrophy. Normal muscle tone.  Skin: no rashes, lesions, ulcers. No induration.  Discoloration of the right foot is noted per imaging    Neurologic: Sensation intact. Strength 5/5 in  all 4.  Psychiatric: Normal judgment and insight. Alert and oriented x 3. Normal mood.   EKG: Not indicated at this time  Chest x-ray on Admission: Not indicated at this time  CT ANGIO AO+BIFEM W & OR WO CONTRAST  Result Date: 06/03/2022 CLINICAL DATA:  Discoloration in the feet. EXAM: CT ANGIOGRAPHY OF ABDOMINAL AORTA WITH ILIOFEMORAL RUNOFF TECHNIQUE: Multidetector CT imaging of the abdomen, pelvis and lower extremities was performed using the standard protocol during bolus administration of intravenous contrast. Multiplanar CT image reconstructions and MIPs were obtained to evaluate the vascular anatomy. RADIATION DOSE REDUCTION: This exam was performed according to the departmental dose-optimization program which includes automated exposure control, adjustment of the mA and/or kV according to patient size and/or use of iterative reconstruction technique. CONTRAST:  06/05/2022 OMNIPAQUE IOHEXOL 350 MG/ML SOLN COMPARISON:  CTA chest, abdomen and pelvis 05/17/2017 FINDINGS: VASCULAR Aorta: Small amount of atherosclerotic disease involving the distal descending thoracic aorta with normal caliber. The proximal abdominal aorta is widely patent with minimal atherosclerotic disease and no aneurysm. There is a large amount mural thrombus involving the infrarenal abdominal aorta with near occlusion on sequence 4, image 75. The  amount of mural thrombus and degree of stenosis in the infrarenal abdominal aorta has markedly increased since 2018. Negative for an aortic aneurysm or dissection. Celiac: Patent without evidence of aneurysm, dissection, vasculitis or significant stenosis. SMA: Patent without evidence of aneurysm, dissection, vasculitis or significant stenosis. Renals: Both renal arteries are patent without evidence of aneurysm, dissection, vasculitis, fibromuscular dysplasia or significant stenosis. IMA: Minimal flow near the origin probably related to the aortic plaque. There is flow in the IMA and suspect  that large portion of the flow is supplied from SMA collateral flow. RIGHT Lower Extremity Inflow: Mild narrowing near the origin related to noncalcified plaque. Otherwise, the right common iliac artery is patent. Right internal and right external iliac arteries are widely patent. Outflow: Common, superficial and profunda femoral arteries and the popliteal artery are patent without evidence of aneurysm, dissection, vasculitis or significant stenosis. Runoff: Proximal runoff vessels are patent and the peroneal artery is patent to the ankle. Posterior tibial artery occludes in the mid calf and there is some distal reconstitution at the ankle with flow in the plantar branches. Anterior tibial artery occludes in the mid/distal calf. No significant flow in the dorsalis pedis artery. LEFT Lower Extremity Inflow: Common, internal and external iliac arteries are patent without evidence of aneurysm, dissection, vasculitis or significant stenosis. Outflow: Common, superficial and profunda femoral arteries and the popliteal artery are patent without evidence of aneurysm, dissection, vasculitis or significant stenosis. Runoff: No significant flow in the posterior tibial artery. Peroneal artery is patent down to the ankle and there is some reconstitution of the distal posterior tibial artery and plantar arteries. The anterior tibial artery occludes in the distal calf and there is no significant flow in the dorsalis pedis artery. Veins: No obvious venous abnormality within the limitations of this arterial phase study. Review of the MIP images confirms the above findings. NON-VASCULAR Lower chest: Volume loss at both lung bases. No large pleural effusions. Hepatobiliary: Again noted is a small low-density structure in the anterior liver that measures roughly 5 mm and could represent a cyst. Otherwise, stable appearance of the liver without acute findings. No acute abnormality to the gallbladder. No significant biliary dilatation.  Pancreas: Unremarkable. No pancreatic ductal dilatation or surrounding inflammatory changes. Spleen: Normal in size without focal abnormality. Adrenals/Urinary Tract: 1.6 cm left adrenal nodule with Hounsfield units measuring -13 and this is most compatible with a left adrenal adenoma. Normal appearance of the right adrenal gland. Bilateral renal atrophy with probable bilateral cortical scarring. Again noted are multiple right renal calculi and largest measures 6 mm in the right kidney lower pole. There is no significant right hydronephrosis. No evidence for right ureter stones. There is at least 1 cortical cyst in the right kidney. Again noted are parapelvic and cortical left renal cysts. Mild-moderate dilatation of left renal calices with at least 3 stones in left renal pelvis and the largest measures 6 mm. Multiple left renal calculi. No significant dilatation of the left ureter. No perinephric edema or stranding. Normal appearance of the urinary bladder. Stomach/Bowel: Scattered colonic diverticula without acute bowel inflammation. Normal appearance of the stomach. No evidence for bowel dilatation or obstruction. Normal appendix. Lymphatic: No abdominal or pelvic lymph node enlargement. Index left periaortic lymph node measures 8 mm in the short axis on sequence 4 image 65. Reproductive: Uterus and bilateral adnexa are unremarkable. Other: Small inguinal hernias containing fat. Tiny umbilical hernia containing fat. Negative for free fluid. Negative for free air. Musculoskeletal: No acute bone abnormality.  Subcutaneous edema in the calves. IMPRESSION: VASCULAR 1. 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 The amount of mural thrombus in the abdominal aorta has markedly increased since 2018. This aortic mural thrombus is likely the source for embolic disease. 2. Bilateral runoff disease. Occlusions in the posterior tibial artery and anterior tibial artery  bilaterally are likely related to embolic disease. Primary runoff vessel is the peroneal artery bilaterally. Reconstitution of the distal posterior tibial arteries bilaterally. 3. No significant outflow disease. 4. Minimal flow at the origin of the IMA related to the aortic disease but there is distal reconstitution. The other main visceral arteries are widely patent. NON-VASCULAR 1. Bilateral renal calculi with atrophy and cortical scarring in both kidneys. Mild-to-moderate left hydronephrosis due to stones at the left renal pelvis. Left hydronephrosis has slightly increased since 2018. Bilateral renal cysts. 2. Left adrenal adenoma. 3. Small umbilical hernia and small bilateral inguinal hernias. Electronically Signed   By: Richarda Overlie M.D.   On: 06/03/2022 13:29    Labs on Admission: I have personally reviewed following labs   CBC: Recent Labs  Lab 06/03/22 1013  WBC 10.2  NEUTROABS 6.7  HGB 14.1  HCT 45.2  MCV 94.6  PLT 214   Basic Metabolic Panel: Recent Labs  Lab 06/03/22 1013  NA 139  K 4.4  CL 106  CO2 26  GLUCOSE 114*  BUN 23  CREATININE 1.02*  CALCIUM 9.5   GFR: Estimated Creatinine Clearance: 52.9 mL/min (A) (by C-G formula based on SCr of 1.02 mg/dL (H)).  Liver Function Tests: Recent Labs  Lab 06/03/22 1013  AST 17  ALT 17  ALKPHOS 85  BILITOT 0.5  PROT 7.2  ALBUMIN 3.9   Dr. Sedalia Muta [facility]  If 7PM-7AM, please contact overnight-coverage provider If 7AM-7PM, please contact day coverage provider [email or web address]  06/03/2022, 5:05 PM

## 2022-06-03 NOTE — ED Notes (Signed)
Incentive spirometry provided to pt.

## 2022-06-03 NOTE — Hospital Course (Signed)
[name] is a 66 year old female with depression, hypertension, GERD, who presents emergency department for chief concerns of right fifth toe discoloration.  Initial vitals in the emergency department was unremarkable.  Her blood pressure was 174/88.  Serum sodium 139, potassium 4.4, chloride 106, bicarb 26, BUN of 23, serum creatinine 1.02, GFR greater than 60, nonfasting blood glucose 114, WBC 10.2, hemoglobin 14.1, platelets of 214.  CTA aorta plus bifemoral with or without contrast: Large amount of mural thrombus/plaque in the infrarenal abdominal aorta with focal severe stenosis.  Mural thrombus in the abdominal aorta is markedly increased since 2018.  Aortic mural thrombus likely source of embolic disease.  Bilateral runoff disease.  Occlusion of the posterior tibial artery and anterior tibial artery bilaterally.  Primary runoff vessel is the peroneal artery bilaterally.  Reconstitution of the distal posterior tibial arteries bilaterally.  Minimal flow at the origin of the IMA related to aortic disease and there is distal reconstitution.  Bilateral renal calculi with atrophy and cortical scarring in both kidneys.  Mild to moderate left hydronephrosis due to stones at the left renal pelvis.  Left hydronephrosis has slightly increased.  Bilateral renal cysts.  EDP treatment: Oxycodone 5 mg p.o. one-time dose.

## 2022-06-03 NOTE — ED Provider Notes (Signed)
[facility] Provider Note    Event Date/Time   First MD Initiated Contact with Patient 06/03/22 1110     (approximate)   History   Circulatory Problem   HPI  Rachel Hahn is a 66 y.o. female presents to the emergency department for treatment and evaluation of toe and foot discoloration.  Patient states that she noted discoloration of her little toe on the right foot about a week ago and it has progressively gotten worse.  She is now noticing the discoloration on the middle toe as well.  She feels that the left foot is also becoming discolored over the top of the foot and the base of third and fourth toes.  She has been referred to see the vascular specialist but the appointment is not until July.  She is concerned because the discoloration is spreading.  She states that the toes are sensitive to touch and are burning.  She has been using ice packs especially at night.  Past Medical History:  Diagnosis Date   Anxiety    Depression    Hypertension    Kidney stones    Mitral valve prolapse      Physical Exam   Triage Vital Signs: ED Triage Vitals  Enc Vitals Group     BP 06/03/22 1010 (!) 174/88     Pulse Rate 06/03/22 1010 77     Resp 06/03/22 1010 16     Temp 06/03/22 1010 98.2 F (36.8 C)     Temp Source 06/03/22 1010 Oral     SpO2 06/03/22 1010 96 %     Weight 06/03/22 1008 173 lb (78.5 kg)     Height 06/03/22 1008 5\' 1"  (1.549 m)     Head Circumference --      Peak Flow --      Pain Score 06/03/22 1008 0     Pain Loc --      Pain Edu? --      Excl. in GC? --     Most recent vital signs: Vitals:   06/03/22 1010 06/03/22 1559  BP: (!) 174/88 134/74  Pulse: 77 76  Resp: 16 18  Temp: 98.2 F (36.8 C)   SpO2: 96% 95%    General: Awake, no distress.  CV:  DP pulses present bilaterally. Resp:  Normal effort.  Abd:  No distention.  Other:  Skin overlying the right fifth toe and just above the M CP the right foot bluish in color.   Mottling is noted over the dorsal aspect of the left foot with bluish discoloration on the plantar surface of the third and fourth toes.   ED Results / Procedures / Treatments   Labs (all labs ordered are listed, but only abnormal results are displayed) Labs Reviewed  COMPREHENSIVE METABOLIC PANEL - Abnormal; Notable for the following components:      Result Value   Glucose, Bld 114 (*)    Creatinine, Ser 1.02 (*)    All other components within normal limits  CBC WITH DIFFERENTIAL/PLATELET  APTT  PROTIME-INR  HIV ANTIBODY (ROUTINE TESTING W REFLEX)  HEPARIN LEVEL (UNFRACTIONATED)     EKG  Not indicated.   RADIOLOGY  CT angio aorta and bifemoral with runoff shows large amount of mural thrombus in the infrarenal abdominal aorta with focal severe stenosis.  Bilateral runoff disease with occlusions in the posterior tibial artery in the anterior tibial artery bilaterally.  Nonvascular findings show bilateral renal calculi with atrophy and cortical scarring  in both kidneys with mild to moderate left hydronephrosis due to stones at the left renal pelvis.  I have independently reviewed and interpreted imaging as well as reviewed report from radiology.  PROCEDURES:  Critical Care performed: Yes, see critical care procedure note(s)  Procedures   MEDICATIONS ORDERED IN ED:  Medications  acetaminophen (TYLENOL) tablet 650 mg (has no administration in time range)    Or  acetaminophen (TYLENOL) suppository 650 mg (has no administration in time range)  ondansetron (ZOFRAN) tablet 4 mg (has no administration in time range)    Or  ondansetron (ZOFRAN) injection 4 mg (has no administration in time range)  senna-docusate (Senokot-S) tablet 1 tablet (has no administration in time range)  polyethylene glycol (MIRALAX / GLYCOLAX) packet 17 g (has no administration in time range)  heparin ADULT infusion 100 units/mL (25000 units/24mL) (1,100 Units/hr Intravenous New Bag/Given 06/03/22  1515)  oxyCODONE (Oxy IR/ROXICODONE) immediate release tablet 5 mg (has no administration in time range)  morphine (PF) 2 MG/ML injection 2 mg (has no administration in time range)  oxyCODONE (Oxy IR/ROXICODONE) immediate release tablet 5 mg (5 mg Oral Given 06/03/22 1207)  iohexol (OMNIPAQUE) 350 MG/ML injection 125 mL (125 mLs Intravenous Contrast Given 06/03/22 1227)  heparin bolus via infusion 4,500 Units (4,500 Units Intravenous Bolus from Bag 06/03/22 1517)     IMPRESSION / MDM / ASSESSMENT AND PLAN / ED COURSE   I reviewed the triage vital signs and the nursing notes.  Differential diagnosis includes, but is not limited to: Vasospasm, emboli, musculoskeletal injury  Patient's presentation is most consistent with acute presentation with potential threat to life or bodily function.  66 year old female presenting to the emergency department for treatment and evaluation of discoloration to the right foot.  See HPI for further details.  Patient denies any history of PE/DVT and is not currently on any anticoagulants.  Plan will be to get a CTA with runoff to assess for occlusion.  Patient is aware and agreeable to the plan.  Labs are overall reassuring.  She has no leukocytosis.  CMP is unremarkable with exception of a slight bump in her creatinine 1.02.  CT shows sites of multiple emboli most notably of the infrarenal abdominal aorta and posterior and anterior tibial artery.  Plan will be to consult vascular.  Consulted with Dr. Wyn Quaker who recommends anticoagulation and admission.  Plan discussed with the patient who reluctantly agrees to stay.  Importance of compliance was stressed to the patient and visitor.  Hospitalist, Dr. Sedalia Muta, has accepted patient for admission.  CRITICAL CARE Performed by: Kem Boroughs   Total critical care time: 30 minutes  Critical care time was exclusive of separately billable procedures and treating other patients.  Critical care was necessary to treat or  prevent imminent or life-threatening deterioration.  Critical care was time spent personally by me on the following activities: development of treatment plan with patient and/or surrogate as well as nursing, discussions with consultants, evaluation of patient's response to treatment, examination of patient, obtaining history from patient or surrogate, ordering and performing treatments and interventions, ordering and review of laboratory studies, ordering and review of radiographic studies, pulse oximetry and re-evaluation of patient's condition.      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   Chinita Pester, FNP 06/03/22 1604    Sharman Cheek, MD 06/04/22 2325

## 2022-06-03 NOTE — ED Triage Notes (Addendum)
Patient presents with right foot, right pinky discoloration. Seen urgent care X2 days ago and they gave her a ortho shoe to wear and follow up with vascular. Patient reports the toe discoloration has gotten worse and spread up foot. Patient also has discoloration starting on left toes

## 2022-06-03 NOTE — Assessment & Plan Note (Addendum)
Presented with right fifth toe discoloration, nearly black consistent with early critical limb ischemia.  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  Right fifth toe still slightly dusky but overall appears much better  Outpatient follow-up, if recurrence may need to involve podiatry however no need to keep inpatient for this at this point

## 2022-06-03 NOTE — Assessment & Plan Note (Addendum)
Nicotine patch ordered. Tobacco cessation counseling done on admission, to continue given extensive vascular disease, cessation is absolutely necessary.. -- Patient declined nicotine patch or gum for withdrawal symptoms

## 2022-06-03 NOTE — Assessment & Plan Note (Addendum)
Continue on atenolol

## 2022-06-03 NOTE — Consult Note (Signed)
ANTICOAGULATION CONSULT NOTE  Pharmacy Consult for Heparin infusion Indication: DVT  Allergies  Allergen Reactions   Nitrofurantoin Hives   Nsaids Shortness Of Breath   Codeine Nausea And Vomiting   Ibuprofen Other (See Comments)    Shortness of breath/chest pain   Morphine And Related Nausea And Vomiting   Prednisone Other (See Comments)    Makes her feel "woot-woot", she can tolerate Symbicort.   Sudafed [Pseudoephedrine Hcl] Other (See Comments)    Chest pain/shortness of breath    Patient Measurements: Height: 5\' 1"  (154.9 cm) Weight: 78.5 kg (173 lb) IBW/kg (Calculated) : 47.8 Heparin Dosing Weight: 65.4 kg  Vital Signs: Temp: 98.2 F (36.8 C) (06/16 1010) Temp Source: Oral (06/16 1010) BP: 117/67 (06/16 2031) Pulse Rate: 78 (06/16 2027)  Labs: Recent Labs    06/03/22 1013 06/03/22 1513 06/03/22 2052  HGB 14.1  --   --   HCT 45.2  --   --   PLT 214  --   --   APTT  --  26  --   LABPROT  --  12.3  --   INR  --  0.9  --   HEPARINUNFRC  --   --  0.84*  CREATININE 1.02*  --   --      Estimated Creatinine Clearance: 52.9 mL/min (A) (by C-G formula based on SCr of 1.02 mg/dL (H)).   Medical History: Past Medical History:  Diagnosis Date   Anxiety    Depression    Hypertension    Kidney stones    Mitral valve prolapse     Medications:  No history of chronic anticoagulant use PTA  Assessment: Pharmacy has been consulted to initiate and monitor heparin in 65yo patient admitted with right foot pain and discoloration. CT results positive for mural thrombus/plaque and occlusions in the posterior tibial artery and anterior tibial artery bilaterally(likely related to embolic disease). Baseline labs have been ordered and are pending.  Goal of Therapy:  Heparin level 0.3-0.7 units/ml Monitor platelets by anticoagulation protocol: Yes  Date Time HL Rate/Comment 6/16 2052 0.84 Supratherapeutic/Decrease 1100 > 950 u/hr  Plan:  Decrease heparin infusion to  950 units/hr Check anti-Xa level in 6 hours and daily while on heparin Continue to monitor H&H and platelets  2053 06/03/2022,9:33 PM

## 2022-06-03 NOTE — Assessment & Plan Note (Addendum)
Continue home sertraline 

## 2022-06-03 NOTE — Assessment & Plan Note (Signed)
History of chronic low back pain. - Lidocaine patch as needed ordered - Pain control per orders, adjust as needed if pain is uncontrolled

## 2022-06-03 NOTE — Assessment & Plan Note (Signed)
Continue PPI ?

## 2022-06-03 NOTE — Consult Note (Signed)
[facility] Vascular Consult Note  MRN : [id number]  Rachel Hahn is a 66 y.o. (27-Mar-1956) female who presents with chief complaint of  Chief Complaint  Patient presents with   Circulatory Problem  .  History of Present Illness: Patient presents to the emergency room with dark discoloration on toes of both feet as well as painful feet.  This is now been going on for about a week.  She had an outpatient referral but her symptoms worsened and she presented to the emergency department today.  There were no trauma, injury, or other causative factors.  No fevers or chills.  She does have tiredness in her legs with activity.  In the emergency department, a CT scan of the abdomen pelvis was done which I have independently reviewed.  This demonstrates a near occlusive lesion in the mid to distal infrarenal aorta with significant surrounding thrombus.  This extends into the proximal portion of the iliac vessels more so on the right than the left.  There is an infrarenal neck before the stenosis begins and normalization of the iliac arteries prior to the hypogastric arteries.  Current Facility-Administered Medications  Medication Dose Route Frequency Provider Last Rate Last Admin   acetaminophen (TYLENOL) tablet 650 mg  650 mg Oral Q6H PRN Cox, Amy N, DO       Or   acetaminophen (TYLENOL) suppository 650 mg  650 mg Rectal Q6H PRN Cox, Amy N, DO       heparin ADULT infusion 100 units/mL (25000 units/24mL)  1,100 Units/hr Intravenous Continuous Mila Merry A, RPH 11 mL/hr at 06/03/22 1515 1,100 Units/hr at 06/03/22 1515   morphine (PF) 2 MG/ML injection 2 mg  2 mg Intravenous Q4H PRN Cox, Amy N, DO       ondansetron (ZOFRAN) tablet 4 mg  4 mg Oral Q6H PRN Cox, Amy N, DO       Or   ondansetron (ZOFRAN) injection 4 mg  4 mg Intravenous Q6H PRN Cox, Amy N, DO       oxyCODONE (Oxy IR/ROXICODONE) immediate release tablet 5 mg  5 mg Oral Q6H PRN Cox, Amy N, DO       polyethylene  glycol (MIRALAX / GLYCOLAX) packet 17 g  17 g Oral Daily PRN Cox, Amy N, DO       senna-docusate (Senokot-S) tablet 1 tablet  1 tablet Oral QHS PRN Cox, Amy N, DO       Current Outpatient Medications  Medication Sig Dispense Refill   acetaminophen (TYLENOL) 500 MG tablet Tylenol Extra Strength 500 mg tablet     atenolol (TENORMIN) 100 MG tablet Take 100 mg by mouth daily.     baclofen (LIORESAL) 10 MG tablet Take 1 tablet by mouth daily.     budesonide-formoterol (SYMBICORT) 160-4.5 MCG/ACT inhaler Inhale 2 puffs into the lungs 2 (two) times daily.     Cholecalciferol (VITAMIN D3) 1.25 MG (50000 UT) CAPS Vitamin D3     gabapentin (NEURONTIN) 100 MG capsule Take 1 capsule by mouth daily.     pantoprazole (PROTONIX) 40 MG tablet Take 1 tablet (40 mg total) by mouth daily. 30 tablet 0   sertraline (ZOLOFT) 100 MG tablet Take 100 mg by mouth daily.     albuterol (VENTOLIN HFA) 108 (90 Base) MCG/ACT inhaler ProAir HFA (Patient not taking: Reported on 06/03/2022)     albuterol (VENTOLIN HFA) 108 (90 Base) MCG/ACT inhaler  (Patient not taking: Reported on 06/03/2022)     Sertraline HCl (  ZOLOFT PO) sertraline (Patient not taking: Reported on 06/03/2022)     VITAMIN D-1000 MAX ST 25 MCG (1000 UT) tablet Take 1,000 Units by mouth daily. (Patient not taking: Reported on 06/03/2022)      Past Medical History:  Diagnosis Date   Anxiety    Depression    Hypertension    Kidney stones    Mitral valve prolapse     Past Surgical History:  Procedure Laterality Date   KIDNEY CYST REMOVAL     LITHOTRIPSY     TONSILLECTOMY       Social History   Tobacco Use   Smoking status: Every Day    Packs/day: 0.50    Years: 20.00    Total pack years: 10.00    Types: Cigarettes   Smokeless tobacco: Never  Substance Use Topics   Alcohol use: No   Drug use: No     Family History  Problem Relation Age of Onset   Hypertension Other   No bleeding or clotting disorders, no aneurysms  Allergies  Allergen  Reactions   Nitrofurantoin Hives   Nsaids Shortness Of Breath   Codeine Nausea And Vomiting   Ibuprofen Other (See Comments)    Shortness of breath/chest pain   Morphine And Related Nausea And Vomiting   Prednisone Other (See Comments)    Makes her feel "woot-woot", she can tolerate Symbicort.   Sudafed [Pseudoephedrine Hcl] Other (See Comments)    Chest pain/shortness of breath     REVIEW OF SYSTEMS (Negative unless checked)  Constitutional: [] Weight loss  [] Fever  [] Chills Cardiac: [] Chest pain   [] Chest pressure   [] Palpitations   [] Shortness of breath when laying flat   [] Shortness of breath at rest   [x] Shortness of breath with exertion. Vascular:  [] Pain in legs with walking   [] Pain in legs at rest   [] Pain in legs when laying flat   [] Claudication   [] Pain in feet when walking  [] Pain in feet at rest  [x] Pain in feet when laying flat   [] History of DVT   [] Phlebitis   [] Swelling in legs   [] Varicose veins   [] Non-healing ulcers Pulmonary:   [] Uses home oxygen   [] Productive cough   [] Hemoptysis   [] Wheeze  [] COPD   [] Asthma Neurologic:  [] Dizziness  [] Blackouts   [] Seizures   [] History of stroke   [] History of TIA  [] Aphasia   [] Temporary blindness   [] Dysphagia   [] Weakness or numbness in arms   [] Weakness or numbness in legs Musculoskeletal:  [] Arthritis   [] Joint swelling   [] Joint pain   [] Low back pain Hematologic:  [] Easy bruising  [] Easy bleeding   [] Hypercoagulable state   [] Anemic  [] Hepatitis Gastrointestinal:  [] Blood in stool   [] Vomiting blood  [] Gastroesophageal reflux/heartburn   [] Difficulty swallowing. Genitourinary:  [] Chronic kidney disease   [] Difficult urination  [] Frequent urination  [] Burning with urination   [] Blood in urine Skin:  [] Rashes   [] Ulcers   [] Wounds Psychological:  [x] History of anxiety   [x]  History of major depression.  Physical Examination  Vitals:   06/03/22 1008 06/03/22 1010 06/03/22 1559  BP:  (!) 174/88 134/74  Pulse:  77 76   Resp:  16 18  Temp:  98.2 F (36.8 C)   TempSrc:  Oral   SpO2:  96% 95%  Weight: 78.5 kg    Height: 5\' 1"  (1.549 m)     Body mass index is 32.69 kg/m. Gen:  WD/WN, NAD Head: [place]/AT, No temporalis wasting.  Ear/Nose/Throat: Hearing grossly intact, nares w/o erythema or drainage, oropharynx w/o Erythema/Exudate Eyes: Sclera non-icteric, conjunctiva clear. Dysconjugate gaze. Neck: Trachea midline.  No JVD.  Pulmonary:  Good air movement, respirations not labored, equal bilaterally.  Cardiac: RRR, normal S1, S2. Vascular:  Vessel Right Left  Radial Palpable Palpable                  Femoral 1+ Palpable 1+ Palpable      PT 1+ Palpable 1+ Palpable  DP 1+ Palpable 1+ Palpable   Gastrointestinal: soft, non-tender/non-distended.  Musculoskeletal: M/S 5/5 throughout.  Right fifth toe is gangrenous and has fixed skin changes and is likely nonsalvageable.  Toes 1, 3, and 4 on the left foot have dark discoloration and areas of pregangrenous and gangrenous changes Neurologic: Sensation grossly intact in extremities.  Symmetrical.  Speech is fluent. Motor exam as listed above. Psychiatric: Judgment intact, Mood & affect appropriate for pt's clinical situation. Dermatologic: No rashes or ulcers noted.  No cellulitis or open wounds.      CBC Lab Results  Component Value Date   WBC 10.2 06/03/2022   HGB 14.1 06/03/2022   HCT 45.2 06/03/2022   MCV 94.6 06/03/2022   PLT 214 06/03/2022    BMET    Component Value Date/Time   NA 139 06/03/2022 1013   K 4.4 06/03/2022 1013   CL 106 06/03/2022 1013   CO2 26 06/03/2022 1013   GLUCOSE 114 (H) 06/03/2022 1013   BUN 23 06/03/2022 1013   CREATININE 1.02 (H) 06/03/2022 1013   CALCIUM 9.5 06/03/2022 1013   GFRNONAA >60 06/03/2022 1013   GFRAA >60 05/18/2017 0130   Estimated Creatinine Clearance: 52.9 mL/min (A) (by C-G formula based on SCr of 1.02 mg/dL (H)).  COAG Lab Results  Component Value Date   INR 0.9 06/03/2022     Radiology CT ANGIO AO+BIFEM W & OR WO CONTRAST  Result Date: 06/03/2022 CLINICAL DATA:  Discoloration in the feet. EXAM: CT ANGIOGRAPHY OF ABDOMINAL AORTA WITH ILIOFEMORAL RUNOFF TECHNIQUE: Multidetector CT imaging of the abdomen, pelvis and lower extremities was performed using the standard protocol during bolus administration of intravenous contrast. Multiplanar CT image reconstructions and MIPs were obtained to evaluate the vascular anatomy. RADIATION DOSE REDUCTION: This exam was performed according to the departmental dose-optimization program which includes automated exposure control, adjustment of the mA and/or kV according to patient size and/or use of iterative reconstruction technique. CONTRAST:  159mL OMNIPAQUE IOHEXOL 350 MG/ML SOLN COMPARISON:  CTA chest, abdomen and pelvis 05/17/2017 FINDINGS: VASCULAR Aorta: Small amount of atherosclerotic disease involving the distal descending thoracic aorta with normal caliber. The proximal abdominal aorta is widely patent with minimal atherosclerotic disease and no aneurysm. There is a large amount mural thrombus involving the infrarenal abdominal aorta with near occlusion on sequence 4, image 75. The amount of mural thrombus and degree of stenosis in the infrarenal abdominal aorta has markedly increased since 2018. Negative for an aortic aneurysm or dissection. Celiac: Patent without evidence of aneurysm, dissection, vasculitis or significant stenosis. SMA: Patent without evidence of aneurysm, dissection, vasculitis or significant stenosis. Renals: Both renal arteries are patent without evidence of aneurysm, dissection, vasculitis, fibromuscular dysplasia or significant stenosis. IMA: Minimal flow near the origin probably related to the aortic plaque. There is flow in the IMA and suspect that large portion of the flow is supplied from SMA collateral flow. RIGHT Lower Extremity Inflow: Mild narrowing near the origin related to noncalcified plaque.  Otherwise, the right common iliac  artery is patent. Right internal and right external iliac arteries are widely patent. Outflow: Common, superficial and profunda femoral arteries and the popliteal artery are patent without evidence of aneurysm, dissection, vasculitis or significant stenosis. Runoff: Proximal runoff vessels are patent and the peroneal artery is patent to the ankle. Posterior tibial artery occludes in the mid calf and there is some distal reconstitution at the ankle with flow in the plantar branches. Anterior tibial artery occludes in the mid/distal calf. No significant flow in the dorsalis pedis artery. LEFT Lower Extremity Inflow: Common, internal and external iliac arteries are patent without evidence of aneurysm, dissection, vasculitis or significant stenosis. Outflow: Common, superficial and profunda femoral arteries and the popliteal artery are patent without evidence of aneurysm, dissection, vasculitis or significant stenosis. Runoff: No significant flow in the posterior tibial artery. Peroneal artery is patent down to the ankle and there is some reconstitution of the distal posterior tibial artery and plantar arteries. The anterior tibial artery occludes in the distal calf and there is no significant flow in the dorsalis pedis artery. Veins: No obvious venous abnormality within the limitations of this arterial phase study. Review of the MIP images confirms the above findings. NON-VASCULAR Lower chest: Volume loss at both lung bases. No large pleural effusions. Hepatobiliary: Again noted is a small low-density structure in the anterior liver that measures roughly 5 mm and could represent a cyst. Otherwise, stable appearance of the liver without acute findings. No acute abnormality to the gallbladder. No significant biliary dilatation. Pancreas: Unremarkable. No pancreatic ductal dilatation or surrounding inflammatory changes. Spleen: Normal in size without focal abnormality. Adrenals/Urinary  Tract: 1.6 cm left adrenal nodule with Hounsfield units measuring -13 and this is most compatible with a left adrenal adenoma. Normal appearance of the right adrenal gland. Bilateral renal atrophy with probable bilateral cortical scarring. Again noted are multiple right renal calculi and largest measures 6 mm in the right kidney lower pole. There is no significant right hydronephrosis. No evidence for right ureter stones. There is at least 1 cortical cyst in the right kidney. Again noted are parapelvic and cortical left renal cysts. Mild-moderate dilatation of left renal calices with at least 3 stones in left renal pelvis and the largest measures 6 mm. Multiple left renal calculi. No significant dilatation of the left ureter. No perinephric edema or stranding. Normal appearance of the urinary bladder. Stomach/Bowel: Scattered colonic diverticula without acute bowel inflammation. Normal appearance of the stomach. No evidence for bowel dilatation or obstruction. Normal appendix. Lymphatic: No abdominal or pelvic lymph node enlargement. Index left periaortic lymph node measures 8 mm in the short axis on sequence 4 image 65. Reproductive: Uterus and bilateral adnexa are unremarkable. Other: Small inguinal hernias containing fat. Tiny umbilical hernia containing fat. Negative for free fluid. Negative for free air. Musculoskeletal: No acute bone abnormality. Subcutaneous edema in the calves. IMPRESSION: VASCULAR 1. 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 The amount of mural thrombus in the abdominal aorta has markedly increased since 2018. This aortic mural thrombus is likely the source for embolic disease. 2. Bilateral runoff disease. Occlusions in the posterior tibial artery and anterior tibial artery bilaterally are likely related to embolic disease. Primary runoff vessel is the peroneal artery bilaterally. Reconstitution of the distal posterior tibial  arteries bilaterally. 3. No significant outflow disease. 4. Minimal flow at the origin of the IMA related to the aortic disease but there is distal reconstitution. The other main visceral arteries are  widely patent. NON-VASCULAR 1. Bilateral renal calculi with atrophy and cortical scarring in both kidneys. Mild-to-moderate left hydronephrosis due to stones at the left renal pelvis. Left hydronephrosis has slightly increased since 2018. Bilateral renal cysts. 2. Left adrenal adenoma. 3. Small umbilical hernia and small bilateral inguinal hernias. Electronically Signed   By: Markus Daft M.D.   On: 06/03/2022 13:29      Assessment/Plan 1.  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 CT scan of the abdomen pelvis was done which I have independently reviewed.  This demonstrates a near occlusive lesion in the mid to distal infrarenal aorta with significant surrounding thrombus.  This extends into the proximal portion of the iliac vessels more so on the right than the left.  There is an infrarenal neck before the stenosis begins and normalization of the iliac arteries prior to the hypogastric arteries. This is a critical and limb threatening situation.  I suspect she will lose at least toe 5 on the right foot and she may lose toes on the left foot although these are potentially salvageable.  This appears to be secondary to atheroembolic disease as well as poor perfusion from a very tight aortic lesion.  This will be best managed with an Endologix stent graft for treatment of occlusive disease rather than open surgical therapy with an aortobiiliac bypass or aortoiliac endarterectomy.  This would be far less morbid.  I would recommend she be anticoagulated and admitted to the hospital.  I will appreciate a cardiology work-up for preoperative clearance that she will require general anesthesia.  We will try to get the Endologix stent graft obtained and get operating room time for 1 day next week for  repair.  This was discussed with the patient and her family and they voiced their understanding 2.  Tobacco use.  Likely an underlying risk factor for causation of her aortoiliac disease. 3.  Hypertension. Stable on outpatient medications and blood pressure control important in reducing the progression of atherosclerotic disease. On appropriate oral medications.    Leotis Pain, MD  06/03/2022 4:00 PM    This note was created with Dragon medical transcription system.  Any error is purely unintentional

## 2022-06-03 NOTE — ED Notes (Signed)
Pt up to restroom, po fluids provided, vss.

## 2022-06-03 NOTE — Consult Note (Signed)
ANTICOAGULATION CONSULT NOTE  Pharmacy Consult for Heparin infusion Indication: DVT  Allergies  Allergen Reactions   Nitrofurantoin Hives   Nsaids Shortness Of Breath   Codeine Nausea And Vomiting   Ibuprofen Other (See Comments)    Shortness of breath/chest pain   Morphine And Related Nausea And Vomiting   Sudafed [Pseudoephedrine Hcl] Other (See Comments)    Chest pain/shortness of breath    Patient Measurements: Height: 5\' 1"  (154.9 cm) Weight: 78.5 kg (173 lb) IBW/kg (Calculated) : 47.8 Heparin Dosing Weight: 65.4 kg  Vital Signs: Temp: 98.2 F (36.8 C) (06/16 1010) Temp Source: Oral (06/16 1010) BP: 174/88 (06/16 1010) Pulse Rate: 77 (06/16 1010)  Labs: Recent Labs    06/03/22 1013  HGB 14.1  HCT 45.2  PLT 214  CREATININE 1.02*    Estimated Creatinine Clearance: 52.9 mL/min (A) (by C-G formula based on SCr of 1.02 mg/dL (H)).   Medical History: Past Medical History:  Diagnosis Date   Anxiety    Depression    Hypertension    Kidney stones    Mitral valve prolapse     Medications:  No history of chronic anticoagulant use PTA  Assessment: Pharmacy has been consulted to initiate and monitor heparin in 65yo patient admitted with right foot pain and discoloration. CT results positive for mural thrombus/plaque and occlusions in the posterior tibial artery and anterior tibial artery bilaterally(likely related to embolic disease). Baseline labs have been ordered and are pending.  Goal of Therapy:  Heparin level 0.3-0.7 units/ml Monitor platelets by anticoagulation protocol: Yes   Plan:  Give 4500 units bolus x 1 Start heparin infusion at 1100 units/hr Check anti-Xa level in 6 hours and daily while on heparin Continue to monitor H&H and platelets  Rachel Hahn A Rachel Hahn 06/03/2022,2:47 PM

## 2022-06-04 ENCOUNTER — Inpatient Hospital Stay: Payer: 59

## 2022-06-04 DIAGNOSIS — I779 Disorder of arteries and arterioles, unspecified: Secondary | ICD-10-CM

## 2022-06-04 LAB — CBC
HCT: 42.5 % (ref 36.0–46.0)
Hemoglobin: 13.2 g/dL (ref 12.0–15.0)
MCH: 29.3 pg (ref 26.0–34.0)
MCHC: 31.1 g/dL (ref 30.0–36.0)
MCV: 94.4 fL (ref 80.0–100.0)
Platelets: 178 10*3/uL (ref 150–400)
RBC: 4.5 MIL/uL (ref 3.87–5.11)
RDW: 14.5 % (ref 11.5–15.5)
WBC: 10.5 10*3/uL (ref 4.0–10.5)
nRBC: 0 % (ref 0.0–0.2)

## 2022-06-04 LAB — HIV ANTIBODY (ROUTINE TESTING W REFLEX): HIV Screen 4th Generation wRfx: NONREACTIVE

## 2022-06-04 LAB — NM MYOCAR MULTI W/SPECT W/WALL MOTION / EF
Base ST Depression (mm): 0 mm
Estimated workload: 1
Exercise duration (min): 1 min
Exercise duration (sec): 10 s
LV dias vol: 53 mL (ref 46–106)
LV sys vol: 19 mL
MPHR: 156 {beats}/min
Nuc Stress EF: 64 %
Peak HR: 103 {beats}/min
Percent HR: 66 %
Rest HR: 80 {beats}/min
Rest Nuclear Isotope Dose: 10.5 mCi
SDS: 1
SRS: 9
SSS: 5
ST Depression (mm): 0 mm
Stress Nuclear Isotope Dose: 31.8 mCi
TID: 0.9

## 2022-06-04 LAB — BASIC METABOLIC PANEL
Anion gap: 7 (ref 5–15)
BUN: 21 mg/dL (ref 8–23)
CO2: 26 mmol/L (ref 22–32)
Calcium: 8.9 mg/dL (ref 8.9–10.3)
Chloride: 107 mmol/L (ref 98–111)
Creatinine, Ser: 1.15 mg/dL — ABNORMAL HIGH (ref 0.44–1.00)
GFR, Estimated: 53 mL/min — ABNORMAL LOW (ref >60)
Glucose, Bld: 98 mg/dL (ref 70–99)
Potassium: 3.7 mmol/L (ref 3.5–5.1)
Sodium: 140 mmol/L (ref 135–145)

## 2022-06-04 LAB — HEPARIN LEVEL (UNFRACTIONATED)
Heparin Unfractionated: 0.55 IU/mL (ref 0.30–0.70)
Heparin Unfractionated: 0.48 IU/mL (ref 0.30–0.70)

## 2022-06-04 MED ORDER — REGADENOSON 0.4 MG/5ML IV SOLN
0.4000 mg | Freq: Once | INTRAVENOUS | Status: AC
Start: 2022-06-04 — End: 2022-06-04
  Administered 2022-06-04: 0.4 mg via INTRAVENOUS
  Filled 2022-06-04: qty 5

## 2022-06-04 MED ORDER — TECHNETIUM TC 99M TETROFOSMIN IV KIT
10.0000 | PACK | Freq: Once | INTRAVENOUS | Status: AC
  Administered 2022-06-04: 10.46 via INTRAVENOUS

## 2022-06-04 MED ORDER — PNEUMOCOCCAL 20-VAL CONJ VACC 0.5 ML IM SUSY
0.5000 mL | PREFILLED_SYRINGE | INTRAMUSCULAR | Status: DC
  Filled 2022-06-04: qty 0.5

## 2022-06-04 MED ORDER — ROSUVASTATIN CALCIUM 10 MG PO TABS
40.0000 mg | ORAL_TABLET | Freq: Every day | ORAL | Status: DC
  Administered 2022-06-05 – 2022-06-09 (×5): 40 mg via ORAL
  Filled 2022-06-04 (×4): qty 4
  Filled 2022-06-04: qty 2
  Filled 2022-06-04: qty 4

## 2022-06-04 MED ORDER — LOSARTAN POTASSIUM 50 MG PO TABS
50.0000 mg | ORAL_TABLET | Freq: Every day | ORAL | Status: DC
  Administered 2022-06-05 – 2022-06-09 (×4): 50 mg via ORAL
  Filled 2022-06-04 (×4): qty 1

## 2022-06-04 MED ORDER — TECHNETIUM TC 99M TETROFOSMIN IV KIT
30.0000 | PACK | Freq: Once | INTRAVENOUS | Status: AC
  Administered 2022-06-04: 31.76 via INTRAVENOUS

## 2022-06-04 NOTE — ED Notes (Signed)
Report to tara, rn.  

## 2022-06-04 NOTE — ED Notes (Signed)
Informed RN bed assigned 

## 2022-06-04 NOTE — Consult Note (Signed)
ANTICOAGULATION CONSULT NOTE  Pharmacy Consult for Heparin infusion Indication: DVT  Allergies  Allergen Reactions   Nitrofurantoin Hives   Nsaids Shortness Of Breath   Codeine Nausea And Vomiting   Ibuprofen Other (See Comments)    Shortness of breath/chest pain   Morphine And Related Nausea And Vomiting   Prednisone Other (See Comments)    Makes her feel "woot-woot", she can tolerate Symbicort.   Sudafed [Pseudoephedrine Hcl] Other (See Comments)    Chest pain/shortness of breath    Patient Measurements: Height: 5\' 1"  (154.9 cm) Weight: 78.5 kg (173 lb) IBW/kg (Calculated) : 47.8 Heparin Dosing Weight: 65.4 kg  Vital Signs: Temp: 98.7 F (37.1 C) (06/16 2346) Temp Source: Oral (06/16 2346) BP: 149/66 (06/17 0900) Pulse Rate: 71 (06/17 0900)  Labs: Recent Labs    06/03/22 1013 06/03/22 1513 06/03/22 2052 06/04/22 0316 06/04/22 0923  HGB 14.1  --   --  13.2  --   HCT 45.2  --   --  42.5  --   PLT 214  --   --  178  --   APTT  --  26  --   --   --   LABPROT  --  12.3  --   --   --   INR  --  0.9  --   --   --   HEPARINUNFRC  --   --  0.84* 0.55 0.48  CREATININE 1.02*  --   --  1.15*  --      Estimated Creatinine Clearance: 46.9 mL/min (A) (by C-G formula based on SCr of 1.15 mg/dL (H)).   Medical History: Past Medical History:  Diagnosis Date   Anxiety    Depression    Hypertension    Kidney stones    Mitral valve prolapse     Medications:  No history of chronic anticoagulant use PTA  Assessment: Pharmacy has been consulted to initiate and monitor heparin in 65yo patient admitted with right foot pain and discoloration. CT results positive for mural thrombus/plaque and occlusions in the posterior tibial artery and anterior tibial artery bilaterally(likely related to embolic disease). Baseline labs have been ordered and are pending.  Goal of Therapy:  Heparin level 0.3-0.7 units/ml Monitor platelets by anticoagulation protocol:  Yes  Date Time HL Rate/Comment 6/16 2052 0.84 Supratherapeutic/Decrease 1100 > 950 u/hr 6/17 0316 0.55 Therapeutic x 1 6/17 0923 0.48 Therapeutic  Plan:  Heparin level is therapeutic. Will continue heparin infusion at 950 units/hr. Recheck heparin level and CBC with AM labs.    7/17 PharmD, 06/04/2022 9:59 AM

## 2022-06-04 NOTE — ED Notes (Signed)
Nuc Med called and advised pt is nauseated and needs something for nausea.

## 2022-06-04 NOTE — Consult Note (Signed)
CARDIOLOGY CONSULT NOTE               Patient ID: Rachel Hahn MRN: [id number] DOB/AGE: [birth year] 66 y.o.  Admit date: 06/03/2022 Referring Physician Dr. Elmer Ramp [facility] Primary Cardiologist Dr Jolene Provost cardiology Reason for Consultation preoperative clearance for major peripheral vascular disease procedure  HPI: Patient is a 66 year old white female with multiple risk factors including age hypertension smoking obesity peripheral vascular disease who presents with critical limb ischemia involving her fifth toe on the right leg.  Patient complains of discoloration over the last few days with pain.  Patient has a long extensive history of smoking underwent imaging with a CTA of the aorta which showed large mural thrombus which is worse since 2018 reportedly has minimal flow at the origin of the IMA and evidence of significant vascular disease patient is preop for vascular intervention and cardiac clearance is requested to restratify and identify surgical cardiac risk.  Patient denies any cardiac symptoms shortness of breath chest pain palpitations or tachycardia  Review of systems complete and found to be negative unless listed above     Past Medical History:  Diagnosis Date   Anxiety    Depression    Hypertension    Kidney stones    Mitral valve prolapse     Past Surgical History:  Procedure Laterality Date   KIDNEY CYST REMOVAL     LITHOTRIPSY     TONSILLECTOMY      (Not in a hospital admission)  Social History   Socioeconomic History   Marital status: Single    Spouse name: Not on file   Number of children: Not on file   Years of education: Not on file   Highest education level: Not on file  Occupational History   Not on file  Tobacco Use   Smoking status: Every Day    Packs/day: 1.00    Years: 20.00    Total pack years: 20.00    Types: Cigarettes   Smokeless tobacco: Never  Substance and Sexual  Activity   Alcohol use: No   Drug use: No   Sexual activity: Not Currently  Other Topics Concern   Not on file  Social History Narrative   Not on file   Social Determinants of Health   Financial Resource Strain: Not on file  Food Insecurity: Not on file  Transportation Needs: Not on file  Physical Activity: Not on file  Stress: Not on file  Social Connections: Not on file  Intimate Partner Violence: Not on file    Family History  Problem Relation Age of Onset   Hypertension Other       Review of systems complete and found to be negative unless listed above      PHYSICAL EXAM  General: Well developed, well nourished, in no acute distress HEENT:  Normocephalic and atramatic Neck:  No JVD.  Lungs: Clear bilaterally to auscultation and percussion. Heart: HRRR . Normal S1 and S2 without gallops or murmurs.  Abdomen: Bowel sounds are positive, abdomen soft and non-tender  Msk:  Back normal, normal gait. Normal strength and tone for age. Extremities: No clubbing, cyanosis or edema.   Neuro: Alert and oriented X 3. Psych:  Good affect, responds appropriately  Labs:   Lab Results  Component Value Date   WBC 10.5 06/04/2022   HGB 13.2 06/04/2022   HCT 42.5 06/04/2022   MCV 94.4 06/04/2022   PLT 178 06/04/2022  Recent Labs  Lab 06/03/22 1013 06/04/22 0316  NA 139 140  K 4.4 3.7  CL 106 107  CO2 26 26  BUN 23 21  CREATININE 1.02* 1.15*  CALCIUM 9.5 8.9  PROT 7.2  --   BILITOT 0.5  --   ALKPHOS 85  --   ALT 17  --   AST 17  --   GLUCOSE 114* 98   Lab Results  Component Value Date   TROPONINI <0.03 05/18/2017   No results found for: "CHOL" No results found for: "HDL" No results found for: "LDLCALC" No results found for: "TRIG" No results found for: "CHOLHDL" No results found for: "LDLDIRECT"    Radiology: CT ANGIO AO+BIFEM W & OR WO CONTRAST  Result Date: 06/03/2022 CLINICAL DATA:  Discoloration in the feet. EXAM: CT ANGIOGRAPHY OF ABDOMINAL  AORTA WITH ILIOFEMORAL RUNOFF TECHNIQUE: Multidetector CT imaging of the abdomen, pelvis and lower extremities was performed using the standard protocol during bolus administration of intravenous contrast. Multiplanar CT image reconstructions and MIPs were obtained to evaluate the vascular anatomy. RADIATION DOSE REDUCTION: This exam was performed according to the departmental dose-optimization program which includes automated exposure control, adjustment of the mA and/or kV according to patient size and/or use of iterative reconstruction technique. CONTRAST:  OMNIPAQUE IOHEXOL 350 MG/ML SOLN COMPARISON:  CTA chest, abdomen and pelvis 05/17/2017 FINDINGS: VASCULAR Aorta: Small amount of atherosclerotic disease involving the distal descending thoracic aorta with normal caliber. The proximal abdominal aorta is widely patent with minimal atherosclerotic disease and no aneurysm. There is a large amount mural thrombus involving the infrarenal abdominal aorta with near occlusion on sequence 4, image 75. The amount of mural thrombus and degree of stenosis in the infrarenal abdominal aorta has markedly increased since 2018. Negative for an aortic aneurysm or dissection. Celiac: Patent without evidence of aneurysm, dissection, vasculitis or significant stenosis. SMA: Patent without evidence of aneurysm, dissection, vasculitis or significant stenosis. Renals: Both renal arteries are patent without evidence of aneurysm, dissection, vasculitis, fibromuscular dysplasia or significant stenosis. IMA: Minimal flow near the origin probably related to the aortic plaque. There is flow in the IMA and suspect that large portion of the flow is supplied from SMA collateral flow. RIGHT Lower Extremity Inflow: Mild narrowing near the origin related to noncalcified plaque. Otherwise, the right common iliac artery is patent. Right internal and right external iliac arteries are widely patent. Outflow: Common, superficial and profunda  femoral arteries and the popliteal artery are patent without evidence of aneurysm, dissection, vasculitis or significant stenosis. Runoff: Proximal runoff vessels are patent and the peroneal artery is patent to the ankle. Posterior tibial artery occludes in the mid calf and there is some distal reconstitution at the ankle with flow in the plantar branches. Anterior tibial artery occludes in the mid/distal calf. No significant flow in the dorsalis pedis artery. LEFT Lower Extremity Inflow: Common, internal and external iliac arteries are patent without evidence of aneurysm, dissection, vasculitis or significant stenosis. Outflow: Common, superficial and profunda femoral arteries and the popliteal artery are patent without evidence of aneurysm, dissection, vasculitis or significant stenosis. Runoff: No significant flow in the posterior tibial artery. Peroneal artery is patent down to the ankle and there is some reconstitution of the distal posterior tibial artery and plantar arteries. The anterior tibial artery occludes in the distal calf and there is no significant flow in the dorsalis pedis artery. Veins: No obvious venous abnormality within the limitations of this arterial phase study. Review of the  MIP images confirms the above findings. NON-VASCULAR Lower chest: Volume loss at both lung bases. No large pleural effusions. Hepatobiliary: Again noted is a small low-density structure in the anterior liver that measures roughly 5 mm and could represent a cyst. Otherwise, stable appearance of the liver without acute findings. No acute abnormality to the gallbladder. No significant biliary dilatation. Pancreas: Unremarkable. No pancreatic ductal dilatation or surrounding inflammatory changes. Spleen: Normal in size without focal abnormality. Adrenals/Urinary Tract: 1.6 cm left adrenal nodule with Hounsfield units measuring -13 and this is most compatible with a left adrenal adenoma. Normal appearance of the right  adrenal gland. Bilateral renal atrophy with probable bilateral cortical scarring. Again noted are multiple right renal calculi and largest measures 6 mm in the right kidney lower pole. There is no significant right hydronephrosis. No evidence for right ureter stones. There is at least 1 cortical cyst in the right kidney. Again noted are parapelvic and cortical left renal cysts. Mild-moderate dilatation of left renal calices with at least 3 stones in left renal pelvis and the largest measures 6 mm. Multiple left renal calculi. No significant dilatation of the left ureter. No perinephric edema or stranding. Normal appearance of the urinary bladder. Stomach/Bowel: Scattered colonic diverticula without acute bowel inflammation. Normal appearance of the stomach. No evidence for bowel dilatation or obstruction. Normal appendix. Lymphatic: No abdominal or pelvic lymph node enlargement. Index left periaortic lymph node measures 8 mm in the short axis on sequence 4 image 65. Reproductive: Uterus and bilateral adnexa are unremarkable. Other: Small inguinal hernias containing fat. Tiny umbilical hernia containing fat. Negative for free fluid. Negative for free air. Musculoskeletal: No acute bone abnormality. Subcutaneous edema in the calves. IMPRESSION: VASCULAR 1. 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 The amount of mural thrombus in the abdominal aorta has markedly increased since 2018. This aortic mural thrombus is likely the source for embolic disease. 2. Bilateral runoff disease. Occlusions in the posterior tibial artery and anterior tibial artery bilaterally are likely related to embolic disease. Primary runoff vessel is the peroneal artery bilaterally. Reconstitution of the distal posterior tibial arteries bilaterally. 3. No significant outflow disease. 4. Minimal flow at the origin of the IMA related to the aortic disease but there is distal reconstitution.  The other main visceral arteries are widely patent. NON-VASCULAR 1. Bilateral renal calculi with atrophy and cortical scarring in both kidneys. Mild-to-moderate left hydronephrosis due to stones at the left renal pelvis. Left hydronephrosis has slightly increased since 2018. Bilateral renal cysts. 2. Left adrenal adenoma. 3. Small umbilical hernia and small bilateral inguinal hernias. Electronically Signed   By: Richarda Overlie M.D.   On: 06/03/2022 13:29    EKG: Sinus tachycardia nonspecific ST-T wave changes  ASSESSMENT AND PLAN:  Peripheral vascular disease Ischemic toe Preop for vascular procedure Smoking by history Mild obesity Chronic renal insufficiency GERD Chronic pain . Plan Early ischemia critical limb right fifth toe Preop for vascular procedure with Dr. Wyn Quaker Continue anticoagulation with IV heparin Advised patient to refrain from tobacco abuse Continue PPI therapy omeprazole Protonix Recommend moderate to high-dose statin therapy Aspirin therapy for anticoagulation and arteriosclerotic vascular disease Recommend noninvasive cardiac evaluation including functional study probably with a Lexiscan Myoview as she is not a good ambulatory candidate Consider echocardiogram at some point for left ventricular function wall motion Patient has a significantly high cardiac risk profile but no clear evidence of cardiac symptoms at this point  Signed: Zaylie Gisler D Chloey Ricard  MD 06/04/2022, 8:19 AM

## 2022-06-04 NOTE — ED Notes (Signed)
Pt moved to hospital bed for comfort.

## 2022-06-04 NOTE — ED Notes (Signed)
Pt tx to Nuc Med. 

## 2022-06-04 NOTE — ED Notes (Signed)
ED TO INPATIENT HANDOFF REPORT  ED Nurse Name and Phone #: Delice Bison, RN  S Name/Age/Gender Rachel Hahn 66 y.o. female Room/Bed: ED04A/ED04A  Code Status   Code Status: Full Code  Home/SNF/Other Home Patient oriented to: self, place, time, and situation Is this baseline? Yes   Triage Complete: Triage complete  Chief Complaint Peripheral arterial occlusive disease (HCC) [I77.9]  Triage Note Patient presents with right foot, right pinky discoloration. Seen urgent care X2 days ago and they gave her a ortho shoe to wear and follow up with vascular. Patient reports the toe discoloration has gotten worse and spread up foot. Patient also has discoloration starting on left toes   Allergies Allergies  Allergen Reactions   Nitrofurantoin Hives   Nsaids Shortness Of Breath   Codeine Nausea And Vomiting   Ibuprofen Other (See Comments)    Shortness of breath/chest pain   Morphine And Related Nausea And Vomiting   Prednisone Other (See Comments)    Makes her feel "woot-woot", she can tolerate Symbicort.   Sudafed [Pseudoephedrine Hcl] Other (See Comments)    Chest pain/shortness of breath    Level of Care/Admitting Diagnosis ED Disposition     ED Disposition  Admit   Condition  --   Comment  Hospital Area: [facility] [[id number]]  Level of Care: Progressive [102]  Admit to Progressive based on following criteria: CARDIOVASCULAR & THORACIC of moderate stability with acute coronary syndrome symptoms/low risk myocardial infarction/hypertensive urgency/arrhythmias/heart failure potentially compromising stability and stable post cardiovascular intervention patients.  Covid Evaluation: Asymptomatic - no recent exposure (last 10 days) testing not required  Diagnosis: Peripheral arterial occlusive disease (HCC) [161096]  Admitting Physician: Lovenia Kim [[id number]]  Attending Physician: COX, AMY N [[id number]]  Estimated length of stay: past midnight tomorrow   Certification:: I certify this patient will need inpatient services for at least 2 midnights          B Medical/Surgery History Past Medical History:  Diagnosis Date   Anxiety    Depression    Hypertension    Kidney stones    Mitral valve prolapse    Past Surgical History:  Procedure Laterality Date   KIDNEY CYST REMOVAL     LITHOTRIPSY     TONSILLECTOMY       A IV Location/Drains/Wounds Patient Lines/Drains/Airways Status     Active Line/Drains/Airways     Name Placement date Placement time Site Days   Peripheral IV 06/03/22 20 G Left Antecubital 06/03/22  1148  Antecubital  1   Peripheral IV 06/03/22 22 G Anterior;Right Hand 06/03/22  1513  Hand  1            Intake/Output Last 24 hours No intake or output data in the 24 hours ending 06/04/22 1435  Labs/Imaging Results for orders placed or performed during the hospital encounter of 06/03/22 (from the past 48 hour(s))  CBC with Differential     Status: None   Collection Time: 06/03/22 10:13 AM  Result Value Ref Range   WBC 10.2 4.0 - 10.5 K/uL   RBC 4.78 3.87 - 5.11 MIL/uL   Hemoglobin 14.1 12.0 - 15.0 g/dL   HCT 29.5 62.1 - 30.8 %   MCV 94.6 80.0 - 100.0 fL   MCH 29.5 26.0 - 34.0 pg   MCHC 31.2 30.0 - 36.0 g/dL   RDW 65.7 84.6 - 96.2 %   Platelets 214 150 - 400 K/uL   nRBC 0.0 0.0 - 0.2 %   Neutrophils Relative %  66 %   Neutro Abs 6.7 1.7 - 7.7 K/uL   Lymphocytes Relative 26 %   Lymphs Abs 2.7 0.7 - 4.0 K/uL   Monocytes Relative 6 %   Monocytes Absolute 0.6 0.1 - 1.0 K/uL   Eosinophils Relative 2 %   Eosinophils Absolute 0.2 0.0 - 0.5 K/uL   Basophils Relative 0 %   Basophils Absolute 0.0 0.0 - 0.1 K/uL   Immature Granulocytes 0 %   Abs Immature Granulocytes 0.03 0.00 - 0.07 K/uL    Comment: Performed at [facility], [address] [place], [place] [address]  Comprehensive metabolic panel     Status: Abnormal   Collection Time: 06/03/22 10:13 AM  Result Value Ref Range   Sodium  139 135 - 145 mmol/L   Potassium 4.4 3.5 - 5.1 mmol/L   Chloride 106 98 - 111 mmol/L   CO2 26 22 - 32 mmol/L   Glucose, Bld 114 (H) 70 - 99 mg/dL    Comment: Glucose reference range applies only to samples taken after fasting for at least 8 hours.   BUN 23 8 - 23 mg/dL   Creatinine, Ser 2.35 (H) 0.44 - 1.00 mg/dL   Calcium 9.5 8.9 - 57.3 mg/dL   Total Protein 7.2 6.5 - 8.1 g/dL   Albumin 3.9 3.5 - 5.0 g/dL   AST 17 15 - 41 U/L   ALT 17 0 - 44 U/L   Alkaline Phosphatase 85 38 - 126 U/L   Total Bilirubin 0.5 0.3 - 1.2 mg/dL   GFR, Estimated >22 >02 mL/min    Comment: (NOTE) Calculated using the CKD-EPI Creatinine Equation (2021)    Anion gap 7 5 - 15    Comment: Performed at [facility], [address] [place], [place] [address]  APTT     Status: None   Collection Time: 06/03/22  3:13 PM  Result Value Ref Range   aPTT 26 24 - 36 seconds    Comment: Performed at [facility], [address] [place], [place] [address]  Protime-INR     Status: None   Collection Time: 06/03/22  3:13 PM  Result Value Ref Range   Prothrombin Time 12.3 11.4 - 15.2 seconds   INR 0.9 0.8 - 1.2    Comment: (NOTE) INR goal varies based on device and disease states. Performed at [facility], [address] [place], [place] [address]   Heparin level (unfractionated)     Status: Abnormal   Collection Time: 06/03/22  8:52 PM  Result Value Ref Range   Heparin Unfractionated 0.84 (H) 0.30 - 0.70 IU/mL    Comment: (NOTE) The clinical reportable range upper limit is being lowered to >1.10 to align with the FDA approved guidance for the current laboratory assay.  If heparin results are below expected values, and patient dosage has  been confirmed, suggest follow up testing of antithrombin III levels. Performed at [facility], [address] [place], [place] [address]   Basic metabolic panel     Status: Abnormal   Collection Time: 06/04/22  3:16 AM  Result  Value Ref Range   Sodium 140 135 - 145 mmol/L   Potassium 3.7 3.5 - 5.1 mmol/L   Chloride 107 98 - 111 mmol/L   CO2 26 22 - 32 mmol/L   Glucose, Bld 98 70 - 99 mg/dL    Comment: Glucose reference range applies only to samples taken after fasting for at least 8 hours.  BUN 21 8 - 23 mg/dL   Creatinine, Ser 9.16 (H) 0.44 - 1.00 mg/dL   Calcium 8.9 8.9 - 94.5 mg/dL   GFR, Estimated 53 (L) >60 mL/min    Comment: (NOTE) Calculated using the CKD-EPI Creatinine Equation (2021)    Anion gap 7 5 - 15    Comment: Performed at [facility], [address] [place], [place] [address]  CBC     Status: None   Collection Time: 06/04/22  3:16 AM  Result Value Ref Range   WBC 10.5 4.0 - 10.5 K/uL   RBC 4.50 3.87 - 5.11 MIL/uL   Hemoglobin 13.2 12.0 - 15.0 g/dL   HCT 28.0 03.4 - 91.7 %   MCV 94.4 80.0 - 100.0 fL   MCH 29.3 26.0 - 34.0 pg   MCHC 31.1 30.0 - 36.0 g/dL   RDW 91.5 05.6 - 97.9 %   Platelets 178 150 - 400 K/uL   nRBC 0.0 0.0 - 0.2 %    Comment: Performed at [facility], [address] [place], [place] [address]  HIV Antibody (routine testing w rflx)     Status: None   Collection Time: 06/04/22  3:16 AM  Result Value Ref Range   HIV Screen 4th Generation wRfx Non Reactive Non Reactive    Comment: Performed at [facility], [address], [place], [place] [address]  Heparin level (unfractionated)     Status: None   Collection Time: 06/04/22  3:16 AM  Result Value Ref Range   Heparin Unfractionated 0.55 0.30 - 0.70 IU/mL    Comment: (NOTE) The clinical reportable range upper limit is being lowered to >1.10 to align with the FDA approved guidance for the current laboratory assay.  If heparin results are below expected values, and patient dosage has  been confirmed, suggest follow up testing of antithrombin III levels. Performed at [facility], [address] [place], [place] [address]   Heparin level (unfractionated)     Status: None    Collection Time: 06/04/22  9:23 AM  Result Value Ref Range   Heparin Unfractionated 0.48 0.30 - 0.70 IU/mL    Comment: (NOTE) The clinical reportable range upper limit is being lowered to >1.10 to align with the FDA approved guidance for the current laboratory assay.  If heparin results are below expected values, and patient dosage has  been confirmed, suggest follow up testing of antithrombin III levels. Performed at [facility], [address] [place], [place] [address]    CT ANGIO Andree Coss & OR WO CONTRAST  Result Date: 06/03/2022 CLINICAL DATA:  Discoloration in the feet. EXAM: CT ANGIOGRAPHY OF ABDOMINAL AORTA WITH ILIOFEMORAL RUNOFF TECHNIQUE: Multidetector CT imaging of the abdomen, pelvis and lower extremities was performed using the standard protocol during bolus administration of intravenous contrast. Multiplanar CT image reconstructions and MIPs were obtained to evaluate the vascular anatomy. RADIATION DOSE REDUCTION: This exam was performed according to the departmental dose-optimization program which includes automated exposure control, adjustment of the mA and/or kV according to patient size and/or use of iterative reconstruction technique. CONTRAST:  OMNIPAQUE IOHEXOL 350 MG/ML SOLN COMPARISON:  CTA chest, abdomen and pelvis 05/17/2017 FINDINGS: VASCULAR Aorta: Small amount of atherosclerotic disease involving the distal descending thoracic aorta with normal caliber. The proximal abdominal aorta is widely patent with minimal atherosclerotic disease and no aneurysm. There is a large amount mural thrombus involving the infrarenal abdominal aorta with near occlusion on sequence 4, image 75.  The amount of mural thrombus and degree of stenosis in the infrarenal abdominal aorta has markedly increased since 2018. Negative for an aortic aneurysm or dissection. Celiac: Patent without evidence of aneurysm, dissection, vasculitis or significant stenosis. SMA: Patent without  evidence of aneurysm, dissection, vasculitis or significant stenosis. Renals: Both renal arteries are patent without evidence of aneurysm, dissection, vasculitis, fibromuscular dysplasia or significant stenosis. IMA: Minimal flow near the origin probably related to the aortic plaque. There is flow in the IMA and suspect that large portion of the flow is supplied from SMA collateral flow. RIGHT Lower Extremity Inflow: Mild narrowing near the origin related to noncalcified plaque. Otherwise, the right common iliac artery is patent. Right internal and right external iliac arteries are widely patent. Outflow: Common, superficial and profunda femoral arteries and the popliteal artery are patent without evidence of aneurysm, dissection, vasculitis or significant stenosis. Runoff: Proximal runoff vessels are patent and the peroneal artery is patent to the ankle. Posterior tibial artery occludes in the mid calf and there is some distal reconstitution at the ankle with flow in the plantar branches. Anterior tibial artery occludes in the mid/distal calf. No significant flow in the dorsalis pedis artery. LEFT Lower Extremity Inflow: Common, internal and external iliac arteries are patent without evidence of aneurysm, dissection, vasculitis or significant stenosis. Outflow: Common, superficial and profunda femoral arteries and the popliteal artery are patent without evidence of aneurysm, dissection, vasculitis or significant stenosis. Runoff: No significant flow in the posterior tibial artery. Peroneal artery is patent down to the ankle and there is some reconstitution of the distal posterior tibial artery and plantar arteries. The anterior tibial artery occludes in the distal calf and there is no significant flow in the dorsalis pedis artery. Veins: No obvious venous abnormality within the limitations of this arterial phase study. Review of the MIP images confirms the above findings. NON-VASCULAR Lower chest: Volume loss at  both lung bases. No large pleural effusions. Hepatobiliary: Again noted is a small low-density structure in the anterior liver that measures roughly 5 mm and could represent a cyst. Otherwise, stable appearance of the liver without acute findings. No acute abnormality to the gallbladder. No significant biliary dilatation. Pancreas: Unremarkable. No pancreatic ductal dilatation or surrounding inflammatory changes. Spleen: Normal in size without focal abnormality. Adrenals/Urinary Tract: 1.6 cm left adrenal nodule with Hounsfield units measuring -13 and this is most compatible with a left adrenal adenoma. Normal appearance of the right adrenal gland. Bilateral renal atrophy with probable bilateral cortical scarring. Again noted are multiple right renal calculi and largest measures 6 mm in the right kidney lower pole. There is no significant right hydronephrosis. No evidence for right ureter stones. There is at least 1 cortical cyst in the right kidney. Again noted are parapelvic and cortical left renal cysts. Mild-moderate dilatation of left renal calices with at least 3 stones in left renal pelvis and the largest measures 6 mm. Multiple left renal calculi. No significant dilatation of the left ureter. No perinephric edema or stranding. Normal appearance of the urinary bladder. Stomach/Bowel: Scattered colonic diverticula without acute bowel inflammation. Normal appearance of the stomach. No evidence for bowel dilatation or obstruction. Normal appendix. Lymphatic: No abdominal or pelvic lymph node enlargement. Index left periaortic lymph node measures 8 mm in the short axis on sequence 4 image 65. Reproductive: Uterus and bilateral adnexa are unremarkable. Other: Small inguinal hernias containing fat. Tiny umbilical hernia containing fat. Negative for free fluid. Negative for free air. Musculoskeletal: No acute bone  abnormality. Subcutaneous edema in the calves. IMPRESSION: VASCULAR 1. 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 The amount of mural thrombus in the abdominal aorta has markedly increased since 2018. This aortic mural thrombus is likely the source for embolic disease. 2. Bilateral runoff disease. Occlusions in the posterior tibial artery and anterior tibial artery bilaterally are likely related to embolic disease. Primary runoff vessel is the peroneal artery bilaterally. Reconstitution of the distal posterior tibial arteries bilaterally. 3. No significant outflow disease. 4. Minimal flow at the origin of the IMA related to the aortic disease but there is distal reconstitution. The other main visceral arteries are widely patent. NON-VASCULAR 1. Bilateral renal calculi with atrophy and cortical scarring in both kidneys. Mild-to-moderate left hydronephrosis due to stones at the left renal pelvis. Left hydronephrosis has slightly increased since 2018. Bilateral renal cysts. 2. Left adrenal adenoma. 3. Small umbilical hernia and small bilateral inguinal hernias. Electronically Signed   By: Richarda Overlie M.D.   On: 06/03/2022 13:29    Pending Labs Unresulted Labs (From admission, onward)     Start     Ordered   06/05/22 0500  Heparin level (unfractionated)  Tomorrow morning,   R        06/04/22 1001   06/05/22 0500  CBC  Tomorrow morning,   R        06/04/22 1001            Vitals/Pain Today's Vitals   06/04/22 0800 06/04/22 0900 06/04/22 1100 06/04/22 1300  BP: (!) 143/75 (!) 149/66 (!) 155/77 (!) 147/73  Pulse: 73 71 78 82  Resp: 11 16 16 18   Temp:      TempSrc:      SpO2: 97% 98% 93% 95%  Weight:      Height:      PainSc:        Isolation Precautions No active isolations  Medications Medications  acetaminophen (TYLENOL) tablet 650 mg (has no administration in time range)    Or  acetaminophen (TYLENOL) suppository 650 mg (has no administration in time range)  ondansetron (ZOFRAN) tablet 4 mg ( Oral See Alternative  06/04/22 1220)    Or  ondansetron (ZOFRAN) injection 4 mg (4 mg Intravenous Given 06/04/22 1220)  senna-docusate (Senokot-S) tablet 1 tablet (has no administration in time range)  polyethylene glycol (MIRALAX / GLYCOLAX) packet 17 g (has no administration in time range)  heparin ADULT infusion 100 units/mL (25000 units/228mL) (950 Units/hr Intravenous New Bag/Given 06/04/22 1220)  oxyCODONE (Oxy IR/ROXICODONE) immediate release tablet 5 mg (has no administration in time range)  morphine (PF) 2 MG/ML injection 2 mg (has no administration in time range)  nicotine (NICODERM CQ - dosed in mg/24 hours) patch 21 mg (has no administration in time range)  atenolol (TENORMIN) tablet 100 mg (0 mg Oral Hold 06/04/22 0906)  sertraline (ZOLOFT) tablet 100 mg (0 mg Oral Hold 06/04/22 0906)  pantoprazole (PROTONIX) EC tablet 40 mg (0 mg Oral Hold 06/04/22 0906)  mometasone-formoterol (DULERA) 200-5 MCG/ACT inhaler 2 puff (2 puffs Inhalation Given 06/04/22 0729)  lidocaine (LIDODERM) 5 % 1 patch (has no administration in time range)  melatonin tablet 5 mg (has no administration in time range)  losartan (COZAAR) tablet 50 mg (0 mg Oral Hold 06/04/22 0907)  rosuvastatin (CRESTOR) tablet 40 mg (0 mg Oral Hold 06/04/22 0907)  oxyCODONE (Oxy IR/ROXICODONE) immediate release tablet 5 mg (5 mg Oral Given 06/03/22 1207)  iohexol (OMNIPAQUE) 350  MG/ML injection 125 mL (125 mLs Intravenous Contrast Given 06/03/22 1227)  heparin bolus via infusion 4,500 Units (4,500 Units Intravenous Bolus from Bag 06/03/22 1517)  technetium tetrofosmin (TC-MYOVIEW) injection 10 millicurie (10.46 millicuries Intravenous Contrast Given 06/04/22 1008)  technetium tetrofosmin (TC-MYOVIEW) injection 30 millicurie (31.76 millicuries Intravenous Contrast Given 06/04/22 1127)  regadenoson (LEXISCAN) injection SOLN 0.4 mg (0.4 mg Intravenous Given 06/04/22 1127)    Mobility walks Low fall risk   Focused Assessments Cardiac Assessment  Handoff:  Cardiac Rhythm: Normal sinus rhythm Lab Results  Component Value Date   TROPONINI <0.03 05/18/2017   No results found for: "DDIMER" Does the Patient currently have chest pain? No    R Recommendations: See Admitting Provider Note  Report given to:   Additional Notes:

## 2022-06-04 NOTE — Consult Note (Signed)
ANTICOAGULATION CONSULT NOTE  Pharmacy Consult for Heparin infusion Indication: DVT  Allergies  Allergen Reactions   Nitrofurantoin Hives   Nsaids Shortness Of Breath   Codeine Nausea And Vomiting   Ibuprofen Other (See Comments)    Shortness of breath/chest pain   Morphine And Related Nausea And Vomiting   Prednisone Other (See Comments)    Makes her feel "woot-woot", she can tolerate Symbicort.   Sudafed [Pseudoephedrine Hcl] Other (See Comments)    Chest pain/shortness of breath    Patient Measurements: Height: 5\' 1"  (154.9 cm) Weight: 78.5 kg (173 lb) IBW/kg (Calculated) : 47.8 Heparin Dosing Weight: 65.4 kg  Vital Signs: Temp: 98.7 F (37.1 C) (06/16 2346) Temp Source: Oral (06/16 2346) BP: 121/69 (06/16 2346) Pulse Rate: 70 (06/16 2346)  Labs: Recent Labs    06/03/22 1013 06/03/22 1513 06/03/22 2052 06/04/22 0316  HGB 14.1  --   --  13.2  HCT 45.2  --   --  42.5  PLT 214  --   --  178  APTT  --  26  --   --   LABPROT  --  12.3  --   --   INR  --  0.9  --   --   HEPARINUNFRC  --   --  0.84* 0.55  CREATININE 1.02*  --   --  1.15*     Estimated Creatinine Clearance: 46.9 mL/min (A) (by C-G formula based on SCr of 1.15 mg/dL (H)).   Medical History: Past Medical History:  Diagnosis Date   Anxiety    Depression    Hypertension    Kidney stones    Mitral valve prolapse     Medications:  No history of chronic anticoagulant use PTA  Assessment: Pharmacy has been consulted to initiate and monitor heparin in 65yo patient admitted with right foot pain and discoloration. CT results positive for mural thrombus/plaque and occlusions in the posterior tibial artery and anterior tibial artery bilaterally(likely related to embolic disease). Baseline labs have been ordered and are pending.  Goal of Therapy:  Heparin level 0.3-0.7 units/ml Monitor platelets by anticoagulation protocol: Yes  Date Time HL Rate/Comment 6/16 2052 0.84 Supratherapeutic/Decrease  1100 > 950 u/hr 6/17 0316 0.55 Therapeutic x 1  Plan:  Continue heparin infusion at 950 units/hr Recheck HL in 6 hr to confirm, then daily Continue to monitor H&H and platelets  7/17, PharmD, [facility] 06/04/2022 3:50 AM

## 2022-06-04 NOTE — ED Notes (Signed)
Pt back from Nuc Med.

## 2022-06-04 NOTE — Progress Notes (Signed)
Progress Note   Patient: Rachel Hahn KGU:[id number] DOB: February 04, 1956 DOA: 06/03/2022     1 DOS: the patient was seen and examined on 06/04/2022   Brief hospital course: [name] is a 66 year old female with depression, hypertension, GERD, who presented to ED on 06/03/2022 for evaluation of of right fifth toe discoloration.  In the ED, vitals stable except for BP 174/88.  Labs were essentially unremarkable.  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  Patient is admitted to the hospital for limb threatening ischemia.  Vascular surgery for intervention.  Cardiology consulted for preop risk stratification including stress test.  Assessment and Plan: * Peripheral arterial occlusive disease (HCC) Presented with right fifth toe discoloration, nearly black consistent with early critical limb ischemia. - Vascular surgery was consulted EDP.  They need to order Endologix stent graft.  Plan for revascularization once stent is available, will be scheduled -- Cardiology consulted for preop clearance including likely stress test - Continue heparin GTT - Pain management per orders, adjust as needed if pain is uncontrolled - Anticipate podiatry consult needed after revascularization  6/17: Patient's discolored right fifth toe is more reddish-pink versus nearly black on admission.  Patient and husband report significant improvement in color and the pain.  Cardiology took patient for stress test today.      Tobacco dependence Nicotine patch ordered. Tobacco cessation counseling done on admission, to continue given extensive vascular disease, cessation is absolutely necessary..  Primary hypertension Continue on atenolol  Depression Continue home sertraline  GERD (gastroesophageal reflux disease) Continue  PPI  Chronic pain syndrome History of chronic low back pain. - Lidocaine patch as needed ordered - Pain control per orders, adjust as needed if pain is uncontrolled        Subjective: Patient seen in the ED holding for bed with husband at bedside today.  She is hopeful that while vascular surgery is waiting for the stent to arrive that she could go home and return for the surgery later.  We discussed the risks associated with this given the severe narrowing of her blood vessels and risk of further ischemia that could be even more life-threatening than toe.  She expresses understanding and agreement.  Says stress test went okay this morning but the medication made her very sick feeling.  Feeling okay now.  No other acute complaints at this time.    She and husband note lightening of the color of her toe, today more red/pink as compared to very dark nearly black when they came in.  She is not complaining of pain in the toe today.  Physical Exam: Vitals:   06/04/22 0900 06/04/22 1100 06/04/22 1300 06/04/22 1454  BP: (!) 149/66 (!) 155/77 (!) 147/73 (!) 156/71  Pulse: 71 78 82 85  Resp: 16 16 18 18   Temp:    98.1 F (36.7 C)  TempSrc:      SpO2: 98% 93% 95% 92%  Weight:      Height:       General exam: awake, alert, no acute distress HEENT: atraumatic, clear conjunctiva, anicteric sclera, moist mucus membranes, hearing grossly normal  Respiratory system: CTAB but diminished throughout, no wheezes, rales or rhonchi, normal respiratory effort. Cardiovascular system: normal S1/S2, RRR, no pedal edema.  Central nervous system: A&O x3. no gross focal neurologic deficits, normal speech Extremities: Right fifth toe  is erythematous, D right third and fourth toes showing early erythema at the tips, no peripheral edema no Skin: dry, intact, normal temperature, toes as above Psychiatry: normal mood, congruent affect, judgement and insight appear normal   Data Reviewed:  Notable labs:  Creatinine 1.15 up slightly from 1.02 otherwise labs are within normal limits  Family Communication: Husband at bedside on rounds  Disposition: Status is: Inpatient Remains inpatient appropriate because: Remains on IV heparin with pending revascularization.  Patient is at high risk for limb threatening ischemia and warrants ongoing inpatient management and close monitoring until revascularization is performed.   Planned Discharge Destination: Home    Time spent: 45 minutes  Author: Pennie Banter, DO 06/04/2022 7:30 PM  For on call review [email or web address].

## 2022-06-04 NOTE — ED Notes (Signed)
Pt up to restroom to void.  

## 2022-06-05 ENCOUNTER — Inpatient Hospital Stay
Admit: 2022-06-05 | Discharge: 2022-06-05 | Disposition: A | Discharge status: Home / Self Care | Payer: 59 | Attending: Internal Medicine | Admitting: Internal Medicine

## 2022-06-05 DIAGNOSIS — I779 Disorder of arteries and arterioles, unspecified: Secondary | ICD-10-CM | POA: Diagnosis not present

## 2022-06-05 LAB — CBC
HCT: 43.3 % (ref 36.0–46.0)
Hemoglobin: 13.5 g/dL (ref 12.0–15.0)
MCH: 28.9 pg (ref 26.0–34.0)
MCHC: 31.2 g/dL (ref 30.0–36.0)
MCV: 92.7 fL (ref 80.0–100.0)
Platelets: 189 10*3/uL (ref 150–400)
RBC: 4.67 MIL/uL (ref 3.87–5.11)
RDW: 14.4 % (ref 11.5–15.5)
WBC: 9 10*3/uL (ref 4.0–10.5)
nRBC: 0 % (ref 0.0–0.2)

## 2022-06-05 LAB — HEPARIN LEVEL (UNFRACTIONATED): Heparin Unfractionated: 0.41 IU/mL (ref 0.30–0.70)

## 2022-06-05 MED ORDER — PERFLUTREN LIPID MICROSPHERE
1.0000 mL | INTRAVENOUS | Status: AC | PRN
  Administered 2022-06-05: 4 mL via INTRAVENOUS

## 2022-06-05 MED ORDER — ORAL CARE MOUTH RINSE: 15.0000 mL | OROMUCOSAL | Status: DC | PRN

## 2022-06-05 NOTE — Progress Notes (Signed)
Progress Note   Patient: Rachel Hahn [id number]:[id number] DOB: Nov 28, 1956 DOA: 06/03/2022     2 DOS: the patient was seen and examined on 06/05/2022   Brief hospital course: Ms. Jode Lippe is a 66 year old female with depression, hypertension, GERD, who presented to ED on 06/03/2022 for evaluation of of right fifth toe discoloration.  In the ED, vitals stable except for BP 174/88.  Labs were essentially unremarkable.  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  Patient is admitted to the hospital for limb threatening ischemia.  Vascular surgery for intervention.  Cardiology consulted for preop risk stratification including stress test.  Assessment and Plan: * Peripheral arterial occlusive disease (HCC) Presented with right fifth toe discoloration, nearly black consistent with early critical limb ischemia. - Vascular surgery was consulted EDP.  Pending delivery of Endologix stent graft.  Plan for revascularization once stent is available. -- Cardiology consulted for preop clearance including likely stress test - Continue heparin GTT - Pain management per orders, adjust as needed if pain is uncontrolled - Anticipate podiatry consult needed after revascularization  6/18: Right fifth toe has darkened from yesterday to today.      Tobacco dependence Nicotine patch ordered. Tobacco cessation counseling done on admission, to continue given extensive vascular disease, cessation is absolutely necessary.. -- Patient declined nicotine patch or gum for withdrawal symptoms  Primary hypertension Continue on atenolol  Depression Continue home sertraline  GERD (gastroesophageal reflux disease) Continue PPI  Chronic pain syndrome History of chronic low back pain. - Lidocaine patch as needed ordered - Pain control  per orders, adjust as needed if pain is uncontrolled        Subjective: Patient awake sitting up in bed when seen on rounds.  She reports feeling fine, not having any pain in her toe.  Still wants to go home and come back for any procedure.  Really wants to go outside to smoke.  We discussed importance of cessation given her severe vascular disease.  She declines nicotine patch or gum at this time.  Feeling anxious but otherwise no acute complaints.  Physical Exam: Vitals:   06/04/22 2308 06/05/22 0324 06/05/22 0744 06/05/22 1224  BP: (!) 152/98 (!) 147/82 (!) 156/75 133/82  Pulse: 79 87 82 74  Resp:  14 14 11   Temp:  98.5 F (36.9 C) 97.7 F (36.5 C) 98 F (36.7 C)  TempSrc:  Oral Oral Oral  SpO2:  95% 93% 91%  Weight:      Height:       General exam: awake, alert, no acute distress HEENT: Hearing grossly normal, moist mucous membranes Respiratory system: Normal respiratory effort at rest, on room air. Cardiovascular system: Regular rate and rhythm, no peripheral edema.  Central nervous system: A&O x3. no gross focal neurologic deficits, normal speech Extremities: Right fifth toe color is darker today (image below), distal toe tips on the right foot slightly erythematous but not darkening Psychiatry: normal mood, congruent affect, judgement and insight appear normal       Data Reviewed:  Notable labs: Unremarkable CBC  Family Communication: Husband at bedside on rounds 6/17.  None present during rounds today  Disposition: Status is: Inpatient Remains inpatient appropriate because: Remains on IV heparin with pending revascularization.  Patient is at high risk for limb threatening ischemia and warrants ongoing inpatient management and close monitoring until revascularization  is performed.   Planned Discharge Destination: Home    Time spent: 35 minutes  Author: Pennie Banter, DO 06/05/2022 2:13 PM  For on call review [email or web address].

## 2022-06-05 NOTE — Progress Notes (Signed)
[facility] Cardiology    SUBJECTIVE: Patient doing reasonably well minimal pain preop for peripheral vascular disease surgery.   Vitals:   06/05/22 0324 06/05/22 0744 06/05/22 1224 06/05/22 2041  BP: (!) 147/82 (!) 156/75 133/82 (!) 144/68  Pulse: 87 82 74 75  Resp: 14 14 11 18   Temp: 98.5 F (36.9 C) 97.7 F (36.5 C) 98 F (36.7 C) 98.6 F (37 C)  TempSrc: Oral Oral Oral Oral  SpO2: 95% 93% 91% 94%  Weight:      Height:         Intake/Output Summary (Last 24 hours) at 06/05/2022 2147 Last data filed at 06/05/2022 1407 Gross per 24 hour  Intake 679.82 ml  Output --  Net 679.82 ml      PHYSICAL EXAM  General: Well developed, well nourished, in no acute distress HEENT:  Normocephalic and atramatic Neck:  No JVD.  Lungs: Clear bilaterally to auscultation and percussion. Heart: HRRR . Normal S1 and S2 without gallops or murmurs.  Abdomen: Bowel sounds are positive, abdomen soft and non-tender  Msk:  Back normal, normal gait. Normal strength and tone for age. Extremities: No clubbing, cyanosis or edema.  Decreased pulses discoloration of fifth toe Neuro: Alert and oriented X 3. Psych:  Good affect, responds appropriately   LABS: Basic Metabolic Panel: Recent Labs    06/03/22 1013 06/04/22 0316  NA 139 140  K 4.4 3.7  CL 106 107  CO2 26 26  GLUCOSE 114* 98  BUN 23 21  CREATININE 1.02* 1.15*  CALCIUM 9.5 8.9   Liver Function Tests: Recent Labs    06/03/22 1013  AST 17  ALT 17  ALKPHOS 85  BILITOT 0.5  PROT 7.2  ALBUMIN 3.9   No results for input(s): "LIPASE", "AMYLASE" in the last 72 hours. CBC: Recent Labs    06/03/22 1013 06/04/22 0316 06/05/22 0503  WBC 10.2 10.5 9.0  NEUTROABS 6.7  --   --   HGB 14.1 13.2 13.5  HCT 45.2 42.5 43.3  MCV 94.6 94.4 92.7  PLT 214 178 189   Cardiac Enzymes: No results for input(s): "CKTOTAL", "CKMB", "CKMBINDEX", "TROPONINI" in the last 72 hours. BNP: Invalid input(s): "POCBNP" D-Dimer: No results for  input(s): "DDIMER" in the last 72 hours. Hemoglobin A1C: No results for input(s): "HGBA1C" in the last 72 hours. Fasting Lipid Panel: No results for input(s): "CHOL", "HDL", "LDLCALC", "TRIG", "CHOLHDL", "LDLDIRECT" in the last 72 hours. Thyroid Function Tests: No results for input(s): "TSH", "T4TOTAL", "T3FREE", "THYROIDAB" in the last 72 hours.  Invalid input(s): "FREET3" Anemia Panel: No results for input(s): "VITAMINB12", "FOLATE", "FERRITIN", "TIBC", "IRON", "RETICCTPCT" in the last 72 hours.  NM Myocar Multi W/Spect W/Wall Motion / EF  Result Date: 06/04/2022   The study is normal. The study is intermediate risk.   No ST deviation was noted.   LV perfusion is normal. There is no evidence of ischemia. There is no evidence of infarction.   Left ventricular function is normal. End diastolic cavity size is normal.   EF=64% Conclusion Adequate chemical stress Normal LVF EF=64% Normal Wall Motion No evidence of ischemia Normal Myoview     Echo preserved left ventricular function EF of 60%  TELEMETRY: Normal sinus function:  ASSESSMENT AND PLAN:    Peripheral vascular disease Ischemic toe Preop for vascular procedure Smoking by history Mild obesity Chronic renal insufficiency GERD Chronic pain . Plan Early ischemia critical limb right fifth toe Preop for vascular procedure with Dr. 06/06/2022 for amputation Continue  anticoagulation with IV heparin Advised patient to refrain from tobacco abuse Continue PPI therapy omeprazole Protonix Recommend moderate to high-dose statin therapy Aspirin therapy for anticoagulation and arteriosclerotic vascular disease Recommend noninvasive cardiac evaluation including functional study probably with a Lexiscan Myoview as she is not a good ambulatory candidate Agree with echocardiogram for assessment left ventricular function which shows normal left ventricular function Significant cardiac risk for major vascular procedure   Alwyn Pea,  MD 06/05/2022 9:47 PM

## 2022-06-05 NOTE — Consult Note (Signed)
ANTICOAGULATION CONSULT NOTE  Pharmacy Consult for Heparin infusion Indication: DVT  Allergies  Allergen Reactions   Nitrofurantoin Hives   Nsaids Shortness Of Breath   Codeine Nausea And Vomiting   Ibuprofen Other (See Comments)    Shortness of breath/chest pain   Morphine And Related Nausea And Vomiting   Prednisone Other (See Comments)    Makes her feel "woot-woot", she can tolerate Symbicort.   Sudafed [Pseudoephedrine Hcl] Other (See Comments)    Chest pain/shortness of breath    Patient Measurements: Height: 5\' 1"  (154.9 cm) Weight: 78.5 kg (173 lb) IBW/kg (Calculated) : 47.8 Heparin Dosing Weight: 65.4 kg  Vital Signs: Temp: 98.5 F (36.9 C) (06/18 0324) Temp Source: Oral (06/18 0324) BP: 147/82 (06/18 0324) Pulse Rate: 87 (06/18 0324)  Labs: Recent Labs    06/03/22 1013 06/03/22 1513 06/03/22 2052 06/04/22 0316 06/04/22 0923 06/05/22 0503  HGB 14.1  --   --  13.2  --  13.5  HCT 45.2  --   --  42.5  --  43.3  PLT 214  --   --  178  --  189  APTT  --  26  --   --   --   --   LABPROT  --  12.3  --   --   --   --   INR  --  0.9  --   --   --   --   HEPARINUNFRC  --   --    < > 0.55 0.48 0.41  CREATININE 1.02*  --   --  1.15*  --   --    < > = values in this interval not displayed.     Estimated Creatinine Clearance: 46.9 mL/min (A) (by C-G formula based on SCr of 1.15 mg/dL (H)).   Medical History: Past Medical History:  Diagnosis Date   Anxiety    Depression    Hypertension    Kidney stones    Mitral valve prolapse     Medications:  No history of chronic anticoagulant use PTA  Assessment: Pharmacy has been consulted to initiate and monitor heparin in 65yo patient admitted with right foot pain and discoloration. CT results positive for mural thrombus/plaque and occlusions in the posterior tibial artery and anterior tibial artery bilaterally(likely related to embolic disease). Baseline labs have been ordered and are pending.  Goal of Therapy:   Heparin level 0.3-0.7 units/ml Monitor platelets by anticoagulation protocol: Yes  Date Time HL Rate/Comment 6/16 2052 0.84 Supratherapeutic/Decrease 1100 > 950 u/hr 6/17 0316 0.55 Therapeutic x 1 6/17 0923 0.48 Therapeutic 6/18 0503 0.41 Therapeutic x 2  Plan:  Heparin level is therapeutic. Will continue heparin infusion at 950 units/hr. Recheck heparin level and CBC with AM labs.   7/18, PharmD, [facility] 06/05/2022 6:24 AM

## 2022-06-05 NOTE — Progress Notes (Signed)
Subjective/Chief Complaint: States her feet feel much better. Wants to go home. Otherwise without complaint.   Objective: Vital signs in last 24 hours: Temp:  [97.7 F (36.5 C)-98.5 F (36.9 C)] 97.7 F (36.5 C) (06/18 0744) Pulse Rate:  [78-87] 82 (06/18 0744) Resp:  [14-18] 14 (06/18 0744) BP: (125-171)/(71-98) 156/75 (06/18 0744) SpO2:  [92 %-98 %] 93 % (06/18 0744)    Intake/Output from previous day: 06/17 0701 - 06/18 0700 In: 527.5 [P.O.:140; I.V.:387.5] Out: -  Intake/Output this shift: No intake/output data recorded.  General appearance: alert and no distress Cardio: regular rate and rhythm Extremities: Palpable DP bilaterally, slight improvement in ischemic changes of toes  Lab Results:  Recent Labs    06/04/22 0316 06/05/22 0503  WBC 10.5 9.0  HGB 13.2 13.5  HCT 42.5 43.3  PLT 178 189   BMET Recent Labs    06/03/22 1013 06/04/22 0316  NA 139 140  K 4.4 3.7  CL 106 107  CO2 26 26  GLUCOSE 114* 98  BUN 23 21  CREATININE 1.02* 1.15*  CALCIUM 9.5 8.9   PT/INR Recent Labs    06/03/22 1513  LABPROT 12.3  INR 0.9   ABG No results for input(s): "PHART", "HCO3" in the last 72 hours.  Invalid input(s): "PCO2", "PO2"  Studies/Results: NM Myocar Multi W/Spect W/Wall Motion / EF  Result Date: 06/04/2022   The study is normal. The study is intermediate risk.   No ST deviation was noted.   LV perfusion is normal. There is no evidence of ischemia. There is no evidence of infarction.   Left ventricular function is normal. End diastolic cavity size is normal.   EF=64% Conclusion Adequate chemical stress Normal LVF EF=64% Normal Wall Motion No evidence of ischemia Normal Myoview   CT ANGIO AO+BIFEM W & OR WO CONTRAST  Result Date: 06/03/2022 CLINICAL DATA:  Discoloration in the feet. EXAM: CT ANGIOGRAPHY OF ABDOMINAL AORTA WITH ILIOFEMORAL RUNOFF TECHNIQUE: Multidetector CT imaging of the abdomen, pelvis and lower extremities was performed using the  standard protocol during bolus administration of intravenous contrast. Multiplanar CT image reconstructions and MIPs were obtained to evaluate the vascular anatomy. RADIATION DOSE REDUCTION: This exam was performed according to the departmental dose-optimization program which includes automated exposure control, adjustment of the mA and/or kV according to patient size and/or use of iterative reconstruction technique. CONTRAST:  OMNIPAQUE IOHEXOL 350 MG/ML SOLN COMPARISON:  CTA chest, abdomen and pelvis 05/17/2017 FINDINGS: VASCULAR Aorta: Small amount of atherosclerotic disease involving the distal descending thoracic aorta with normal caliber. The proximal abdominal aorta is widely patent with minimal atherosclerotic disease and no aneurysm. There is a large amount mural thrombus involving the infrarenal abdominal aorta with near occlusion on sequence 4, image 75. The amount of mural thrombus and degree of stenosis in the infrarenal abdominal aorta has markedly increased since 2018. Negative for an aortic aneurysm or dissection. Celiac: Patent without evidence of aneurysm, dissection, vasculitis or significant stenosis. SMA: Patent without evidence of aneurysm, dissection, vasculitis or significant stenosis. Renals: Both renal arteries are patent without evidence of aneurysm, dissection, vasculitis, fibromuscular dysplasia or significant stenosis. IMA: Minimal flow near the origin probably related to the aortic plaque. There is flow in the IMA and suspect that large portion of the flow is supplied from SMA collateral flow. RIGHT Lower Extremity Inflow: Mild narrowing near the origin related to noncalcified plaque. Otherwise, the right common iliac artery is patent. Right internal and right external iliac arteries are  widely patent. Outflow: Common, superficial and profunda femoral arteries and the popliteal artery are patent without evidence of aneurysm, dissection, vasculitis or significant stenosis. Runoff:  Proximal runoff vessels are patent and the peroneal artery is patent to the ankle. Posterior tibial artery occludes in the mid calf and there is some distal reconstitution at the ankle with flow in the plantar branches. Anterior tibial artery occludes in the mid/distal calf. No significant flow in the dorsalis pedis artery. LEFT Lower Extremity Inflow: Common, internal and external iliac arteries are patent without evidence of aneurysm, dissection, vasculitis or significant stenosis. Outflow: Common, superficial and profunda femoral arteries and the popliteal artery are patent without evidence of aneurysm, dissection, vasculitis or significant stenosis. Runoff: No significant flow in the posterior tibial artery. Peroneal artery is patent down to the ankle and there is some reconstitution of the distal posterior tibial artery and plantar arteries. The anterior tibial artery occludes in the distal calf and there is no significant flow in the dorsalis pedis artery. Veins: No obvious venous abnormality within the limitations of this arterial phase study. Review of the MIP images confirms the above findings. NON-VASCULAR Lower chest: Volume loss at both lung bases. No large pleural effusions. Hepatobiliary: Again noted is a small low-density structure in the anterior liver that measures roughly 5 mm and could represent a cyst. Otherwise, stable appearance of the liver without acute findings. No acute abnormality to the gallbladder. No significant biliary dilatation. Pancreas: Unremarkable. No pancreatic ductal dilatation or surrounding inflammatory changes. Spleen: Normal in size without focal abnormality. Adrenals/Urinary Tract: 1.6 cm left adrenal nodule with Hounsfield units measuring -13 and this is most compatible with a left adrenal adenoma. Normal appearance of the right adrenal gland. Bilateral renal atrophy with probable bilateral cortical scarring. Again noted are multiple right renal calculi and largest  measures 6 mm in the right kidney lower pole. There is no significant right hydronephrosis. No evidence for right ureter stones. There is at least 1 cortical cyst in the right kidney. Again noted are parapelvic and cortical left renal cysts. Mild-moderate dilatation of left renal calices with at least 3 stones in left renal pelvis and the largest measures 6 mm. Multiple left renal calculi. No significant dilatation of the left ureter. No perinephric edema or stranding. Normal appearance of the urinary bladder. Stomach/Bowel: Scattered colonic diverticula without acute bowel inflammation. Normal appearance of the stomach. No evidence for bowel dilatation or obstruction. Normal appendix. Lymphatic: No abdominal or pelvic lymph node enlargement. Index left periaortic lymph node measures 8 mm in the short axis on sequence 4 image 65. Reproductive: Uterus and bilateral adnexa are unremarkable. Other: Small inguinal hernias containing fat. Tiny umbilical hernia containing fat. Negative for free fluid. Negative for free air. Musculoskeletal: No acute bone abnormality. Subcutaneous edema in the calves. IMPRESSION: VASCULAR 1. 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 The amount of mural thrombus in the abdominal aorta has markedly increased since 2018. This aortic mural thrombus is likely the source for embolic disease. 2. Bilateral runoff disease. Occlusions in the posterior tibial artery and anterior tibial artery bilaterally are likely related to embolic disease. Primary runoff vessel is the peroneal artery bilaterally. Reconstitution of the distal posterior tibial arteries bilaterally. 3. No significant outflow disease. 4. Minimal flow at the origin of the IMA related to the aortic disease but there is distal reconstitution. The other main visceral arteries are widely patent. NON-VASCULAR 1. Bilateral renal calculi with atrophy and cortical  scarring in both  kidneys. Mild-to-moderate left hydronephrosis due to stones at the left renal pelvis. Left hydronephrosis has slightly increased since 2018. Bilateral renal cysts. 2. Left adrenal adenoma. 3. Small umbilical hernia and small bilateral inguinal hernias. Electronically Signed   By: Richarda Overlie M.D.   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 Lexiscan complete, ECHO pending Continue Heparin gtt Plan for Endologix stent graft this week  LOS: 2 days    Eli Hose A 06/05/2022

## 2022-06-06 ENCOUNTER — Other Ambulatory Visit: Payer: 59

## 2022-06-06 DIAGNOSIS — I779 Disorder of arteries and arterioles, unspecified: Secondary | ICD-10-CM | POA: Diagnosis not present

## 2022-06-06 DIAGNOSIS — I714 Abdominal aortic aneurysm, without rupture, unspecified: Secondary | ICD-10-CM

## 2022-06-06 LAB — CBC
HCT: 44.2 % (ref 36.0–46.0)
Hemoglobin: 14 g/dL (ref 12.0–15.0)
MCH: 29.4 pg (ref 26.0–34.0)
MCHC: 31.7 g/dL (ref 30.0–36.0)
MCV: 92.7 fL (ref 80.0–100.0)
Platelets: 202 10*3/uL (ref 150–400)
RBC: 4.77 MIL/uL (ref 3.87–5.11)
RDW: 14.2 % (ref 11.5–15.5)
WBC: 8.9 10*3/uL (ref 4.0–10.5)
nRBC: 0 % (ref 0.0–0.2)

## 2022-06-06 LAB — ECHOCARDIOGRAM COMPLETE
AV Peak grad: 2.6 mmHg
Ao pk vel: 0.8 m/s
Height: 61 in
S' Lateral: 3 cm
Weight: 2768 oz

## 2022-06-06 LAB — HEPARIN LEVEL (UNFRACTIONATED): Heparin Unfractionated: 0.33 IU/mL (ref 0.30–0.70)

## 2022-06-06 NOTE — Progress Notes (Signed)
Progress Note   Patient: Rachel Hahn MLY:[id number] DOB: [birth year] DOA: 06/03/2022     3 DOS: the patient was seen and examined on 06/06/2022   Brief hospital course: [name] is a 66 year old female with depression, hypertension, GERD, who presented to ED on 06/03/2022 for evaluation of of right fifth toe discoloration.  In the ED, vitals stable except for BP 174/88.  Labs were essentially unremarkable.  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  Patient is admitted to the hospital for limb threatening ischemia.  Vascular surgery for intervention.  Cardiology consulted for preop risk stratification including stress test.  Assessment and Plan: * Peripheral arterial occlusive disease (HCC) Presented with right fifth toe discoloration, nearly black consistent with early critical limb ischemia. - Vascular surgery was consulted EDP.  Pending delivery of Endologix stent graft.  Plan for revascularization once stent is available. -- Cardiology consulted for preop clearance including likely stress test - Continue heparin GTT - Pain management per orders, adjust as needed if pain is uncontrolled - Anticipate podiatry consult needed after revascularization  6/18: Right fifth toe has darkened from yesterday to today.      Tobacco dependence Nicotine patch ordered. Tobacco cessation counseling done on admission, to continue given extensive vascular disease, cessation is absolutely necessary.. -- Patient declined nicotine patch or gum for withdrawal symptoms  Primary hypertension Continue on atenolol  Depression Continue home sertraline  GERD (gastroesophageal reflux disease) Continue PPI  Chronic pain syndrome History of chronic low back pain. - Lidocaine patch as needed ordered - Pain control  per orders, adjust as needed if pain is uncontrolled        Subjective: Patient awake sitting up in bed when seen on rounds.  She continues to report anxiety over not knowing what exactly the plan is or when things will happen as far as procedures or surgery go.  She denies any pain in her foot or right fifth toe.  Reports the color is better again today.  No other acute complaints other than wanting to go home and wanting to go outside to smoke.  Physical Exam: Vitals:   06/05/22 2359 06/06/22 0415 06/06/22 0704 06/06/22 1134  BP: 131/70 135/74 (!) 143/76 (!) 147/72  Pulse: 80 77 74 72  Resp: 16 15 18 14   Temp: 98.7 F (37.1 C) 98.1 F (36.7 C) 97.9 F (36.6 C) 98 F (36.7 C)  TempSrc: Oral  Oral Oral  SpO2: 90% 90% 91% 95%  Weight:      Height:       General exam: awake, alert, no acute distress HEENT: Hearing grossly normal, moist mucous membranes Respiratory system: Normal respiratory effort at rest, on room air. Cardiovascular system: Regular rate and rhythm, no peripheral edema.  Central nervous system: A&O x3. no gross focal neurologic deficits, normal speech Extremities: Right fifth toe color is lighter today with an area of normal flesh tone skin, distal toe tips on the right foot slightly erythematous but not darkening Psychiatry: normal mood, congruent affect, judgement and insight appear normal    Data Reviewed: CBC unremarkable  Echocardiogram 6/18 --normal. LVEF 60 to 65%, no LV regional WMA's, normal diastolic parameters, normal RV systolic function, no significant valvular disease.  Family Communication: Husband at bedside on rounds 6/17.  None present during rounds today  Disposition: Status is: Inpatient Remains inpatient appropriate  because: Remains on IV heparin with pending revascularization.  Patient is at high risk for limb threatening ischemia and warrants ongoing inpatient management and close monitoring until revascularization is performed.    Planned Discharge Destination: Home    Time spent: 35 minutes  Author: Pennie Banter, DO 06/06/2022 2:49 PM  For on call review [email or web address].

## 2022-06-06 NOTE — Progress Notes (Signed)
[facility]  Daily Progress Note   Subjective  -   Still having some pain but improved.  Toes a little better.  Objective Vitals:   06/05/22 2359 06/06/22 0415 06/06/22 0704 06/06/22 1134  BP: 131/70 135/74 (!) 143/76 (!) 147/72  Pulse: 80 77 74 72  Resp: 16 15 18 14   Temp: 98.7 F (37.1 C) 98.1 F (36.7 C) 97.9 F (36.6 C) 98 F (36.7 C)  TempSrc: Oral  Oral Oral  SpO2: 90% 90% 91% 95%  Weight:      Height:        Intake/Output Summary (Last 24 hours) at 06/06/2022 1637 Last data filed at 06/06/2022 1607 Gross per 24 hour  Intake 247 ml  Output --  Net 247 ml    PULM  CTAB CV  RRR VASC  weakly palpable pedal pulses.  Right fifth toe remains very dark and discolored but there is sluggish capillary refill.  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 Significantly improved on anticoagulation, but has a high-grade stenosis associated with the thrombus continue gangrenous changes and needs revascularization Plan aortic stent graft similar to an aneurysm stent graft on Wednesday Continue anticoagulation until that time Risks and benefits discussed with the patient and the patient is agreeable to proceed   Monday  06/06/2022, 4:37 PM

## 2022-06-06 NOTE — Consult Note (Signed)
ANTICOAGULATION CONSULT NOTE  Pharmacy Consult for Heparin infusion Indication: DVT  Allergies  Allergen Reactions   Nitrofurantoin Hives   Nsaids Shortness Of Breath   Codeine Nausea And Vomiting   Ibuprofen Other (See Comments)    Shortness of breath/chest pain   Morphine And Related Nausea And Vomiting   Prednisone Other (See Comments)    Makes her feel "woot-woot", she can tolerate Symbicort.   Sudafed [Pseudoephedrine Hcl] Other (See Comments)    Chest pain/shortness of breath    Patient Measurements: Height: 5\' 1"  (154.9 cm) Weight: 78.5 kg (173 lb) IBW/kg (Calculated) : 47.8 Heparin Dosing Weight: 65.4 kg  Vital Signs: Temp: 98.1 F (36.7 C) (06/19 0415) Temp Source: Oral (06/18 2359) BP: 135/74 (06/19 0415) Pulse Rate: 77 (06/19 0415)  Labs: Recent Labs    06/03/22 1013 06/03/22 1513 06/03/22 2052 06/04/22 0316 06/04/22 0923 06/05/22 0503 06/06/22 0559  HGB 14.1  --   --  13.2  --  13.5 14.0  HCT 45.2  --   --  42.5  --  43.3 44.2  PLT 214  --   --  178  --  189 202  APTT  --  26  --   --   --   --   --   LABPROT  --  12.3  --   --   --   --   --   INR  --  0.9  --   --   --   --   --   HEPARINUNFRC  --   --    < > 0.55 0.48 0.41 0.33  CREATININE 1.02*  --   --  1.15*  --   --   --    < > = values in this interval not displayed.     Estimated Creatinine Clearance: 46.9 mL/min (A) (by C-G formula based on SCr of 1.15 mg/dL (H)).   Medical History: Past Medical History:  Diagnosis Date   Anxiety    Depression    Hypertension    Kidney stones    Mitral valve prolapse     Medications:  No history of chronic anticoagulant use PTA  Assessment: Pharmacy has been consulted to initiate and monitor heparin in 65yo patient admitted with right foot pain and discoloration. CT results positive for mural thrombus/plaque and occlusions in the posterior tibial artery and anterior tibial artery bilaterally(likely related to embolic disease). Baseline labs  have been ordered and are pending.  Goal of Therapy:  Heparin level 0.3-0.7 units/ml Monitor platelets by anticoagulation protocol: Yes  Date Time HL Rate/Comment 6/16 2052 0.84 Supratherapeutic/Decrease 1100 > 950 u/hr 6/17 0316 0.55 Therapeutic x 1 6/17 0923 0.48 Therapeutic 6/18 0503 0.41 Therapeutic x 2 6/19 0559 0.33 Therapeutic x 3  Plan:  Heparin level is therapeutic. Will continue heparin infusion at 950 units/hr. Recheck heparin level and CBC with AM labs.   7/19, PharmD, [facility] 06/06/2022 6:51 AM

## 2022-06-07 DIAGNOSIS — I779 Disorder of arteries and arterioles, unspecified: Secondary | ICD-10-CM | POA: Diagnosis not present

## 2022-06-07 LAB — CBC
HCT: 45.1 % (ref 36.0–46.0)
Hemoglobin: 14.2 g/dL (ref 12.0–15.0)
MCH: 29.6 pg (ref 26.0–34.0)
MCHC: 31.5 g/dL (ref 30.0–36.0)
MCV: 94.2 fL (ref 80.0–100.0)
Platelets: 208 10*3/uL (ref 150–400)
RBC: 4.79 MIL/uL (ref 3.87–5.11)
RDW: 14.4 % (ref 11.5–15.5)
WBC: 9.4 10*3/uL (ref 4.0–10.5)
nRBC: 0 % (ref 0.0–0.2)

## 2022-06-07 LAB — HEPARIN LEVEL (UNFRACTIONATED)
Heparin Unfractionated: 0.28 IU/mL — ABNORMAL LOW (ref 0.30–0.70)
Heparin Unfractionated: 0.62 IU/mL (ref 0.30–0.70)
Heparin Unfractionated: 0.6 IU/mL (ref 0.30–0.70)

## 2022-06-07 MED ORDER — CEFAZOLIN SODIUM-DEXTROSE 2-4 GM/100ML-% IV SOLN
2.0000 g | INTRAVENOUS | Status: AC
  Administered 2022-06-08: 2 g via INTRAVENOUS

## 2022-06-07 MED ORDER — SODIUM CHLORIDE 0.9 % IV SOLN: INTRAVENOUS | Status: DC

## 2022-06-07 MED ORDER — HEPARIN BOLUS VIA INFUSION
1000.0000 [IU] | Freq: Once | INTRAVENOUS | Status: AC
  Administered 2022-06-07: 1000 [IU] via INTRAVENOUS
  Filled 2022-06-07: qty 1000

## 2022-06-07 NOTE — Consult Note (Signed)
ANTICOAGULATION CONSULT NOTE  Pharmacy Consult for Heparin infusion Indication: DVT  Allergies  Allergen Reactions   Nitrofurantoin Hives   Nsaids Shortness Of Breath   Codeine Nausea And Vomiting   Ibuprofen Other (See Comments)    Shortness of breath/chest pain   Morphine And Related Nausea And Vomiting   Prednisone Other (See Comments)    Makes her feel "woot-woot", she can tolerate Symbicort.   Sudafed [Pseudoephedrine Hcl] Other (See Comments)    Chest pain/shortness of breath    Patient Measurements: Height: 5\' 1"  (154.9 cm) Weight: 78.5 kg (173 lb) IBW/kg (Calculated) : 47.8 Heparin Dosing Weight: 65.4 kg  Vital Signs: Temp: 97.6 F (36.4 C) (06/20 2024) Temp Source: Oral (06/20 2024) BP: 97/79 (06/20 2024) Pulse Rate: 75 (06/20 2024)  Labs: Recent Labs    06/05/22 0503 06/06/22 0559 06/07/22 0559 06/07/22 1539 06/07/22 2147  HGB 13.5 14.0 14.2  --   --   HCT 43.3 44.2 45.1  --   --   PLT 189 202 208  --   --   HEPARINUNFRC 0.41 0.33 0.28* 0.62 0.60     Estimated Creatinine Clearance: 46.9 mL/min (A) (by C-G formula based on SCr of 1.15 mg/dL (H)).   Medical History: Past Medical History:  Diagnosis Date   Anxiety    Depression    Hypertension    Kidney stones    Mitral valve prolapse     Medications:  No history of chronic anticoagulant use PTA  Assessment: Pharmacy has been consulted to initiate and monitor heparin in 65yo patient admitted with right foot pain and discoloration. CT results positive for mural thrombus/plaque and occlusions in the posterior tibial artery and anterior tibial artery bilaterally(likely related to embolic disease). Baseline labs have been ordered and are pending.  Goal of Therapy:  Heparin level 0.3-0.7 units/ml Monitor platelets by anticoagulation protocol: Yes  Date Time HL Rate/Comment 6/16 2052 0.84 Supratherapeutic/Decrease 1100 > 950 u/hr 6/17 0316 0.55 Therapeutic x  1 6/17 0923 0.48 Therapeutic 6/18 0503 0.41 Therapeutic x 2 6/19 0559 0.33 Therapeutic x 3 6/20     0559    0.28     SUBtherapeutic  6/20  1539 0.62 Therapeutic x 1 6/20     2147    0.60    Therapeutic X 2   Plan:  6/20:  HL @ 2147 = 0.60,  therapeutic X 2 Will continue pt on current rate and recheck HL on 6/21 with AM labs.   7/21, PharmD Clinical Pharmacist 06/07/2022 10:15 PM

## 2022-06-07 NOTE — Consult Note (Signed)
ANTICOAGULATION CONSULT NOTE  Pharmacy Consult for Heparin infusion Indication: DVT  Allergies  Allergen Reactions   Nitrofurantoin Hives   Nsaids Shortness Of Breath   Codeine Nausea And Vomiting   Ibuprofen Other (See Comments)    Shortness of breath/chest pain   Morphine And Related Nausea And Vomiting   Prednisone Other (See Comments)    Makes her feel "woot-woot", she can tolerate Symbicort.   Sudafed [Pseudoephedrine Hcl] Other (See Comments)    Chest pain/shortness of breath    Patient Measurements: Height: 5\' 1"  (154.9 cm) Weight: 78.5 kg (173 lb) IBW/kg (Calculated) : 47.8 Heparin Dosing Weight: 65.4 kg  Vital Signs: Temp: 98 F (36.7 C) (06/20 0900) Temp Source: Oral (06/20 0900) BP: 123/75 (06/20 1303) Pulse Rate: 69 (06/20 1303)  Labs: Recent Labs    06/05/22 0503 06/06/22 0559 06/07/22 0559 06/07/22 1539  HGB 13.5 14.0 14.2  --   HCT 43.3 44.2 45.1  --   PLT 189 202 208  --   HEPARINUNFRC 0.41 0.33 0.28* 0.62     Estimated Creatinine Clearance: 46.9 mL/min (A) (by C-G formula based on SCr of 1.15 mg/dL (H)).   Medical History: Past Medical History:  Diagnosis Date   Anxiety    Depression    Hypertension    Kidney stones    Mitral valve prolapse     Medications:  No history of chronic anticoagulant use PTA  Assessment: Pharmacy has been consulted to initiate and monitor heparin in 65yo patient admitted with right foot pain and discoloration. CT results positive for mural thrombus/plaque and occlusions in the posterior tibial artery and anterior tibial artery bilaterally(likely related to embolic disease). Baseline labs have been ordered and are pending.  Goal of Therapy:  Heparin level 0.3-0.7 units/ml Monitor platelets by anticoagulation protocol: Yes  Date Time HL Rate/Comment 6/16 2052 0.84 Supratherapeutic/Decrease 1100 > 950 u/hr 6/17 0316 0.55 Therapeutic x 1 6/17 0923 0.48 Therapeutic 6/18 0503 0.41 Therapeutic x  2 6/19 0559 0.33 Therapeutic x 3 6/20     0559    0.28     SUBtherapeutic  6/20  1539 0.62 Therapeutic x 1  Plan:  Therapeutic x 1 Will continue heparin drip rate at 1050 units/hr. Will check confirmatory HL in 6 hours.  7/20, PharmD Clinical Pharmacist 06/07/2022 4:16 PM

## 2022-06-07 NOTE — Progress Notes (Signed)
  Progress Note   Patient: Rachel Hahn ZOX:[id number] DOB: August 26, 1956 DOA: 06/03/2022     4 DOS: the patient was seen and examined on 06/07/2022   Brief hospital course: [name] is a 66 year old female with depression, hypertension, GERD, who presented to ED on 06/03/2022 for evaluation of of right fifth toe discoloration.  In the ED, vitals stable except for BP 174/88.  Labs were essentially unremarkable.  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  Patient is admitted to the hospital for limb threatening ischemia.  Vascular surgery for intervention.  Cardiology consulted for preop risk stratification including stress test.  Assessment and Plan: * Peripheral arterial occlusive disease (HCC) Presented with right fifth toe discoloration, nearly black consistent with early critical limb ischemia. - Vascular surgery consulted -- Have been awaiting delivery of Endologix stent graft.  Plan for revascularization tomorrow 6/21. - Continue heparin drip - Pain management per orders, adjust as needed if pain is uncontrolled - Anticipate podiatry consult needed after revascularization      Tobacco dependence Nicotine patch ordered. Tobacco cessation counseling done on admission, to continue given extensive vascular disease, cessation is absolutely necessary.. -- Patient declined nicotine patch or gum for withdrawal symptoms  Primary hypertension Continue on atenolol  Depression Continue home sertraline  GERD (gastroesophageal reflux disease) Continue PPI  Chronic pain syndrome History of chronic low back pain. - Lidocaine patch as needed ordered - Pain control per orders, adjust as needed if pain is uncontrolled        Subjective: Patient feeling better today, now that she knows what the plan  is.  Looks forward to getting procedure done tomorrow.  Hopeful she won't lose her toe.  Denies pain or any other complaints.   Physical Exam: Vitals:   06/06/22 2359 06/07/22 0455 06/07/22 0900 06/07/22 1303  BP: 128/71 (!) 107/58 114/75 123/75  Pulse: 74 72 74 69  Resp: 16 16 16 20   Temp: 98 F (36.7 C) 98.1 F (36.7 C) 98 F (36.7 C)   TempSrc: Oral Oral Oral   SpO2: 95% 95% 96% 93%  Weight:      Height:       General exam: awake, alert, no acute distress HEENT: Hearing grossly normal, moist mucous membranes Respiratory system: Normal respiratory effort at rest, on room air. Cardiovascular system: Regular rate and rhythm, no peripheral edema.  Central nervous system: A&O x3. no gross focal neurologic deficits, normal speech Extremities: Right fifth toe color is lighter today with an area of normal flesh tone skin, distal toe tips on the right foot slightly erythematous but not darkening Psychiatry: normal mood, congruent affect, judgement and insight appear normal    Data Reviewed: No new labs today   Family Communication: Husband at bedside on rounds 6/17.  None present during rounds today  Disposition: Status is: Inpatient Remains inpatient appropriate because: Remains on IV heparin with pending revascularization procedure with vascular tomorrow.  Patient is at high risk for limb threatening ischemia and warrants ongoing inpatient management and close monitoring until revascularization is performed.   Planned Discharge Destination: Home    Time spent: 35 minutes  Author: 7/17, DO 06/07/2022 4:16 PM  For on call review [email or web address].

## 2022-06-07 NOTE — Consult Note (Signed)
ANTICOAGULATION CONSULT NOTE  Pharmacy Consult for Heparin infusion Indication: DVT  Allergies  Allergen Reactions   Nitrofurantoin Hives   Nsaids Shortness Of Breath   Codeine Nausea And Vomiting   Ibuprofen Other (See Comments)    Shortness of breath/chest pain   Morphine And Related Nausea And Vomiting   Prednisone Other (See Comments)    Makes her feel "woot-woot", she can tolerate Symbicort.   Sudafed [Pseudoephedrine Hcl] Other (See Comments)    Chest pain/shortness of breath    Patient Measurements: Height: 5\' 1"  (154.9 cm) Weight: 78.5 kg (173 lb) IBW/kg (Calculated) : 47.8 Heparin Dosing Weight: 65.4 kg  Vital Signs: Temp: 98.1 F (36.7 C) (06/20 0455) Temp Source: Oral (06/20 0455) BP: 107/58 (06/20 0455) Pulse Rate: 72 (06/20 0455)  Labs: Recent Labs    06/05/22 0503 06/06/22 0559 06/07/22 0559  HGB 13.5 14.0 14.2  HCT 43.3 44.2 45.1  PLT 189 202 208  HEPARINUNFRC 0.41 0.33 0.28*     Estimated Creatinine Clearance: 46.9 mL/min (A) (by C-G formula based on SCr of 1.15 mg/dL (H)).   Medical History: Past Medical History:  Diagnosis Date   Anxiety    Depression    Hypertension    Kidney stones    Mitral valve prolapse     Medications:  No history of chronic anticoagulant use PTA  Assessment: Pharmacy has been consulted to initiate and monitor heparin in 65yo patient admitted with right foot pain and discoloration. CT results positive for mural thrombus/plaque and occlusions in the posterior tibial artery and anterior tibial artery bilaterally(likely related to embolic disease). Baseline labs have been ordered and are pending.  Goal of Therapy:  Heparin level 0.3-0.7 units/ml Monitor platelets by anticoagulation protocol: Yes  Date Time HL Rate/Comment 6/16 2052 0.84 Supratherapeutic/Decrease 1100 > 950 u/hr 6/17 0316 0.55 Therapeutic x 1 6/17 0923 0.48 Therapeutic 6/18 0503 0.41 Therapeutic x 2 6/19 0559 0.33 Therapeutic x 3 6/20      0559    0.28     SUBtherapeutic   Plan:  6/20:  HL @ 0559 = 0.28, subtherapeutic  Will order heparin 1000 units IV X 1 bolus and increase drip rate to 1050 units/hr. Will recheck HL 6 hrs after rate change.   Kennard Fildes D 06/07/2022 6:56 AM

## 2022-06-07 NOTE — Progress Notes (Signed)
[facility]  Daily Progress Note   Subjective  -   Still has some pain in her feet and toes but overall it is improved.  No major events overnight  Objective Vitals:   06/06/22 2359 06/07/22 0455 06/07/22 0900 06/07/22 1303  BP: 128/71 (!) 107/58 114/75 123/75  Pulse: 74 72 74 69  Resp: 16 16 16 20   Temp: 98 F (36.7 C) 98.1 F (36.7 C) 98 F (36.7 C)   TempSrc: Oral Oral Oral   SpO2: 95% 95% 96% 93%  Weight:      Height:        Intake/Output Summary (Last 24 hours) at 06/07/2022 1310 Last data filed at 06/07/2022 0044 Gross per 24 hour  Intake 159.72 ml  Output --  Net 159.72 ml    PULM  CTAB CV  RRR VASC  right fifth toe remains dark and discolored and likely nonsalvageable.  Cap refill and color on the left foot toes improved  Laboratory CBC    Component Value Date/Time   WBC 9.4 06/07/2022 0559   HGB 14.2 06/07/2022 0559   HCT 45.1 06/07/2022 0559   PLT 208 06/07/2022 0559    BMET    Component Value Date/Time   NA 140 06/04/2022 0316   K 3.7 06/04/2022 0316   CL 107 06/04/2022 0316   CO2 26 06/04/2022 0316   GLUCOSE 98 06/04/2022 0316   BUN 21 06/04/2022 0316   CREATININE 1.15 (H) 06/04/2022 0316   CALCIUM 8.9 06/04/2022 0316   GFRNONAA 53 (L) 06/04/2022 0316   GFRAA >60 05/18/2017 0130    Assessment/Planning:   Atheroembolization to both lower extremities worse on the right than the left from severe aortoiliac disease For aortoiliac stent graft tomorrow similar to an aneurysm repair due to all the aortic thrombus present Risks and benefits are discussed and she is agreeable to proceed    05/20/2017  06/07/2022, 1:10 PM

## 2022-06-07 NOTE — Progress Notes (Addendum)
Nutrition Brief Note  RD consulted for diet education.  Wt Readings from Last 15 Encounters:  06/03/22 78.5 kg  10/06/21 78.5 kg  05/17/17 70.4 kg  04/20/16 65.8 kg   Pt with depression, hypertension, GERD, who presented for evaluation of of right fifth toe discoloration.  Pt admitted with peripheral arterial occlusive disease.   Per vascular surgery notes, plan for revascularization once stent is available.   Spoke with pt at bedside. She reports good appetite. She is questioning why she is on a Heart Healthy diet and which foods are available to her; she shares her diet is very limited secondary to restrictions. RD liberalized diet to provide wider variety of meal selections.   Nutrition-Focused physical exam completed. Findings are no fat depletion, no muscle depletion, and no edema.    Body mass index is 32.69 kg/m. 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 Obesity is a complex, chronic medical condition that is optimally managed by a multidisciplinary care team. Weight loss is not an ideal goal for an acute inpatient hospitalization. However, if further work-up for obesity is warranted, consider outpatient referral to outpatient bariatric service and/or [facility].    Current diet order is Heart Healthy, patient is consuming approximately 100% of meals at this time. Labs and medications reviewed.   No nutrition interventions warranted at this time. If nutrition issues arise, please consult RD.   Levada Schilling, RD, LDN, CDCES Registered Dietitian II Certified Diabetes Care and Education Specialist Please refer to [facility] for RD and/or RD on-call/weekend/after hours pager

## 2022-06-08 ENCOUNTER — Inpatient Hospital Stay: Payer: 59 | Admitting: Anesthesiology

## 2022-06-08 ENCOUNTER — Encounter
Admission: EM | Disposition: A | Discharge status: Home / Self Care | Payer: Self-pay | Source: Home / Self Care | Attending: Internal Medicine

## 2022-06-08 DIAGNOSIS — I714 Abdominal aortic aneurysm, without rupture, unspecified: Secondary | ICD-10-CM

## 2022-06-08 DIAGNOSIS — I779 Disorder of arteries and arterioles, unspecified: Secondary | ICD-10-CM | POA: Diagnosis not present

## 2022-06-08 LAB — BASIC METABOLIC PANEL
Anion gap: 7 (ref 5–15)
Anion gap: 7 (ref 5–15)
BUN: 24 mg/dL — ABNORMAL HIGH (ref 8–23)
BUN: 19 mg/dL (ref 8–23)
CO2: 28 mmol/L (ref 22–32)
CO2: 25 mmol/L (ref 22–32)
Calcium: 9.2 mg/dL (ref 8.9–10.3)
Calcium: 8.6 mg/dL — ABNORMAL LOW (ref 8.9–10.3)
Chloride: 105 mmol/L (ref 98–111)
Chloride: 105 mmol/L (ref 98–111)
Creatinine, Ser: 1.12 mg/dL — ABNORMAL HIGH (ref 0.44–1.00)
Creatinine, Ser: 1.16 mg/dL — ABNORMAL HIGH (ref 0.44–1.00)
GFR, Estimated: 55 mL/min — ABNORMAL LOW (ref >60)
GFR, Estimated: 53 mL/min — ABNORMAL LOW (ref >60)
Glucose, Bld: 106 mg/dL — ABNORMAL HIGH (ref 70–99)
Glucose, Bld: 153 mg/dL — ABNORMAL HIGH (ref 70–99)
Potassium: 4.5 mmol/L (ref 3.5–5.1)
Potassium: 4.5 mmol/L (ref 3.5–5.1)
Sodium: 140 mmol/L (ref 135–145)
Sodium: 137 mmol/L (ref 135–145)

## 2022-06-08 LAB — CBC
HCT: 43.2 % (ref 36.0–46.0)
Hemoglobin: 13.6 g/dL (ref 12.0–15.0)
MCH: 29.5 pg (ref 26.0–34.0)
MCHC: 31.5 g/dL (ref 30.0–36.0)
MCV: 93.7 fL (ref 80.0–100.0)
Platelets: 184 10*3/uL (ref 150–400)
RBC: 4.61 MIL/uL (ref 3.87–5.11)
RDW: 14.4 % (ref 11.5–15.5)
WBC: 13.7 10*3/uL — ABNORMAL HIGH (ref 4.0–10.5)
nRBC: 0 % (ref 0.0–0.2)

## 2022-06-08 LAB — HEPARIN LEVEL (UNFRACTIONATED): Heparin Unfractionated: 0.62 IU/mL (ref 0.30–0.70)

## 2022-06-08 LAB — MRSA NEXT GEN BY PCR, NASAL: MRSA by PCR Next Gen: NOT DETECTED

## 2022-06-08 LAB — TYPE AND SCREEN
ABO/RH(D): B POS
Antibody Screen: NEGATIVE

## 2022-06-08 LAB — ABO/RH: ABO/RH(D): B POS

## 2022-06-08 SURGERY — AORTIC INTERVENTION
Anesthesia: General

## 2022-06-08 MED ORDER — HYDROMORPHONE HCL 1 MG/ML IJ SOLN: 0.5000 mg | INTRAMUSCULAR | Status: DC | PRN

## 2022-06-08 MED ORDER — ACETAMINOPHEN 650 MG RE SUPP: 325.0000 mg | RECTAL | Status: DC | PRN

## 2022-06-08 MED ORDER — GLYCOPYRROLATE 0.2 MG/ML IJ SOLN
INTRAMUSCULAR | Status: AC
  Filled 2022-06-08: qty 1

## 2022-06-08 MED ORDER — FENTANYL CITRATE (PF) 100 MCG/2ML IJ SOLN
INTRAMUSCULAR | Status: DC | PRN
  Administered 2022-06-08: 25 ug via INTRAVENOUS
  Administered 2022-06-08: 50 ug via INTRAVENOUS
  Administered 2022-06-08: 25 ug via INTRAVENOUS

## 2022-06-08 MED ORDER — SODIUM CHLORIDE 0.9 % IV SOLN: INTRAVENOUS | Status: DC

## 2022-06-08 MED ORDER — SODIUM CHLORIDE FLUSH 0.9 % IV SOLN
INTRAVENOUS | Status: AC
  Filled 2022-06-08: qty 3

## 2022-06-08 MED ORDER — PROMETHAZINE HCL 25 MG/ML IJ SOLN
INTRAMUSCULAR | Status: AC
  Filled 2022-06-08: qty 1

## 2022-06-08 MED ORDER — DROPERIDOL 2.5 MG/ML IJ SOLN: 0.6250 mg | Freq: Once | INTRAMUSCULAR | Status: DC | PRN

## 2022-06-08 MED ORDER — LIDOCAINE HCL (CARDIAC) PF 100 MG/5ML IV SOSY
PREFILLED_SYRINGE | INTRAVENOUS | Status: DC | PRN
  Administered 2022-06-08: 100 mg via INTRAVENOUS

## 2022-06-08 MED ORDER — LABETALOL HCL 5 MG/ML IV SOLN: 10.0000 mg | INTRAVENOUS | Status: DC | PRN

## 2022-06-08 MED ORDER — ROCURONIUM BROMIDE 10 MG/ML (PF) SYRINGE
PREFILLED_SYRINGE | INTRAVENOUS | Status: AC
  Filled 2022-06-08: qty 10

## 2022-06-08 MED ORDER — ALUM & MAG HYDROXIDE-SIMETH 200-200-20 MG/5ML PO SUSP: 15.0000 mL | ORAL | Status: DC | PRN

## 2022-06-08 MED ORDER — PROMETHAZINE HCL 25 MG/ML IJ SOLN
6.2500 mg | INTRAMUSCULAR | Status: DC | PRN
  Administered 2022-06-08: 6.25 mg via INTRAVENOUS

## 2022-06-08 MED ORDER — PHENOL 1.4 % MT LIQD: 1.0000 | OROMUCOSAL | Status: DC | PRN

## 2022-06-08 MED ORDER — VASOPRESSIN 20 UNIT/ML IV SOLN
INTRAVENOUS | Status: AC
  Filled 2022-06-08: qty 1

## 2022-06-08 MED ORDER — HEPARIN SODIUM (PORCINE) 1000 UNIT/ML IJ SOLN
INTRAMUSCULAR | Status: DC | PRN
  Administered 2022-06-08: 6000 [IU] via INTRAVENOUS

## 2022-06-08 MED ORDER — ASPIRIN 81 MG PO TBEC
81.0000 mg | DELAYED_RELEASE_TABLET | Freq: Every day | ORAL | Status: DC
  Administered 2022-06-09: 81 mg via ORAL
  Filled 2022-06-08: qty 1

## 2022-06-08 MED ORDER — ACETAMINOPHEN 325 MG PO TABS: 325.0000 mg | ORAL_TABLET | ORAL | Status: DC | PRN

## 2022-06-08 MED ORDER — MIDAZOLAM HCL 2 MG/2ML IJ SOLN
INTRAMUSCULAR | Status: AC
  Filled 2022-06-08: qty 2

## 2022-06-08 MED ORDER — HYDRALAZINE HCL 20 MG/ML IJ SOLN: 5.0000 mg | INTRAMUSCULAR | Status: DC | PRN

## 2022-06-08 MED ORDER — PHENYLEPHRINE HCL-NACL 20-0.9 MG/250ML-% IV SOLN
INTRAVENOUS | Status: DC | PRN
  Administered 2022-06-08: 40 ug/min via INTRAVENOUS

## 2022-06-08 MED ORDER — FENTANYL CITRATE (PF) 100 MCG/2ML IJ SOLN
INTRAMUSCULAR | Status: AC
  Filled 2022-06-08: qty 2

## 2022-06-08 MED ORDER — HEPARIN SODIUM (PORCINE) 1000 UNIT/ML IJ SOLN
INTRAMUSCULAR | Status: AC
  Filled 2022-06-08: qty 10

## 2022-06-08 MED ORDER — ROCURONIUM BROMIDE 100 MG/10ML IV SOLN
INTRAVENOUS | Status: DC | PRN
  Administered 2022-06-08: 20 mg via INTRAVENOUS
  Administered 2022-06-08: 50 mg via INTRAVENOUS
  Administered 2022-06-08: 10 mg via INTRAVENOUS

## 2022-06-08 MED ORDER — ACETAMINOPHEN 500 MG PO TABS
1000.0000 mg | ORAL_TABLET | Freq: Once | ORAL | Status: AC
  Administered 2022-06-08: 1000 mg via ORAL
  Filled 2022-06-08: qty 2

## 2022-06-08 MED ORDER — LIDOCAINE HCL (PF) 2 % IJ SOLN
INTRAMUSCULAR | Status: AC
  Filled 2022-06-08: qty 5

## 2022-06-08 MED ORDER — EPHEDRINE 5 MG/ML INJ
INTRAVENOUS | Status: AC
  Filled 2022-06-08: qty 5

## 2022-06-08 MED ORDER — POTASSIUM CHLORIDE CRYS ER 20 MEQ PO TBCR: 20.0000 meq | EXTENDED_RELEASE_TABLET | Freq: Every day | ORAL | Status: DC | PRN

## 2022-06-08 MED ORDER — PROPOFOL 10 MG/ML IV BOLUS
INTRAVENOUS | Status: DC | PRN
  Administered 2022-06-08: 110 mg via INTRAVENOUS

## 2022-06-08 MED ORDER — SUGAMMADEX SODIUM 200 MG/2ML IV SOLN
INTRAVENOUS | Status: DC | PRN
  Administered 2022-06-08: 157 mg via INTRAVENOUS

## 2022-06-08 MED ORDER — FAMOTIDINE IN NACL 20-0.9 MG/50ML-% IV SOLN
20.0000 mg | Freq: Every day | INTRAVENOUS | Status: DC
  Administered 2022-06-08: 20 mg via INTRAVENOUS
  Filled 2022-06-08: qty 50

## 2022-06-08 MED ORDER — PHENYLEPHRINE HCL-NACL 20-0.9 MG/250ML-% IV SOLN
INTRAVENOUS | Status: AC
  Filled 2022-06-08: qty 250

## 2022-06-08 MED ORDER — ACETAMINOPHEN 10 MG/ML IV SOLN
1000.0000 mg | Freq: Once | INTRAVENOUS | Status: DC | PRN
Start: 2022-06-08 — End: 2022-06-08

## 2022-06-08 MED ORDER — LABETALOL HCL 5 MG/ML IV SOLN
INTRAVENOUS | Status: AC
  Filled 2022-06-08: qty 4

## 2022-06-08 MED ORDER — CHLORHEXIDINE GLUCONATE CLOTH 2 % EX PADS
6.0000 | MEDICATED_PAD | Freq: Every day | CUTANEOUS | Status: DC
  Administered 2022-06-09: 6 via TOPICAL

## 2022-06-08 MED ORDER — ONDANSETRON HCL 4 MG/2ML IJ SOLN
4.0000 mg | Freq: Four times a day (QID) | INTRAMUSCULAR | Status: DC | PRN
Start: 2022-06-08 — End: 2022-06-09

## 2022-06-08 MED ORDER — IODIXANOL 320 MG/ML IV SOLN
INTRAVENOUS | Status: DC | PRN
  Administered 2022-06-08: 15 mL via INTRA_ARTERIAL

## 2022-06-08 MED ORDER — ONDANSETRON HCL 4 MG/2ML IJ SOLN
INTRAMUSCULAR | Status: DC | PRN
  Administered 2022-06-08: 4 mg via INTRAVENOUS

## 2022-06-08 MED ORDER — MIDAZOLAM HCL 2 MG/2ML IJ SOLN
INTRAMUSCULAR | Status: DC | PRN
  Administered 2022-06-08: 1 mg via INTRAVENOUS

## 2022-06-08 MED ORDER — NITROGLYCERIN IN D5W 200-5 MCG/ML-% IV SOLN: 5.0000 ug/min | INTRAVENOUS | Status: DC

## 2022-06-08 MED ORDER — PROPOFOL 10 MG/ML IV BOLUS
INTRAVENOUS | Status: AC
  Filled 2022-06-08: qty 40

## 2022-06-08 MED ORDER — OXYCODONE HCL 5 MG/5ML PO SOLN: 5.0000 mg | Freq: Once | ORAL | Status: DC | PRN

## 2022-06-08 MED ORDER — MAGNESIUM SULFATE 2 GM/50ML IV SOLN: 2.0000 g | Freq: Every day | INTRAVENOUS | Status: DC | PRN

## 2022-06-08 MED ORDER — CEFAZOLIN SODIUM-DEXTROSE 2-4 GM/100ML-% IV SOLN
2.0000 g | Freq: Three times a day (TID) | INTRAVENOUS | Status: AC
  Administered 2022-06-08 – 2022-06-09 (×2): 2 g via INTRAVENOUS
  Filled 2022-06-08 (×2): qty 100

## 2022-06-08 MED ORDER — SODIUM CHLORIDE 0.9 % IV SOLN
500.0000 mL | Freq: Once | INTRAVENOUS | Status: DC | PRN
Start: 2022-06-08 — End: 2022-06-09

## 2022-06-08 MED ORDER — FENTANYL CITRATE (PF) 100 MCG/2ML IJ SOLN: 25.0000 ug | INTRAMUSCULAR | Status: DC | PRN

## 2022-06-08 MED ORDER — OXYCODONE HCL 5 MG PO TABS: 5.0000 mg | ORAL_TABLET | Freq: Once | ORAL | Status: DC | PRN

## 2022-06-08 MED ORDER — EPHEDRINE SULFATE (PRESSORS) 50 MG/ML IJ SOLN
INTRAMUSCULAR | Status: DC | PRN
  Administered 2022-06-08: 5 mg via INTRAVENOUS
  Administered 2022-06-08: 10 mg via INTRAVENOUS

## 2022-06-08 MED ORDER — OXYCODONE-ACETAMINOPHEN 5-325 MG PO TABS: 1.0000 | ORAL_TABLET | ORAL | Status: DC | PRN

## 2022-06-08 MED ORDER — METOPROLOL TARTRATE 5 MG/5ML IV SOLN: 2.0000 mg | INTRAVENOUS | Status: DC | PRN

## 2022-06-08 MED ORDER — DOPAMINE-DEXTROSE 3.2-5 MG/ML-% IV SOLN: 3.0000 ug/kg/min | INTRAVENOUS | Status: DC

## 2022-06-08 MED ORDER — CLOPIDOGREL BISULFATE 75 MG PO TABS
75.0000 mg | ORAL_TABLET | Freq: Every day | ORAL | Status: DC
  Administered 2022-06-09: 75 mg via ORAL
  Filled 2022-06-08: qty 1

## 2022-06-08 MED ORDER — DOCUSATE SODIUM 100 MG PO CAPS
100.0000 mg | ORAL_CAPSULE | Freq: Every day | ORAL | Status: DC
  Administered 2022-06-09: 100 mg via ORAL
  Filled 2022-06-08: qty 1

## 2022-06-08 MED ORDER — ONDANSETRON HCL 4 MG/2ML IJ SOLN
INTRAMUSCULAR | Status: AC
  Filled 2022-06-08: qty 2

## 2022-06-08 MED ORDER — LACTATED RINGERS IV SOLN: INTRAVENOUS | Status: DC | PRN

## 2022-06-08 MED ORDER — GUAIFENESIN-DM 100-10 MG/5ML PO SYRP: 15.0000 mL | ORAL_SOLUTION | ORAL | Status: DC | PRN

## 2022-06-08 SURGICAL SUPPLY — 29 items
BALLN DORADO 8X60X80 (BALLOONS) ×6
BALLOON DORADO 8X60X80 (BALLOONS) IMPLANT
CATH ACCU-VU SIZ PIG 5F 70CM (CATHETERS) ×1 IMPLANT
CATH BALLN CODA 9X100X32 (BALLOONS) ×1 IMPLANT
CATH BEACON 5 .035 65 KMP TIP (CATHETERS) ×1 IMPLANT
CLOSURE PERCLOSE PROSTYLE (VASCULAR PRODUCTS) ×3 IMPLANT
COVER DRAPE FLUORO 36X44 (DRAPES) ×2 IMPLANT
COVER PROBE U/S 5X48 (MISCELLANEOUS) ×1 IMPLANT
DEVICE SAFEGUARD 24CM (GAUZE/BANDAGES/DRESSINGS) ×2 IMPLANT
DEVICE STARCLOSE SE CLOSURE (Vascular Products) ×1 IMPLANT
DRYSEAL FLEXSHEATH 16FR 33CM (SHEATH) ×1
ENSNARE 9-15 (MISCELLANEOUS) ×1 IMPLANT
KIT ENCORE 26 ADVANTAGE (KITS) ×2 IMPLANT
NDL ENTRY 21GA 7CM ECHOTIP (NEEDLE) IMPLANT
NEEDLE ENTRY 21GA 7CM ECHOTIP (NEEDLE) ×2 IMPLANT
SET INTRO CAPELLA COAXIAL (SET/KITS/TRAYS/PACK) ×1 IMPLANT
SHEATH AFX SYS S1745 (SHEATH) ×1 IMPLANT
SHEATH BRITE TIP 6FRX11 (SHEATH) ×2 IMPLANT
SHEATH BRITE TIP 8FRX11 (SHEATH) ×2 IMPLANT
SHEATH DRYSEAL FLEX 16FR 33CM (SHEATH) IMPLANT
SHEATH PINNACLE 11FRX10 (SHEATH) ×2 IMPLANT
SPONGE XRAY 4X4 16PLY STRL (MISCELLANEOUS) ×3 IMPLANT
STENT GRAFT 22X44 OVATION (Endovascular Graft) ×1 IMPLANT
STENT GRAFT AFX 22X60/16X40 (Endovascular Graft) ×1 IMPLANT
SYR MEDRAD MARK 7 150ML (SYRINGE) ×1 IMPLANT
TUBING CONTRAST HIGH PRESS 72 (TUBING) ×1 IMPLANT
WIRE G LUND 35X260X7 (WIRE) ×1 IMPLANT
WIRE GUIDERIGHT .035X150 (WIRE) ×2 IMPLANT
WIRE NITINOL .018 (WIRE) ×1 IMPLANT

## 2022-06-08 NOTE — Op Note (Signed)
OPERATIVE NOTE   PROCEDURE: US guidance for vascular access, bilateral femoral arteries Catheter placement into aorta from bilateral femoral approaches Placement of a 22 mm diameter, 60 mm length proximal, 13 mm diameter by 40 mm length iliac limb Endologix aortic endoprosthesis main body  Placement of the 22 mm diameter by 45 mm length aortic extension cuff in the proximal infrarenal aorta ProGlide closure devices right femoral artery with a Star close device left common femoral artery.  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  FLUOROSCOPY TIME: 6.8 minutes  FINDING(S): 1.  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 y.o. female who presents with embolic disease to both feet worse on the right than the left and found to have a penetrating aortic ulcer with extensive aortic thrombosis and disease extending into the origins of the common iliac arteries.  Work-up demonstrates the patient is a suitable candidate for aortobiiliac stenting using the Endologix stent graft.  The risks and benefits have been reviewed with the patient alternative therapies including open aortobifemoral bypass was discussed.  All questions have been answered.  The patient is voicing severe lifestyle limitation and therefore wishes to proceed with endograft repair.  DESCRIPTION: After obtaining full informed written consent, the patient was brought back to the operating room and placed supine upon the operating table.  The  patient received IV antibiotics prior to induction.  After obtaining adequate anesthesia, the patient was prepped and draped in the standard fashion for endovascular aortoiliac stent graft placement.  Co-surgeons are required because this is a complex bilateral procedure with work being performed simultaneously from both the right femoral and left femoral approach.  This also expedites the procedure making a shorter operative time reducing complications and improving patient safety.  We then began by gaining access to both femoral arteries with US guidance with me working on the left and Dr. Gilda Crease working on the right.  The femoral arteries were found to be patent and accessed without difficulty with a needle under ultrasound guidance without difficulty on each side and permanent images were recorded.  We then placed 2 proglide devices on the right side in a pre-close fashion followed by placement of an 8 French sheath.  The left side was then accessed with ultrasound as described above and a J-wire was advanced without difficulty.  A 6 French sheath was placed on the left  The patient was then given  6000 units of intravenous heparin.   The Pigtail catheter was placed into the aorta from the  left side. Using this image, we selected a 22 mm diameter proximal 60 mm length proximal, 13 mm diameter by 40 mm length iliac limbs Endologix device device.  A Lunderquist wire was then advanced up the right side.  The delivery sheath was advanced over a stiff wire and position with the tip of the sheath just above the aortic bifurcation.   The 22 mm diameter, 60 mm proximal main body was then placed into the delivery sheath and the contralateral snare wire advanced through the delivery sheath and positioned at the tip of  the sheath.  Working from the left side a trilobed snare was advanced through the 6 French sheath and positioned several centimeters above the tip of the delivery sheath.  The contralateral snare  wire was then advanced through the snare and the snare used to capture the contralateral wire.  The wire was then fed from the ipsilateral side while it was pulled out the 6 [place] sheath.  Taking care and making adjustments to ensure there was no wire wrap.  The Endologix main body was then advanced completely out of the delivery sheath again ensuring that there was no wire wrap.  Subsequently working from both the right and left sides the main body was pulled down to seated squarely on the aortic bifurcation.  The outer yellow catheter was then removed from the contralateral wire opening the contralateral limb of the main body.  Pigtail catheter was then advanced up the main wire into the proximal infrarenal aorta.  Snare wire was then removed.  The main body deployment was then completed.  A J wire was then advanced through the pigtail catheter on the left side.  Angioplasty balloons were then selected to post dilate the stent graft, a 8 mm diameter by 6 cm length balloon was selected for the right limb and a 8 mm diameter by 6 cm length balloon was selected for the left.  Simultaneous inflations to 10 atmospheres were then performed.  The aortic portion of the main body was then dilated with both balloon simultaneously as well.  The pigtail catheter was then advanced through the 6 French sheath and positioned just above the renal arteries.  Bolus injection contrast was then performed to image the reconstruction.  Upon review of the bolus injection through the pigtail further stents were needed.  An additional 22 mm diameter by 45 mm length Endologix aortic cuff was advanced proximally and parked approximately 1 cm below the right renal artery with about 2 cm of overlap within the initial stent graft.  The Coda balloon was then used to post dilate the aorta after the stent placement.  Completion imaging showed brisk flow through the stent in the aorta and bilateral common iliac arteries with no evidence of  extravasation or flow limitation and no significant residual stenosis.  Both hypogastric arteries and the renal arteries were widely patent.  At this point we elected to terminate the procedure. We secured the pro glide devices and the StarClose for hemostasis on the femoral arteries. The skin incision was closed with a 4-0 Monocryl. Dermabond and pressure dressing were placed. The patient was taken to the recovery room in stable condition having tolerated the procedure well.  COMPLICATIONS: none  CONDITION: stable  [name]  06/08/2022, 11:26 AM

## 2022-06-08 NOTE — Progress Notes (Signed)
PROGRESS NOTE    Rachel Hahn  STM:[id number] DOB: 1956/03/06  DOA: 06/03/2022 Date of Service: 06/08/22 PCP: [facility]     Brief Narrative / Hospital Course:  [name] is a 66 year old female with depression, hypertension, GERD, who presented to ED on 06/03/2022 for evaluation of of right fifth toe discoloration. In the ED, vitals stable except for BP 174/88.  Labs were essentially unremarkable.  Patient was found to have critical ischemia of her right fifth digit on CTA aorta plus bifemoral which showed large infrarenal abdominal aortic mural thrombus/plaque with focal severe stenosis, markedly increased since 2018.Patient is admitted to the hospital for limb threatening ischemia.  Vascular surgery for intervention.  Cardiology consulted for preop risk stratification including stress test.   Consultants:  Vascular surgery cardiology  Procedures: 06/08/22 stenting to aorta, left common femoral artery.    Subjective: Patient reports feeling well after her procedure today, biggest concern is she would like to eat her dinner, she states pain is controlled, no abdominal pain, no shortness of breath.  Right fifth toe still dusky appearing bu pt states better      ASSESSMENT & PLAN:   Principal Problem:   Peripheral arterial occlusive disease (HCC) Active Problems:   Chronic pain syndrome   GERD (gastroesophageal reflux disease)   Depression   Primary hypertension   Tobacco dependence   Peripheral arterial occlusive disease (HCC) Presented with right fifth toe discoloration, nearly black consistent with early critical limb ischemia.  Looks bluish/purple at this time  s/p 06/08/22 stenting to aorta, left common femoral artery. May need to involve podiatry  Primary hypertension Continue on atenolol  GERD (gastroesophageal reflux disease) Continue PPI  Depression Continue home sertraline  Tobacco dependence Nicotine patch ordered but was  declined Tobacco cessation counseling done on admission, to continue given extensive vascular disease, cessation is absolutely necessary.  Chronic pain syndrome History of chronic low back pain. - Lidocaine patch as needed ordered - Pain control per orders, adjust as needed if pain is uncontrolled    DVT prophylaxis: heparin Code Status: FULL Family Communication: none at this time  Disposition Plan / TOC needs: home when able Barriers to discharge / significant pending items: vascular clearance, possible podiatry consultation              Objective: Vitals:   06/07/22 2024 06/07/22 2353 06/08/22 0429 06/08/22 0722  BP: 97/79 126/65 (!) 153/70 (!) 146/71  Pulse: 75 76 72 77  Resp: 16 16 18 18   Temp: 97.6 F (36.4 C) 97.8 F (36.6 C) 97.9 F (36.6 C) 98.5 F (36.9 C)  TempSrc: Oral Oral Oral Oral  SpO2: 92% 93% 96% 97%  Weight:      Height:        Intake/Output Summary (Last 24 hours) at 06/08/2022 06/10/2022 Last data filed at 06/08/2022 0400 Gross per 24 hour  Intake 328.92 ml  Output --  Net 328.92 ml   Filed Weights   06/03/22 1008  Weight: 78.5 kg    Examination:  Constitutional:  VS as above General Appearance: alert, well-developed, well-nourished, NAD Eyes: Normal lids and conjunctive, non-icteric sclera Ears, Nose, Mouth, Throat: Normal appearance Neck: No masses, trachea midline Respiratory: Normal respiratory effort Breath sounds normal, no wheeze/rhonchi/rales Cardiovascular: S1/S2 normal, no murmur/rub/gallop auscultated No lower extremity edema R 5th toe dusky/purple  Gastrointestinal: Nontender, no masses Neurological: No cranial nerve deficit on limited exam Psychiatric: Normal judgment/insight Normal mood and affect       Scheduled Medications:  acetaminophen  1,000 mg Oral Once   atenolol  100 mg Oral Daily   losartan  50 mg Oral Daily   mometasone-formoterol  2 puff Inhalation BID   pantoprazole  40 mg Oral Daily    pneumococcal 20-valent conjugate vaccine  0.5 mL Intramuscular Tomorrow-1000   rosuvastatin  40 mg Oral Daily   sertraline  100 mg Oral Daily    Continuous Infusions:  sodium chloride 20 mL/hr at 06/08/22 0654    ceFAZolin (ANCEF) IV     heparin 1,050 Units/hr (06/08/22 0129)    PRN Medications:  acetaminophen **OR** acetaminophen, lidocaine, melatonin, morphine injection, nicotine, ondansetron **OR** ondansetron (ZOFRAN) IV, mouth rinse, polyethylene glycol, senna-docusate  Antimicrobials:  Anti-infectives (From admission, onward)    Start     Dose/Rate Route Frequency Ordered Stop   06/07/22 0901  ceFAZolin (ANCEF) IVPB 2g/100 mL premix        2 g 200 mL/hr over 30 Minutes Intravenous 30 min pre-op 06/07/22 0901         Data Reviewed: I have personally reviewed following labs and imaging studies  CBC: Recent Labs  Lab 06/03/22 1013 06/04/22 0316 06/05/22 0503 06/06/22 0559 06/07/22 0559  WBC 10.2 10.5 9.0 8.9 9.4  NEUTROABS 6.7  --   --   --   --   HGB 14.1 13.2 13.5 14.0 14.2  HCT 45.2 42.5 43.3 44.2 45.1  MCV 94.6 94.4 92.7 92.7 94.2  PLT 214 178 189 202 208   Basic Metabolic Panel: Recent Labs  Lab 06/03/22 1013 06/04/22 0316 06/08/22 0609  NA 139 140 140  K 4.4 3.7 4.5  CL 106 107 105  CO2 26 26 28   GLUCOSE 114* 98 106*  BUN 23 21 24*  CREATININE 1.02* 1.15* 1.12*  CALCIUM 9.5 8.9 9.2   GFR: Estimated Creatinine Clearance: 48.1 mL/min (A) (by C-G formula based on SCr of 1.12 mg/dL (H)). Liver Function Tests: Recent Labs  Lab 06/03/22 1013  AST 17  ALT 17  ALKPHOS 85  BILITOT 0.5  PROT 7.2  ALBUMIN 3.9   No results for input(s): "LIPASE", "AMYLASE" in the last 168 hours. No results for input(s): "AMMONIA" in the last 168 hours. Coagulation Profile: Recent Labs  Lab 06/03/22 1513  INR 0.9   Cardiac Enzymes: No results for input(s): "CKTOTAL", "CKMB", "CKMBINDEX", "TROPONINI" in the last 168 hours. BNP (last 3 results) No results for  input(s): "PROBNP" in the last 8760 hours. HbA1C: No results for input(s): "HGBA1C" in the last 72 hours. CBG: No results for input(s): "GLUCAP" in the last 168 hours. Lipid Profile: No results for input(s): "CHOL", "HDL", "LDLCALC", "TRIG", "CHOLHDL", "LDLDIRECT" in the last 72 hours. Thyroid Function Tests: No results for input(s): "TSH", "T4TOTAL", "FREET4", "T3FREE", "THYROIDAB" in the last 72 hours. Anemia Panel: No results for input(s): "VITAMINB12", "FOLATE", "FERRITIN", "TIBC", "IRON", "RETICCTPCT" in the last 72 hours. Urine analysis: No results found for: "COLORURINE", "APPEARANCEUR", "LABSPEC", "PHURINE", "GLUCOSEU", "HGBUR", "BILIRUBINUR", "KETONESUR", "PROTEINUR", "UROBILINOGEN", "NITRITE", "LEUKOCYTESUR" Sepsis Labs: @LABRCNTIP (procalcitonin:4,lacticidven:4)  No results found for this or any previous visit (from the past 240 hour(s)).       Radiology Studies last 96 hours: ECHOCARDIOGRAM COMPLETE  Result Date: 06/06/2022    ECHOCARDIOGRAM REPORT   Patient Name:   Rachel Hahn Date of Exam: 06/05/2022 Medical Rec #:  Jeani Hawking    Height:       61.0 in Accession #:    06/07/2022   Weight:       173.0 lb Date of  Birth:  Jan 12, 1956    BSA:          1.776 m Patient Age:    65 years     BP:           147/82 mmHg Patient Gender: F            HR:           83 bpm. Exam Location:  [facility] Procedure: 2D Echo and Intracardiac Opacification Agent Indications:     Pre Op Clearance  History:         Patient has no prior history of Echocardiogram examinations.                  Risk Factors:Current Smoker and Hypertension.  Sonographer:     L. Thornton-Maynard Referring Phys:  [id number] DWAYNE D CALLWOOD Diagnosing Phys: Alwyn Pea MD  Sonographer Comments: Suboptimal apical window. TDS due to smoking history. IMPRESSIONS  1. Left ventricular ejection fraction, by estimation, is 60 to 65%. The left ventricle has normal function. The left ventricle has no regional wall motion abnormalities.  Left ventricular diastolic parameters were normal.  2. Right ventricular systolic function is normal. The right ventricular size is normal.  3. The mitral valve is normal in structure. No evidence of mitral valve regurgitation.  4. The aortic valve is normal in structure. Aortic valve regurgitation is not visualized. FINDINGS  Left Ventricle: Left ventricular ejection fraction, by estimation, is 60 to 65%. The left ventricle has normal function. The left ventricle has no regional wall motion abnormalities. Definity contrast agent was given IV to delineate the left ventricular  endocardial borders. The left ventricular internal cavity size was normal in size. There is no left ventricular hypertrophy. Left ventricular diastolic parameters were normal. Right Ventricle: The right ventricular size is normal. No increase in right ventricular wall thickness. Right ventricular systolic function is normal. Left Atrium: Left atrial size was normal in size. Right Atrium: Right atrial size was normal in size. Pericardium: There is no evidence of pericardial effusion. Mitral Valve: The mitral valve is normal in structure. No evidence of mitral valve regurgitation. Tricuspid Valve: The tricuspid valve is normal in structure. Tricuspid valve regurgitation is not demonstrated. Aortic Valve: The aortic valve is normal in structure. Aortic valve regurgitation is not visualized. Aortic valve peak gradient measures 2.6 mmHg. Pulmonic Valve: The pulmonic valve was normal in structure. Pulmonic valve regurgitation is not visualized. Aorta: The ascending aorta was not well visualized. IAS/Shunts: No atrial level shunt detected by color flow Doppler.  LEFT VENTRICLE PLAX 2D LVIDd:         4.50 cm LVIDs:         3.00 cm LV PW:         0.65 cm LV IVS:        0.70 cm LVOT diam:     1.80 cm LVOT Area:     2.54 cm  RIGHT VENTRICLE TAPSE (M-mode): 1.5 cm LEFT ATRIUM         Index LA diam:    2.40 cm 1.35 cm/m  AORTIC VALVE             PULMONIC  VALVE AV Vmax:      79.90 cm/s PV Vmax:       0.64 m/s AV Peak Grad: 2.6 mmHg   PV Peak grad:  1.7 mmHg  AORTA Ao Root diam: 3.40 cm Ao Asc diam:  3.10 cm MV E velocity: 64.70 cm/s MV A velocity:  58.30 cm/s  SHUNTS MV E/A ratio:  1.11        Systemic Diam: 1.80 cm Alwyn Pea MD Electronically signed by Alwyn Pea MD Signature Date/Time: 06/06/2022/6:47:29 AM    Final    NM Myocar Multi W/Spect Izetta Dakin Motion / EF  Result Date: 06/04/2022   The study is normal. The study is intermediate risk.   No ST deviation was noted.   LV perfusion is normal. There is no evidence of ischemia. There is no evidence of infarction.   Left ventricular function is normal. End diastolic cavity size is normal.   EF=64% Conclusion Adequate chemical stress Normal LVF EF=64% Normal Wall Motion No evidence of ischemia Normal Myoview            LOS: 5 days      Sunnie Nielsen, DO [facility] 06/08/2022, 8:43 AM   Staff may message me via secure chat in Epic  but this may not receive immediate response,  please page for urgent matters!  If 7PM-7AM, please contact night-coverage [email or web address]  Dictation software was used to generate the above note. Typos may occur and escape review, as with typed/written notes. Please contact Dr Lyn Hollingshead directly for clarity if needed.

## 2022-06-08 NOTE — Anesthesia Procedure Notes (Signed)
Procedure Name: Intubation Date/Time: 06/08/2022 10:08 AM  Performed by: Irving Burton, CRNAPre-anesthesia Checklist: Patient identified, Patient being monitored, Timeout performed, Emergency Drugs available and Suction available Patient Re-evaluated:Patient Re-evaluated prior to induction Oxygen Delivery Method: Circle system utilized Preoxygenation: Pre-oxygenation with 100% oxygen Induction Type: IV induction Ventilation: Mask ventilation without difficulty Laryngoscope Size: 3 and McGraph Grade View: Grade I Tube type: Oral Tube size: 7.0 mm Number of attempts: 1 Airway Equipment and Method: Stylet and Video-laryngoscopy Placement Confirmation: ETT inserted through vocal cords under direct vision, positive ETCO2 and breath sounds checked- equal and bilateral Secured at: 20 cm Tube secured with: Tape Dental Injury: Teeth and Oropharynx as per pre-operative assessment

## 2022-06-08 NOTE — Progress Notes (Signed)
Patient belongings transported to ICU room 07 by this RN. Report given to [employer]. Patient husband has [employer] account planner, and purse on his persons. Patient has dentures with her to procedure (refused to remove until very last minute).

## 2022-06-08 NOTE — Transfer of Care (Signed)
Immediate Anesthesia Transfer of Care Note  Patient: Rachel Hahn  Procedure(s) Performed: AORTIC INTERVENTION  Patient Location: PACU  Anesthesia Type:General  Level of Consciousness: awake and alert   Airway & Oxygen Therapy: Patient Spontanous Breathing and Patient connected to face mask oxygen  Post-op Assessment: Report given to RN and Post -op Vital signs reviewed and stable  Post vital signs: Reviewed and stable  Last Vitals:  Vitals Value Taken Time  BP 166/84 06/08/22 1130  Temp    Pulse 78 06/08/22 1137  Resp 11 06/08/22 1137  SpO2 99 % 06/08/22 1137  Vitals shown include unvalidated device data.  Last Pain:  Vitals:   06/08/22 0937  TempSrc: Oral  PainSc: 0-No pain         Complications: There were no known notable events for this encounter.

## 2022-06-08 NOTE — Anesthesia Preprocedure Evaluation (Addendum)
Anesthesia Evaluation  Patient identified by MRN, date of birth, ID band Patient awake    Reviewed: Allergy & Precautions, NPO status , Patient's Chart, lab work & pertinent test results  Airway Mallampati: II  TM Distance: >3 FB Neck ROM: full    Dental no notable dental hx.    Pulmonary Current Smoker and Patient abstained from smoking.,    Pulmonary exam normal        Cardiovascular Exercise Tolerance: Poor hypertension, + Peripheral Vascular Disease  Normal cardiovascular exam  ECHO 6/23: Normal Normal Myoview 6/23   Neuro/Psych PSYCHIATRIC DISORDERS Anxiety Depression Nerve entrapment syndrome of arm  Chronic Pain Syndrome    GI/Hepatic Neg liver ROS, GERD  Controlled,  Endo/Other  negative endocrine ROS  Renal/GU Renal InsufficiencyRenal disease     Musculoskeletal   Abdominal (+) + obese,   Peds  Hematology negative hematology ROS (+)   Anesthesia Other Findings Pt with atheroembolization to both lower extremities worse on the right than the left from severe aortoiliac disease. Pt will have aortoiliac stent graft today similar to an aneurysm repair due to all the aortic thrombus present.  Past Medical History: No date: Anxiety No date: Depression No date: Hypertension No date: Kidney stones No date: Mitral valve prolapse  Past Surgical History: No date: KIDNEY CYST REMOVAL No date: LITHOTRIPSY No date: TONSILLECTOMY  BMI    Body Mass Index: 32.69 kg/m      Reproductive/Obstetrics negative OB ROS                           Anesthesia Physical Anesthesia Plan  ASA: 3  Anesthesia Plan: General ETT   Post-op Pain Management: Tylenol PO (pre-op)*   Induction: Intravenous  PONV Risk Score and Plan: 2 and Ondansetron, Dexamethasone and Midazolam  Airway Management Planned: Oral ETT  Additional Equipment:   Intra-op Plan:   Post-operative Plan: Extubation in  OR  Informed Consent: I have reviewed the patients History and Physical, chart, labs and discussed the procedure including the risks, benefits and alternatives for the proposed anesthesia with the patient or authorized representative who has indicated his/her understanding and acceptance.     Dental Advisory Given  Plan Discussed with: Anesthesiologist, CRNA and Surgeon  Anesthesia Plan Comments:        Anesthesia Quick Evaluation

## 2022-06-08 NOTE — Op Note (Signed)
OPERATIVE NOTE   PROCEDURE: US guidance for vascular access, bilateral femoral arteries Catheter placement into aorta from bilateral femoral approaches Placement of a 22 mm diameter by 60 mm length proximal segment with a 13 mm diameter by 40 mm length iliac limb Endologix aortic endoprosthesis main body. Placement of a 22 mm diameter by 45 mm length aortic extension cuff in the proximal infrarenal aorta.   ProGlide closure devices right femoral artery with a Star close device left common femoral artery.  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  FLUOROSCOPY TIME: 6.8 minutes  FINDING(S): 1.  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 y.o. female who presents with lifestyle limiting claudication and mild rest pain.  Work-up demonstrates the patient is a suitable candidate for aortobiiliac stenting using the Endologix stent graft.  The risks and benefits have been reviewed with the patient alternative therapies including open aortobifemoral bypass was discussed.  All questions have been answered.  The patient is voicing severe lifestyle limitation and therefore wishes to proceed with endograft repair.  DESCRIPTION: After obtaining full informed written consent, the patient was brought back to the operating room and placed supine upon the operating table.  The patient received IV antibiotics prior to induction.  After obtaining adequate anesthesia, the patient was prepped and draped  in the standard fashion for endovascular aortoiliac stent graft placement.  Co-surgeons are required because this is a complex bilateral procedure with work being performed simultaneously from both the right femoral and left femoral approach.  This also expedites the procedure making a shorter operative time reducing complications and improving patient safety.  We then began by gaining access to both femoral arteries with US guidance with me working on the patient's right and Dr. Wyn Quaker working on the patient's left.  The femoral arteries were found to be patent and accessed without difficulty with a needle under ultrasound guidance without difficulty on each side and permanent images were recorded.  We then placed 2 proglide devices on the right side in a pre-close fashion followed by placement of an 8 French sheath.  The left side was then accessed with ultrasound as described above and a J-wire was advanced without difficulty.  A 7 French sheath was placed  The patient was then given 6000 units of intravenous heparin.   The Pigtail catheter was placed into the aorta from the left side. Using this image, we selected a 22 x 60-13 x 40 Endologix device device.  A Lunderquist wire was then advanced up the right side.  The delivery sheath was advanced over a stiff wire and position with the tip of the sheath just above the aortic bifurcation.   The Endologix main body was then placed into the delivery sheath and the contralateral snare wire advanced through the delivery sheath and positioned at the tip of the sheath.  Working from the left side a trilobed snare was advanced through the 7 French sheath and positioned several centimeters above the tip of the delivery sheath.  The contralateral snare wire was then advanced through the snare  and the snare used to capture the contralateral wire.  The wire was then fed from the ipsilateral side while it was pulled out the 7 [place] sheath.  Taking care and making  adjustments to ensure there was no wire wrap.  The Endologix main body was then advanced completely out of the delivery sheath again ensuring that there was no wire wrap.  Subsequently working from both the right and left sides the main body was pulled down to seated squarely on the aortic bifurcation.  The outer yellow catheter was then removed from the contralateral wire opening the contralateral limb of the main body.  Pigtail catheter was then advanced up the main wire into the proximal infrarenal aorta.  Snare wire was then removed.  The main body deployment was then completed.  A J wire was then advanced through the pigtail catheter on the left side.  Angioplasty balloons were then selected to post dilate the stent graft, a 8 mm x 6 cm balloon was selected for the right limb and a 8 mm x 6 cm was selected for the left.  Simultaneous inflations to 10 atmospheres were then performed.  The aortic portion of the main body was then dilated with both balloon simultaneously as well.  The pigtail catheter was then advanced through the 7 French sheath and positioned just above the renal arteries.  Bolus injection contrast was then performed to image the reconstruction.  Upon review of the bolus injection through the pigtail a proximal extender stent was needed.  An additional 22 mm x 45 mm Endologix aortic extender cuff was then selected and advanced through the right femoral sheath it was lined up so that it was approximately 1 cm below the right renal artery with 2 cm of overlap within the initial stent graft.  This was deployed without difficulty.  A Coda balloon was then used to post dilate the aortic extender cuff and the aortic portion of the stent itself.  Repeat imaging for completion now showed brisk flow through the stent in the aorta and bilateral common iliac arteries no evidence of extravasation there is now less than 10% residual stenosis throughout the entire aorta iliac system with excellent treatment  of all of the stenotic lesions both within the infrarenal aorta as well as within the common iliac arteries.  Both hypogastric arteries as well as the external iliac arteries were widely patent.  Renal arteries were remained widely patent.  At this point we elected to terminate the procedure. We secured the pro glide devices and the StarClose for hemostasis on the femoral arteries. The skin incision was closed with a 4-0 Monocryl. Dermabond and pressure dressing were placed. The patient was taken to the recovery room in stable condition having tolerated the procedure well.  COMPLICATIONS: none  CONDITION: stable  Levora Dredge  06/08/2022, 6:18 PM

## 2022-06-08 NOTE — Anesthesia Postprocedure Evaluation (Signed)
Anesthesia Post Note  Patient: Rachel Hahn  Procedure(s) Performed: AORTIC INTERVENTION  Patient location during evaluation: PACU Anesthesia Type: General Level of consciousness: awake and alert Pain management: pain level controlled Vital Signs Assessment: post-procedure vital signs reviewed and stable Respiratory status: spontaneous breathing, nonlabored ventilation, respiratory function stable and patient connected to nasal cannula oxygen Cardiovascular status: blood pressure returned to baseline and stable Postop Assessment: no apparent nausea or vomiting Anesthetic complications: no   There were no known notable events for this encounter.   Last Vitals:  Vitals:   06/08/22 1233 06/08/22 1245  BP:  (!) 142/73  Pulse: 70 71  Resp: 12 11  Temp:    SpO2: 97% 97%    Last Pain:  Vitals:   06/08/22 1230  TempSrc:   PainSc: 0-No pain                 Foye Deer

## 2022-06-08 NOTE — Consult Note (Signed)
ANTICOAGULATION CONSULT NOTE  Pharmacy Consult for Heparin infusion Indication: DVT  Allergies  Allergen Reactions   Nitrofurantoin Hives   Nsaids Shortness Of Breath   Codeine Nausea And Vomiting   Ibuprofen Other (See Comments)    Shortness of breath/chest pain   Morphine And Related Nausea And Vomiting   Prednisone Other (See Comments)    Makes her feel "woot-woot", she can tolerate Symbicort.   Sudafed [Pseudoephedrine Hcl] Other (See Comments)    Chest pain/shortness of breath    Patient Measurements: Height: 5\' 1"  (154.9 cm) Weight: 78.5 kg (173 lb) IBW/kg (Calculated) : 47.8 Heparin Dosing Weight: 65.4 kg  Vital Signs: Temp: 97.9 F (36.6 C) (06/21 0429) Temp Source: Oral (06/21 0429) BP: 153/70 (06/21 0429) Pulse Rate: 72 (06/21 0429)  Labs: Recent Labs    06/06/22 0559 06/07/22 0559 06/07/22 1539 06/07/22 2147 06/08/22 0609  HGB 14.0 14.2  --   --   --   HCT 44.2 45.1  --   --   --   PLT 202 208  --   --   --   HEPARINUNFRC 0.33 0.28* 0.62 0.60 0.62  CREATININE  --   --   --   --  1.12*     Estimated Creatinine Clearance: 48.1 mL/min (A) (by C-G formula based on SCr of 1.12 mg/dL (H)).   Medical History: Past Medical History:  Diagnosis Date   Anxiety    Depression    Hypertension    Kidney stones    Mitral valve prolapse     Medications:  No history of chronic anticoagulant use PTA  Assessment: Pharmacy has been consulted to initiate and monitor heparin in 65yo patient admitted with right foot pain and discoloration. CT results positive for mural thrombus/plaque and occlusions in the posterior tibial artery and anterior tibial artery bilaterally(likely related to embolic disease). Baseline labs have been ordered and are pending.  Goal of Therapy:  Heparin level 0.3-0.7 units/ml Monitor platelets by anticoagulation protocol: Yes  Date Time HL Rate/Comment 6/16 2052 0.84 Supratherapeutic/Decrease 1100 > 950  u/hr 6/17 0316 0.55 Therapeutic x 1 6/17 0923 0.48 Therapeutic 6/18 0503 0.41 Therapeutic x 2 6/19 0559 0.33 Therapeutic x 3 6/20     0559    0.28     SUBtherapeutic  6/20  1539 0.62 Therapeutic x 1 6/20     2147    0.60    Therapeutic X 2  6/21     0609    0.62    Therapeutic X 3   Plan:  6/21:  HL @ 0609 = 0.62, therapeutic X 3 Will continue pt on current rate and recheck HL on 6/22 with AM labs.   7/22, PharmD Clinical Pharmacist 06/08/2022 7:08 AM

## 2022-06-09 ENCOUNTER — Encounter: Payer: Self-pay | Admitting: Vascular Surgery

## 2022-06-09 DIAGNOSIS — I779 Disorder of arteries and arterioles, unspecified: Secondary | ICD-10-CM | POA: Diagnosis not present

## 2022-06-09 LAB — CBC
HCT: 39.9 % (ref 36.0–46.0)
Hemoglobin: 12.6 g/dL (ref 12.0–15.0)
MCH: 29.4 pg (ref 26.0–34.0)
MCHC: 31.6 g/dL (ref 30.0–36.0)
MCV: 93 fL (ref 80.0–100.0)
Platelets: 191 10*3/uL (ref 150–400)
RBC: 4.29 MIL/uL (ref 3.87–5.11)
RDW: 14.2 % (ref 11.5–15.5)
WBC: 16.8 10*3/uL — ABNORMAL HIGH (ref 4.0–10.5)
nRBC: 0 % (ref 0.0–0.2)

## 2022-06-09 LAB — BASIC METABOLIC PANEL
Anion gap: 5 (ref 5–15)
BUN: 21 mg/dL (ref 8–23)
CO2: 24 mmol/L (ref 22–32)
Calcium: 8.2 mg/dL — ABNORMAL LOW (ref 8.9–10.3)
Chloride: 108 mmol/L (ref 98–111)
Creatinine, Ser: 1.18 mg/dL — ABNORMAL HIGH (ref 0.44–1.00)
GFR, Estimated: 52 mL/min — ABNORMAL LOW (ref >60)
Glucose, Bld: 157 mg/dL — ABNORMAL HIGH (ref 70–99)
Potassium: 4.8 mmol/L (ref 3.5–5.1)
Sodium: 137 mmol/L (ref 135–145)

## 2022-06-09 MED ORDER — LOSARTAN POTASSIUM 25 MG PO TABS: 25.0000 mg | ORAL_TABLET | Freq: Every day | ORAL | 0 refills | Status: DC

## 2022-06-09 MED ORDER — ASPIRIN 81 MG PO TBEC: 81.0000 mg | DELAYED_RELEASE_TABLET | Freq: Every day | ORAL | 12 refills | Status: DC

## 2022-06-09 MED ORDER — ROSUVASTATIN CALCIUM 40 MG PO TABS: 40.0000 mg | ORAL_TABLET | Freq: Every day | ORAL | 0 refills | Status: DC

## 2022-06-09 MED ORDER — CLOPIDOGREL BISULFATE 75 MG PO TABS: 75.0000 mg | ORAL_TABLET | Freq: Every day | ORAL | 0 refills | Status: DC

## 2022-06-09 NOTE — Discharge Summary (Signed)
Physician Discharge Summary   Patient: Annaliesa Blann MRN: [id number]  DOB: 06-28-1956   Admit:     Date of Admission: 06/03/2022 Admitted from: home   Discharge: Date of discharge: 06/09/22 Disposition: Home Condition at discharge: good  CODE STATUS: FULL   Diet recommendation: Cardiac and Carb modified diet   Discharge Physician: Sunnie Nielsen, DO [facility]     PCP: [facility]  Recommendations for Outpatient Follow-up:  Follow up with PCP [facility] in 1-2 weeks Please obtain labs/tests: BMP, CBC in 1-2 weeks Please follow up on the following pending results: none Please follow-up as directed with vascular surgery.   Hospital Course:  Ms. Shantae Vantol is a 66 year old female with depression, hypertension, GERD, who presented to ED on 06/03/2022 for evaluation of of right fifth toe discoloration. In the ED, vitals stable except for BP 174/88.  Labs were essentially unremarkable.  Patient was found to have critical ischemia of her right fifth digit on CTA aorta plus bifemoral which showed large infrarenal abdominal aortic mural thrombus/plaque with focal severe stenosis, markedly increased since 2018.Patient is admitted to the hospital for limb threatening ischemia.  Vascular surgery for intervention.  Cardiology consulted for preop risk stratification including stress test.  Underwent stenting procedure 06/08/2022, did well postoperatively in ICU and remained stable overnight, cleared from vascular perspective and medical perspective to discharged home 06/09/2022   Consultants:  Vascular surgery cardiology   Procedures: 06/08/22 stenting to aorta, left common femoral artery.         Discharge Diagnoses: Principal Problem:   Peripheral arterial occlusive disease (HCC) Active Problems:   Chronic pain syndrome   GERD (gastroesophageal reflux disease)   Depression   Primary hypertension   Tobacco  dependence    Assessment & Plan:  Peripheral arterial occlusive disease (HCC) Presented with right fifth toe discoloration, nearly black consistent with early critical limb ischemia.  Looks bluish/purple at this time  s/p 06/08/22 stenting to aorta, left common femoral artery. Right fifth toe still slightly dusky but overall appears much better Outpatient follow-up, if recurrence may need to involve podiatry however no need to keep inpatient for this at this point  Primary hypertension Continue on atenolol  GERD (gastroesophageal reflux disease) Continue PPI  Depression Continue home sertraline  Tobacco dependence Nicotine patch ordered but was declined Tobacco cessation counseling done on admission, to continue given extensive vascular disease, cessation is absolutely necessary.  Chronic pain syndrome History of chronic low back pain.      Discharge Instructions  Discharge Instructions     Diet - low sodium heart healthy   Complete by: As directed    Increase activity slowly   Complete by: As directed        Allergies as of 06/09/2022       Reactions   Nitrofurantoin Hives   Nsaids Shortness Of Breath   Codeine Nausea And Vomiting   Ibuprofen Other (See Comments)   Shortness of breath/chest pain   Morphine And Related Nausea And Vomiting   Prednisone Other (See Comments)   Makes her feel "woot-woot", she can tolerate Symbicort.   Sudafed [pseudoephedrine Hcl] Other (See Comments)   Chest pain/shortness of breath        Medication List     TAKE these medications    acetaminophen 500 MG tablet Commonly known as: TYLENOL Tylenol Extra Strength 500 mg tablet   aspirin EC 81 MG tablet Take 1 tablet (81 mg total) by mouth daily at  6 (six) AM. Swallow whole. Start taking on: June 10, 2022 Notes to patient: Next dose due 6/23   atenolol 100 MG tablet Commonly known as: TENORMIN Take 100 mg by mouth daily. Notes to patient: Next dose due 6/23    baclofen 10 MG tablet Commonly known as: LIORESAL Take 1 tablet by mouth daily.   clopidogrel 75 MG tablet Commonly known as: PLAVIX Take 1 tablet (75 mg total) by mouth daily at 6 (six) AM. Start taking on: June 10, 2022 Notes to patient: Next dose due 6/23   gabapentin 100 MG capsule Commonly known as: NEURONTIN Take 1 capsule by mouth daily.   losartan 25 MG tablet Commonly known as: COZAAR Take 1 tablet (25 mg total) by mouth daily. Start taking on: June 10, 2022   pantoprazole 40 MG tablet Commonly known as: PROTONIX Take 1 tablet (40 mg total) by mouth daily. Notes to patient: Next dose due 6/23   rosuvastatin 40 MG tablet Commonly known as: CRESTOR Take 1 tablet (40 mg total) by mouth daily. Start taking on: June 10, 2022 Notes to patient: Next dose due 6/23   sertraline 100 MG tablet Commonly known as: ZOLOFT Take 100 mg by mouth daily. Notes to patient: Next dose due 6/23   Symbicort 160-4.5 MCG/ACT inhaler Generic drug: budesonide-formoterol Inhale 2 puffs into the lungs 2 (two) times daily.   Vitamin D3 1.25 MG (50000 UT) Caps Vitamin D3        Diet Orders (From admission, onward)     Start     Ordered   06/09/22 0845  Diet regular Room service appropriate? Yes; Fluid consistency: Thin  Diet effective now       Question Answer Comment  Room service appropriate? Yes   Fluid consistency: Thin      06/09/22 0844   06/09/22 0000  Diet - low sodium heart healthy        06/09/22 1323               Allergies  Allergen Reactions   Nitrofurantoin Hives   Nsaids Shortness Of Breath   Codeine Nausea And Vomiting   Ibuprofen Other (See Comments)    Shortness of breath/chest pain   Morphine And Related Nausea And Vomiting   Prednisone Other (See Comments)    Makes her feel "woot-woot", she can tolerate Symbicort.   Sudafed [Pseudoephedrine Hcl] Other (See Comments)    Chest pain/shortness of breath     Subjective: Patient seen and  examined at bedside in ICU, no apparent distress, she is feeling great, she states absolutely no pain, she is wanting to discharge soon as possible   Discharge Exam: Vitals:   06/09/22 1100 06/09/22 1200  BP:    Pulse: 83 85  Resp:    Temp:    SpO2: 98% 95%   Vitals:   06/09/22 1009 06/09/22 1010 06/09/22 1100 06/09/22 1200  BP:  137/84    Pulse: 83 81 83 85  Resp: 18 11    Temp:      TempSrc:      SpO2: 96% 97% 98% 95%  Weight:      Height:         General: Pt is alert, awake, not in acute distress Cardiovascular: RRR, S1/S2 +, no rubs, no gallops Respiratory: CTA bilaterally, no wheezing, no rhonchi Abdominal: Soft, NT, ND, bowel sounds + Extremities: no edema, slight dusky skin to R 5th toe 06/09/22   06/03/2022  The results of significant diagnostics from this hospitalization (including imaging, microbiology, ancillary and laboratory) are listed below for reference.     Microbiology: Recent Results (from the past 240 hour(s))  MRSA Next Gen by PCR, Nasal     Status: None   Collection Time: 06/08/22  1:57 PM   Specimen: Nasal Mucosa; Nasal Swab  Result Value Ref Range Status   MRSA by PCR Next Gen NOT DETECTED NOT DETECTED Final    Comment: (NOTE) The GeneXpert MRSA Assay (FDA approved for NASAL specimens only), is one component of a comprehensive MRSA colonization surveillance program. It is not intended to diagnose MRSA infection nor to guide or monitor treatment for MRSA infections. Test performance is not FDA approved in patients less than 64 years old. Performed at [facility], [address] [place], [place] [address]      Labs: BNP (last 3 results) No results for input(s): "BNP" in the last 8760 hours. Basic Metabolic Panel: Recent Labs  Lab 06/03/22 1013 06/04/22 0316 06/08/22 0609 06/08/22 1206 06/09/22 0337  NA 139 140 140 137 137  K 4.4 3.7 4.5 4.5 4.8  CL 106 107 105 105 108  CO2 26 26 28 25 24   GLUCOSE 114* 98  106* 153* 157*  BUN 23 21 24* 19 21  CREATININE 1.02* 1.15* 1.12* 1.16* 1.18*  CALCIUM 9.5 8.9 9.2 8.6* 8.2*   Liver Function Tests: Recent Labs  Lab 06/03/22 1013  AST 17  ALT 17  ALKPHOS 85  BILITOT 0.5  PROT 7.2  ALBUMIN 3.9   No results for input(s): "LIPASE", "AMYLASE" in the last 168 hours. No results for input(s): "AMMONIA" in the last 168 hours. CBC: Recent Labs  Lab 06/03/22 1013 06/04/22 0316 06/05/22 0503 06/06/22 0559 06/07/22 0559 06/08/22 1206 06/09/22 0337  WBC 10.2   < > 9.0 8.9 9.4 13.7* 16.8*  NEUTROABS 6.7  --   --   --   --   --   --   HGB 14.1   < > 13.5 14.0 14.2 13.6 12.6  HCT 45.2   < > 43.3 44.2 45.1 43.2 39.9  MCV 94.6   < > 92.7 92.7 94.2 93.7 93.0  PLT 214   < > 189 202 208 184 191   < > = values in this interval not displayed.   Cardiac Enzymes: No results for input(s): "CKTOTAL", "CKMB", "CKMBINDEX", "TROPONINI" in the last 168 hours. BNP: Invalid input(s): "POCBNP" CBG: No results for input(s): "GLUCAP" in the last 168 hours. D-Dimer No results for input(s): "DDIMER" in the last 72 hours. Hgb A1c No results for input(s): "HGBA1C" in the last 72 hours. Lipid Profile No results for input(s): "CHOL", "HDL", "LDLCALC", "TRIG", "CHOLHDL", "LDLDIRECT" in the last 72 hours. Thyroid function studies No results for input(s): "TSH", "T4TOTAL", "T3FREE", "THYROIDAB" in the last 72 hours.  Invalid input(s): "FREET3" Anemia work up No results for input(s): "VITAMINB12", "FOLATE", "FERRITIN", "TIBC", "IRON", "RETICCTPCT" in the last 72 hours. Urinalysis No results found for: "COLORURINE", "APPEARANCEUR", "LABSPEC", "PHURINE", "GLUCOSEU", "HGBUR", "BILIRUBINUR", "KETONESUR", "PROTEINUR", "UROBILINOGEN", "NITRITE", "LEUKOCYTESUR" Sepsis Labs Recent Labs  Lab 06/06/22 0559 06/07/22 0559 06/08/22 1206 06/09/22 0337  WBC 8.9 9.4 13.7* 16.8*   Microbiology Recent Results (from the past 240 hour(s))  MRSA Next Gen by PCR, Nasal     Status:  None   Collection Time: 06/08/22  1:57 PM   Specimen: Nasal Mucosa; Nasal Swab  Result Value Ref Range Status   MRSA by PCR Next Gen  NOT DETECTED NOT DETECTED Final    Comment: (NOTE) The GeneXpert MRSA Assay (FDA approved for NASAL specimens only), is one component of a comprehensive MRSA colonization surveillance program. It is not intended to diagnose MRSA infection nor to guide or monitor treatment for MRSA infections. Test performance is not FDA approved in patients less than 37 years old. Performed at [facility], [address] [place], [place] [address]    Imaging ECHOCARDIOGRAM COMPLETE  Result Date: 06/06/2022    ECHOCARDIOGRAM REPORT   Patient Name:   SULIANA OBREGON Date of Exam: 06/05/2022 Medical Rec #:  [id number]    Height:       61.0 in Accession #:    [id number]   Weight:       173.0 lb Date of Birth:  03-16-1956    BSA:          1.776 m Patient Age:    65 years     BP:           147/82 mmHg Patient Gender: F            HR:           83 bpm. Exam Location:  [facility] Procedure: 2D Echo and Intracardiac Opacification Agent Indications:     Pre Op Clearance  History:         Patient has no prior history of Echocardiogram examinations.                  Risk Factors:Current Smoker and Hypertension.  Sonographer:     L. Thornton-Maynard Referring Phys:  [id number] DWAYNE D CALLWOOD Diagnosing Phys: Alwyn Pea MD  Sonographer Comments: Suboptimal apical window. TDS due to smoking history. IMPRESSIONS  1. Left ventricular ejection fraction, by estimation, is 60 to 65%. The left ventricle has normal function. The left ventricle has no regional wall motion abnormalities. Left ventricular diastolic parameters were normal.  2. Right ventricular systolic function is normal. The right ventricular size is normal.  3. The mitral valve is normal in structure. No evidence of mitral valve regurgitation.  4. The aortic valve is normal in structure. Aortic valve regurgitation is not visualized.  FINDINGS  Left Ventricle: Left ventricular ejection fraction, by estimation, is 60 to 65%. The left ventricle has normal function. The left ventricle has no regional wall motion abnormalities. Definity contrast agent was given IV to delineate the left ventricular  endocardial borders. The left ventricular internal cavity size was normal in size. There is no left ventricular hypertrophy. Left ventricular diastolic parameters were normal. Right Ventricle: The right ventricular size is normal. No increase in right ventricular wall thickness. Right ventricular systolic function is normal. Left Atrium: Left atrial size was normal in size. Right Atrium: Right atrial size was normal in size. Pericardium: There is no evidence of pericardial effusion. Mitral Valve: The mitral valve is normal in structure. No evidence of mitral valve regurgitation. Tricuspid Valve: The tricuspid valve is normal in structure. Tricuspid valve regurgitation is not demonstrated. Aortic Valve: The aortic valve is normal in structure. Aortic valve regurgitation is not visualized. Aortic valve peak gradient measures 2.6 mmHg. Pulmonic Valve: The pulmonic valve was normal in structure. Pulmonic valve regurgitation is not visualized. Aorta: The ascending aorta was not well visualized. IAS/Shunts: No atrial level shunt detected by color flow Doppler.  LEFT VENTRICLE PLAX 2D LVIDd:         4.50 cm LVIDs:         3.00 cm LV PW:  0.65 cm LV IVS:        0.70 cm LVOT diam:     1.80 cm LVOT Area:     2.54 cm  RIGHT VENTRICLE TAPSE (M-mode): 1.5 cm LEFT ATRIUM         Index LA diam:    2.40 cm 1.35 cm/m  AORTIC VALVE             PULMONIC VALVE AV Vmax:      79.90 cm/s PV Vmax:       0.64 m/s AV Peak Grad: 2.6 mmHg   PV Peak grad:  1.7 mmHg  AORTA Ao Root diam: 3.40 cm Ao Asc diam:  3.10 cm MV E velocity: 64.70 cm/s MV A velocity: 58.30 cm/s  SHUNTS MV E/A ratio:  1.11        Systemic Diam: 1.80 cm Alwyn Pea MD Electronically signed by Alwyn Pea MD Signature Date/Time: 06/06/2022/6:47:29 AM    Final       Time coordinating discharge: Over 30 minutes  SIGNED:  Sunnie Nielsen DO [facility]

## 2022-06-09 NOTE — Progress Notes (Signed)
Bilateral PAD devices deflated and removed and sites assessed. Right femoral site ecchymotic, with no bleeding or hematoma noted. Denies discomfort other than mild soreness and irritated feeling in the skin. Redness noted under device in shape and size of PAD dome, but skin intact. Area cleaned and covered with 2x2 and transparent dressing. Left femoral site also ecchymotic with no bleeding or hematoma, redness in shape and size of PAD dome noted, but also partially ruptured blister with skin covering approximately 90% of wound bed. Area cleaned and covered with 2x2 and transparent dressing. Pt advised to call Dr. Driscilla Grammes office if there is further drainage, redness, or if she experiences any increased pain. Dr. Wyn Quaker subsequently noted to be speaking with patient in hallway and was updated regarding the status of left femoral site. No orders or recommendations regarding wound care received at this time. AVS, including medications, was reviewed with patient and all questions had been answered by this RN. Patient was then assisted to discharge and transported via [facility] accompanied by NT and patient's friend. Pt was alert and oriented and without complaints.

## 2022-06-14 ENCOUNTER — Other Ambulatory Visit: Payer: Self-pay | Admitting: Physical Medicine & Rehabilitation

## 2022-06-14 DIAGNOSIS — G8929 Other chronic pain: Secondary | ICD-10-CM

## 2022-06-27 ENCOUNTER — Other Ambulatory Visit: Payer: Self-pay

## 2022-06-27 ENCOUNTER — Encounter: Payer: Self-pay | Admitting: Physical Therapy

## 2022-06-27 ENCOUNTER — Ambulatory Visit: Payer: 59 | Attending: Student | Admitting: Physical Therapy

## 2022-06-27 DIAGNOSIS — M5459 Other low back pain: Secondary | ICD-10-CM | POA: Diagnosis not present

## 2022-06-27 DIAGNOSIS — M6281 Muscle weakness (generalized): Secondary | ICD-10-CM | POA: Insufficient documentation

## 2022-06-27 NOTE — Therapy (Signed)
OUTPATIENT PHYSICAL THERAPY THORACOLUMBAR EVALUATION   Patient Name: Rachel Hahn MRN: [id number] DOB:1956-06-12, 66 y.o., female Today's Date: 06/27/2022   PT End of Session - 06/27/22 1136     Visit Number 1    Number of Visits 16    Date for PT Re-Evaluation 08/22/22    PT Start Time 0845    PT Stop Time 0930    PT Time Calculation (min) 45 min    Activity Tolerance Patient tolerated treatment well    Behavior During Therapy [facility] for tasks assessed/performed             Past Medical History:  Diagnosis Date   Anxiety    Depression    Hypertension    Kidney stones    Mitral valve prolapse    Past Surgical History:  Procedure Laterality Date   AORTIC INTERVENTION N/A 06/08/2022   Procedure: AORTIC INTERVENTION;  Surgeon: Annice Needy, MD;  Location: [facility] INVASIVE CV LAB;  Service: Cardiovascular;  Laterality: N/A;   KIDNEY CYST REMOVAL     LITHOTRIPSY     TONSILLECTOMY     Patient Active Problem List   Diagnosis Date Noted   Peripheral arterial occlusive disease (HCC) 06/03/2022   GERD (gastroesophageal reflux disease) 06/03/2022   Depression 06/03/2022   Primary hypertension 06/03/2022   Tobacco dependence 06/03/2022   Lateral antebrachial cutaneous nerve injury, sequela (Right) 10/06/2021   Right forearm pain 10/06/2021   Nerve entrapment syndrome of arm (Right) 10/06/2021   Chronic pain syndrome 10/05/2021   Pharmacologic therapy 10/05/2021   Disorder of skeletal system 10/05/2021   Problems influencing health status 10/05/2021   Encounter for chronic pain management 10/05/2021   Chronic upper extremity pain (1ry area of Pain) (Right) 03/05/2021   Chest pain 05/17/2017   Accelerated hypertension 05/17/2017    PCP: PCP not listed but from [facility]   REFERRING PROVIDER: Dr. Filomena Jungling   REFERRING DIAG: M54.42,M54.41,G89.29 (ICD-10-CM) - Chronic bilateral low back pain with bilateral sciatica   Rationale for Evaluation and  Treatment Rehabilitation  THERAPY DIAG:  Other low back pain  ONSET DATE: 06/27/12  SUBJECTIVE:                                                                                                                                                                                           SUBJECTIVE STATEMENT: Pt reports seeing Dr. Mariah Milling for her chronic low back pain and she suggested trailing PT.    PERTINENT HISTORY:   Per note from Dr. Lyn Hollingshead on 06/09/22 Chronic pain syndrome History of chronic low back pain.  PAIN:  Are you having pain?  Yes: NPRS scale: 10/10 Pain location: Low back pain that radiates from around C8 spinous process to low back  Pain description: Spasm with sudden onset Aggravating factors: Not sure what causes the pain. It happens all of the sudden. Relieving factors: Nothing really helps    PRECAUTIONS: None  WEIGHT BEARING RESTRICTIONS No  FALLS:  Has patient fallen in last 6 months? No  LIVING ENVIRONMENT: Lives with: lives alone Lives in: House/apartment Stairs: Yes: External: 1 steps; none Has following equipment at home: None  OCCUPATION: Retired / No longer working   PLOF: Independent  PATIENT GOALS   Pt hopes to reduce the low back pain    OBJECTIVE:                         VITALS: BP 137/79 HR 64 SpO2 96  DIAGNOSTIC FINDINGS:  MRI scheduled and awaiting results   PATIENT SURVEYS:  FOTO 40/100 with target score of 51   SCREENING FOR RED FLAGS: Bowel or bladder incontinence: No Spinal tumors: No Cauda equina syndrome: No Compression fracture: No Abdominal aneurysm: No  COGNITION:  Overall cognitive status: Within functional limits for tasks assessed     SENSATION: WFL  MUSCLE LENGTH: Hamstrings: Right 80 deg; Left 80 deg Thomas test: Right 20 deg; Left 20 deg  POSTURE: rounded shoulders  PALPATION: TTP over L1-L4 spinous processes   LUMBAR ROM:   Active  A/PROM  eval  Flexion 90%*  Extension 90%*  Right  lateral flexion 100%  Left lateral flexion 100%  Right rotation 100%  Left rotation 100%   (Blank rows = not tested)  LOWER EXTREMITY ROM:      Active  Right 06/27/22 Left 06/27/22   Hip flexion 120 120  Hip extension 30 30  Hip abduction 45 45  Hip adduction 30 30  Hip internal rotation 45 45  Hip external rotation 45 45  Knee flexion 135 135  Knee extension 0 0  Ankle dorsiflexion 20 20  Ankle plantarflexion 50 50  Ankle inversion 35 35  Ankle eversion 15 15   (Blank rows = not tested)     LOWER EXTREMITY MMT:    MMT Right eval Left eval  Hip flexion 5 5  Hip extension 4 4  Hip abduction 4 4  Hip adduction 3+ 3+  Hip internal rotation 5 5  Hip external rotation 5 5  Knee flexion 5 5  Knee extension 5 5  Ankle dorsiflexion 5 5  Ankle plantarflexion    Ankle inversion    Ankle eversion     (Blank rows = not tested)  LUMBAR SPECIAL TESTS:  Straight leg raise test: Negative, FABER test: Negative, and Thomas test: Positive, Ely's Test Positive, Ober's Test Negative   FUNCTIONAL TESTS:  None performed   DIRECTIONAL PREFERENCE: Flexion   GAIT: Distance walked: 40 ft  Assistive device utilized: None Level of assistance: Complete Independence Comments: Decreased stride length with no other abnormalities noted     TODAY'S TREATMENT  LTR 1 x 5 with 3 sec hold Forward Flexion with Butterfly 2 x 30 sec      PATIENT EDUCATION:  Education details: form and technique for appropriate exercise Person educated: Patient Education method: Explanation, Demonstration, Verbal cues, and Handouts Education comprehension: verbalized understanding, returned demonstration, verbal cues required, and tactile cues required   HOME EXERCISE PROGRAM: Access Code: [email or web address] URL: [email or web address] Date: 06/27/2022 Prepared by: Ellin Goodie  Exercises - Supine Lower Trunk Rotation  -  1 x daily - 7 x weekly - 3 sets - 10 reps - 3 hold - Butterfly Groin  Stretch  - 1 x daily - 7 x weekly - 1 sets - 3 reps - 30 hold  ASSESSMENT:  CLINICAL IMPRESSION: Patient is a 66 y.o. white female who was seen today for physical therapy evaluation and treatment for chronic low back pain. She exhibits moderate to low pain with stable symptoms and moderate disability that place her in the movement control low back pain treatment classification. She shows deficits that include decreased hip flexibility and hip strength along with increased low back pain. She will benefit from skilled PT to improve aforementioned deficits to retu   OBJECTIVE IMPAIRMENTS Abnormal gait, decreased endurance, difficulty walking, decreased ROM, decreased strength, hypomobility, increased muscle spasms, impaired flexibility, obesity, and pain.   ACTIVITY LIMITATIONS carrying, lifting, bending, standing, squatting, sleeping, stairs, and locomotion level  PARTICIPATION LIMITATIONS: meal prep, cleaning, shopping, and yard work  PERSONAL FACTORS Age, Fitness, Past/current experiences, Sex, Time since onset of injury/illness/exacerbation, and 3+ comorbidities: depression, h/o tobacco use, and chronicity of LBP  are also affecting patient's functional outcome.   REHAB POTENTIAL: Fair chronicity of low back pain  CLINICAL DECISION MAKING: Stable/uncomplicated  EVALUATION COMPLEXITY: Low   GOALS: Goals reviewed with patient? No  SHORT TERM GOALS: Target date: 07/11/2022  Pt will be independent with HEP in order to improve strength and balance in order to decrease fall risk and improve function at home and work. Baseline: NT  Goal status: INITIAL  2.  Pt will decrease 5TSTS by at least 3 seconds in order to demonstrate clinically significant improvement in LE strength. Baseline: NT   Goal status: INITIAL   LONG TERM GOALS: Target date: 08/22/2022  Patient will have improved function and activity level as evidenced by an increase in FOTO score by 10 points or more.  Baseline: 40  with target of 51. Goal status: INITIAL  2.  Patient will improve hip strength by 1/2 MMT to offload forces biomechanical forces on spine to improve low back pain symptoms.  Baseline: Hip Strength R/L Ext 4/4 Add 3+/3+ Abd 4/4  Goal status: INITIAL  3.  Patient will be able to maintain single leg stance for 10 sec on RLE and LLE for improved hip abductor strength to offload forces biomechanical forces on spine to improve low back pain symptoms.  Baseline: NT  Goal status: INITIAL  4.  Patient will perform >=12 chair stands within 30 sec to exhibit LE endurance that is within her age and gender matched norms and to show that she is not at risk of falling because of reduce LE endurance.   Baseline: NT  Goal status: INITIAL    PLAN: PT FREQUENCY: 1-2x/week  PT DURATION: 8 weeks  PLANNED INTERVENTIONS: Therapeutic exercises, Therapeutic activity, Neuromuscular re-education, Balance training, Gait training, Patient/Family education, Joint mobilization, Cryotherapy, Moist heat, Manual therapy, Dry Needling, and Re-evaluation.  PLAN FOR NEXT SESSION: SLS, 30 sec chair stands, 5 x STS, Progress LE stretching and strengthening exercises   Bradly Chris PT, DPT  06/27/2022, 11:38 AM

## 2022-06-28 ENCOUNTER — Encounter (INDEPENDENT_AMBULATORY_CARE_PROVIDER_SITE_OTHER): Payer: Self-pay | Admitting: Vascular Surgery

## 2022-06-28 ENCOUNTER — Ambulatory Visit (INDEPENDENT_AMBULATORY_CARE_PROVIDER_SITE_OTHER): Payer: 59 | Admitting: Vascular Surgery

## 2022-06-28 ENCOUNTER — Ambulatory Visit: Payer: 59 | Admitting: Physical Therapy

## 2022-06-28 VITALS — BP 161/80 | HR 76 | Resp 17 | Ht 61.0 in | Wt 169.0 lb

## 2022-06-28 DIAGNOSIS — I708 Atherosclerosis of other arteries: Secondary | ICD-10-CM

## 2022-06-28 DIAGNOSIS — I700 Atherosclerosis of aorta: Secondary | ICD-10-CM

## 2022-06-28 DIAGNOSIS — I10 Essential (primary) hypertension: Secondary | ICD-10-CM

## 2022-06-28 DIAGNOSIS — I513 Intracardiac thrombosis, not elsewhere classified: Secondary | ICD-10-CM | POA: Insufficient documentation

## 2022-06-28 DIAGNOSIS — I219 Acute myocardial infarction, unspecified: Secondary | ICD-10-CM | POA: Insufficient documentation

## 2022-06-28 NOTE — Assessment & Plan Note (Signed)
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  She has done very well.  Her right fifth toe remains discolored but is improved and there is no skin breakdown and this appears like it will likely be viable.  All other toes are back to normal.  She has stopped smoking.  She will return in 3 months with an aortoiliac duplex and ABIs.  Continue current medical regimen.

## 2022-06-28 NOTE — Progress Notes (Signed)
Patient ID: Rachel Hahn, female   DOB: 1956-06-13, 66 y.o.   MRN: [id number]  Chief Complaint  Patient presents with   Establish Care    Seen by Dr Wyn Quaker in ED    HPI Rachel Hahn is a 66 y.o. female.  Patient returns in follow-up about 3 weeks after aortoiliac stent graft placement for aortic ulcer with marked mural thrombosis and atheroembolization to her toes.  She is doing great.  Her right fifth toe is still discolored but no longer painful and the color is much better than it was previously.  All other toes that were marginal appearing before the procedure have pinked up and look good.  She is not having much pain.  She is recovering well.  She had postoperative bruising at her access sites but this was typical and is improving.  She has stopped smoking and is congratulated on this.   Past Medical History:  Diagnosis Date   Anxiety    Depression    Hypertension    Kidney stones    Mitral valve prolapse     Past Surgical History:  Procedure Laterality Date   AORTIC INTERVENTION N/A 06/08/2022   Procedure: AORTIC INTERVENTION;  Surgeon: Annice Needy, MD;  Location: [facility] INVASIVE CV LAB;  Service: Cardiovascular;  Laterality: N/A;   KIDNEY CYST REMOVAL     LITHOTRIPSY     TONSILLECTOMY        Allergies  Allergen Reactions   Nitrofurantoin Hives   Nsaids Shortness Of Breath   Prednisone Other (See Comments)    Makes her feel "woot-woot", she can tolerate Symbicort.   Codeine Nausea And Vomiting   Ibuprofen Other (See Comments)    Shortness of breath/chest pain   Morphine And Related Nausea And Vomiting   Sudafed [Pseudoephedrine Hcl] Other (See Comments)    Chest pain/shortness of breath    Current Outpatient Medications  Medication Sig Dispense Refill   acetaminophen (TYLENOL) 500 MG tablet Tylenol Extra Strength 500 mg tablet     aspirin EC 81 MG tablet Take 1 tablet (81 mg total) by mouth daily at 6 (six) AM. Swallow whole. 30 tablet 12   atenolol (TENORMIN)  100 MG tablet Take 100 mg by mouth daily.     budesonide-formoterol (SYMBICORT) 160-4.5 MCG/ACT inhaler Inhale 2 puffs into the lungs 2 (two) times daily.     Cholecalciferol (VITAMIN D3) 1.25 MG (50000 UT) CAPS Vitamin D3     clopidogrel (PLAVIX) 75 MG tablet Take 1 tablet (75 mg total) by mouth daily at 6 (six) AM. 30 tablet 0   losartan (COZAAR) 25 MG tablet Take 1 tablet (25 mg total) by mouth daily. 30 tablet 0   pantoprazole (PROTONIX) 40 MG tablet Take 1 tablet (40 mg total) by mouth daily. 30 tablet 0   rosuvastatin (CRESTOR) 40 MG tablet Take 1 tablet (40 mg total) by mouth daily. 30 tablet 0   sertraline (ZOLOFT) 100 MG tablet Take 100 mg by mouth daily.     baclofen (LIORESAL) 10 MG tablet Take 1 tablet by mouth daily. (Patient not taking: Reported on 06/28/2022)     gabapentin (NEURONTIN) 100 MG capsule Take 1 capsule by mouth daily. (Patient not taking: Reported on 06/28/2022)     No current facility-administered medications for this visit.        Physical Exam BP (!) 161/80 (BP Location: Right Arm)   Pulse 76   Resp 17   Ht 5\' 1"  (1.549 m)   Wt  169 lb (76.7 kg)   BMI 31.93 kg/m  Gen:  WD/WN, NAD Skin: incision C/D/I.  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  She has done very well.  Her right fifth toe remains discolored but is improved and there is no skin breakdown and this appears like it will likely be viable.  All other toes are back to normal.  She has stopped smoking.  She will return in 3 months with an aortoiliac duplex and ABIs.  Continue current medical regimen.  Primary hypertension blood pressure control important in reducing the progression of atherosclerotic disease. On appropriate oral  medications.      [name] 06/28/2022, 10:00 AM   This note was created with Dragon medical transcription system.  Any errors from dictation are unintentional.

## 2022-06-28 NOTE — Assessment & Plan Note (Signed)
blood pressure control important in reducing the progression of atherosclerotic disease. On appropriate oral medications.  

## 2022-06-30 ENCOUNTER — Ambulatory Visit
Admission: RE | Admit: 2022-06-30 | Discharge: 2022-06-30 | Disposition: A | Discharge status: Home / Self Care | Payer: 59 | Source: Ambulatory Visit | Attending: Physical Medicine & Rehabilitation | Admitting: Physical Medicine & Rehabilitation

## 2022-06-30 ENCOUNTER — Ambulatory Visit: Payer: 59 | Admitting: Physical Therapy

## 2022-06-30 DIAGNOSIS — G8929 Other chronic pain: Secondary | ICD-10-CM

## 2022-06-30 DIAGNOSIS — M6281 Muscle weakness (generalized): Secondary | ICD-10-CM | POA: Diagnosis not present

## 2022-06-30 DIAGNOSIS — M545 Low back pain, unspecified: Secondary | ICD-10-CM | POA: Diagnosis not present

## 2022-06-30 DIAGNOSIS — M47816 Spondylosis without myelopathy or radiculopathy, lumbar region: Secondary | ICD-10-CM | POA: Diagnosis not present

## 2022-06-30 DIAGNOSIS — M5459 Other low back pain: Secondary | ICD-10-CM | POA: Diagnosis not present

## 2022-06-30 NOTE — Therapy (Signed)
OUTPATIENT PHYSICAL THERAPY TREATMENT NOTE   Patient Name: Rachel Hahn MRN: [id number] DOB:1955-12-30, 66 y.o., female Today's Date: 06/30/2022  PCP: [facility]  REFERRING PROVIDER: Dr. Filomena Jungling   END OF SESSION:   PT End of Session - 06/30/22 0812     Visit Number 2    Number of Visits 16    Date for PT Re-Evaluation 08/22/22    PT Start Time 0810    PT Stop Time 0848    PT Time Calculation (min) 38 min    Activity Tolerance Patient tolerated treatment well    Behavior During Therapy [facility] for tasks assessed/performed             Past Medical History:  Diagnosis Date   Anxiety    Depression    Hypertension    Kidney stones    Mitral valve prolapse    Past Surgical History:  Procedure Laterality Date   AORTIC INTERVENTION N/A 06/08/2022   Procedure: AORTIC INTERVENTION;  Surgeon: Annice Needy, MD;  Location: [facility] INVASIVE CV LAB;  Service: Cardiovascular;  Laterality: N/A;   KIDNEY CYST REMOVAL     LITHOTRIPSY     TONSILLECTOMY     Patient Active Problem List   Diagnosis Date Noted   Mural thrombus of atrium (HCC) 06/28/2022   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) 06/28/2022   Peripheral arterial occlusive disease (HCC) 06/03/2022   GERD (gastroesophageal reflux disease) 06/03/2022   Depression 06/03/2022   Primary hypertension 06/03/2022   Tobacco dependence 06/03/2022   Lateral antebrachial cutaneous nerve injury, sequela (Right) 10/06/2021   Right forearm pain 10/06/2021   Nerve entrapment syndrome of arm (Right) 10/06/2021   Chronic pain syndrome 10/05/2021   Pharmacologic therapy 10/05/2021   Disorder of skeletal system 10/05/2021   Problems influencing health status 10/05/2021   Encounter for chronic pain management 10/05/2021   Chronic upper extremity pain (1ry area of Pain) (Right) 03/05/2021   Chest pain 05/17/2017   Accelerated hypertension 05/17/2017    REFERRING DIAG:  M54.42,M54.41,G89.29 (ICD-10-CM) - Chronic bilateral low back pain with bilateral sciatica  THERAPY DIAG:  Other low back pain  Rationale for Evaluation and Treatment Rehabilitation  PERTINENT HISTORY:                         Per note from Dr. Lyn Hollingshead on 06/09/22 Chronic pain syndrome History of chronic low back pain.  PRECAUTIONS: None   SUBJECTIVE: Pt reports feeling about the same pain wise as last session.  PAIN:  Are you having pain? Yes: NPRS scale: 3/10 Pain location: Low back pain that radiates from around C8 spinous process to low back  Pain description: Spasm with sudden onset  Aggravating factors: Not sure what causes the pain. It happens all of a sudden.  Relieving factors: Nothing really helps   OBJECTIVE:                         VITALS: BP 137/79 HR 64 SpO2 96   DIAGNOSTIC FINDINGS:  MRI scheduled and awaiting results    PATIENT SURVEYS:  FOTO 40/100 with target score of 51    SCREENING FOR RED FLAGS: Bowel or bladder incontinence: No Spinal tumors: No Cauda equina syndrome: No Compression fracture: No Abdominal aneurysm: No   COGNITION:           Overall cognitive status: Within functional limits for tasks assessed  SENSATION: WFL   MUSCLE LENGTH: Hamstrings: Right 80 deg; Left 80 deg Thomas test: Right 20 deg; Left 20 deg   POSTURE: rounded shoulders   PALPATION: TTP over L1-L4 spinous processes    LUMBAR ROM:    Active  A/PROM  eval  Flexion 90%*  Extension 90%*  Right lateral flexion 100%  Left lateral flexion 100%  Right rotation 100%  Left rotation 100%   (Blank rows = not tested)   LOWER EXTREMITY ROM:        Active  Right 06/27/22 Left 06/27/22   Hip flexion 120 120  Hip extension 30 30  Hip abduction 45 45  Hip adduction 30 30  Hip internal rotation 45 45  Hip external rotation 45 45  Knee flexion 135 135  Knee extension 0 0  Ankle dorsiflexion 20 20  Ankle plantarflexion 50 50  Ankle  inversion 35 35  Ankle eversion 15 15   (Blank rows = not tested)        LOWER EXTREMITY MMT:     MMT Right eval Left eval  Hip flexion 5 5  Hip extension 4 4  Hip abduction 4 4  Hip adduction 3+ 3+  Hip internal rotation 5 5  Hip external rotation 5 5  Knee flexion 5 5  Knee extension 5 5  Ankle dorsiflexion 5 5  Ankle plantarflexion      Ankle inversion      Ankle eversion       (Blank rows = not tested)   LUMBAR SPECIAL TESTS:  Straight leg raise test: Negative, FABER test: Negative, and Thomas test: Positive, Ely's Test Positive, Ober's Test Negative    FUNCTIONAL TESTS:  None performed    DIRECTIONAL PREFERENCE: Flexion    GAIT: Distance walked: 40 ft  Assistive device utilized: None Level of assistance: Complete Independence Comments: Decreased stride length with no other abnormalities noted        TODAY'S TREATMENT   06/30/22:             Nu-Step level 5 for arms and feet with resistance 1- 5 min              Lower Trunk Rotations 2 x 10             Supine Bridges 2 X 10              -Pt reports a large increase in pain of lower back              Pelvic Tilt with bilateral leg lift march with hold 3 x 10                           5 x STS: 18 sec              30 chair stands: 9 reps               SLS R/L: 3 sec/ 3 sec               Semi-tandem Horiz Head Turns 2 x 10             Semi-tandem Vert Head Turns 2 x 10              Semi-tandem eyes closed 2 x 10 sec              Tandem closed 2 x 10 sec     Initial  LTR 1 x 5 with 3 sec hold Forward Flexion with Butterfly 2 x 30 sec        PATIENT EDUCATION:  Education details: form and technique for appropriate exercise Person educated: Patient Education method: Explanation, Demonstration, Verbal cues, and Handouts Education comprehension: verbalized understanding, returned demonstration, verbal cues required, and tactile cues required     HOME EXERCISE PROGRAM: Access Code: [email or web address] URL:  [email or web address] Date: 06/30/2022 Prepared by: Ellin Goodie  Exercises - Supine Lower Trunk Rotation  - 1 x daily - 7 x weekly - 3 sets - 10 reps - 3 hold - Butterfly Groin Stretch  - 1 x daily - 7 x weekly - 1 sets - 3 reps - 30 hold - Sit to Stand  - 1 x daily - 3 x weekly - 3 sets - 10 reps - Supine Double Bent Leg Lift  - 1 x daily - 3 x weekly - 3 sets - 10 reps - Half Tandem Stance Balance with Eyes Closed  - 1 x daily - 7 x weekly - 1 sets - 5 reps - 10 hold   ASSESSMENT:   CLINICAL IMPRESSION:  Pt exhibits decreased LE strength and endurance that places her at an increased risk for falls. She was able to complete all exercises with the exception of bridges which likely is because extension aggravates her stenosis. All future hip strengthening exercises should be modified for flexion preference. She will continue to benefit from skilled PT to improve aforementioned deficits to return to carrying out ADLs with less difficulty.     OBJECTIVE IMPAIRMENTS Abnormal gait, decreased endurance, difficulty walking, decreased ROM, decreased strength, hypomobility, increased muscle spasms, impaired flexibility, obesity, and pain.    ACTIVITY LIMITATIONS carrying, lifting, bending, standing, squatting, sleeping, stairs, and locomotion level   PARTICIPATION LIMITATIONS: meal prep, cleaning, shopping, and yard work   PERSONAL FACTORS Age, Fitness, Past/current experiences, Sex, Time since onset of injury/illness/exacerbation, and 3+ comorbidities: depression, h/o tobacco use, and chronicity of LBP  are also affecting patient's functional outcome.    REHAB POTENTIAL: Fair chronicity of low back pain   CLINICAL DECISION MAKING: Stable/uncomplicated   EVALUATION COMPLEXITY: Low     GOALS: Goals reviewed with patient? No   SHORT TERM GOALS: Target date: 07/11/2022   Pt will be independent with HEP in order to improve strength and balance in order to decrease fall risk  and improve function at home and work. Baseline: NT  Goal status: INITIAL   2.  Pt will decrease 5TSTS by at least 3 seconds in order to demonstrate clinically significant improvement in LE strength. Baseline: 18 sec  Goal status: ONGOING      LONG TERM GOALS: Target date: 08/22/2022   Patient will have improved function and activity level as evidenced by an increase in FOTO score by 10 points or more.  Baseline: 40 with target of 51. Goal status: ONGOING    2.  Patient will improve hip strength by 1/2 MMT to offload forces biomechanical forces on spine to improve low back pain symptoms.  Baseline: Hip Strength R/L Ext 4/4 Add 3+/3+ Abd 4/4  Goal status: ONGOING    3.  Patient will be able to maintain single leg stance for 10 sec on RLE and LLE for improved hip abductor strength to offload forces biomechanical forces on spine to improve low back pain symptoms.  Baseline: NT  Goal status: ONGOING    4.  Patient will perform >=12 chair stands within 30 sec to  exhibit LE endurance that is within her age and gender matched norms and to show that she is not at risk of falling because of reduce LE endurance.   Baseline:  9 REPS Goal status: ONGOING        PLAN: PT FREQUENCY: 1-2x/week   PT DURATION: 8 weeks   PLANNED INTERVENTIONS: Therapeutic exercises, Therapeutic activity, Neuromuscular re-education, Balance training, Gait training, Patient/Family education, Joint mobilization, Cryotherapy, Moist heat, Manual therapy, Dry Needling, and Re-evaluation.   PLAN FOR NEXT SESSION:  Progress LE stretching and strengthening exercises    Ellin Goodie PT, DPT  Johnn Hai, PT 06/30/2022, 8:14 AM

## 2022-07-04 ENCOUNTER — Telehealth: Payer: Self-pay | Admitting: Physical Therapy

## 2022-07-04 ENCOUNTER — Ambulatory Visit: Payer: 59 | Admitting: Physical Therapy

## 2022-07-04 NOTE — Telephone Encounter (Signed)
Called pt to inquiry about absence. Pt did not respond so left VM instructing pt to call back to check in.

## 2022-07-06 ENCOUNTER — Encounter: Payer: 59 | Admitting: Physical Therapy

## 2022-07-06 ENCOUNTER — Encounter: Payer: Self-pay | Admitting: Physical Therapy

## 2022-07-06 ENCOUNTER — Ambulatory Visit: Payer: 59 | Admitting: Physical Therapy

## 2022-07-06 ENCOUNTER — Other Ambulatory Visit (HOSPITAL_BASED_OUTPATIENT_CLINIC_OR_DEPARTMENT_OTHER): Payer: Self-pay | Admitting: Osteopathic Medicine

## 2022-07-06 DIAGNOSIS — M5459 Other low back pain: Secondary | ICD-10-CM | POA: Diagnosis not present

## 2022-07-06 DIAGNOSIS — M6281 Muscle weakness (generalized): Secondary | ICD-10-CM | POA: Diagnosis not present

## 2022-07-06 NOTE — Therapy (Signed)
OUTPATIENT PHYSICAL THERAPY TREATMENT NOTE   Patient Name: Rachel Hahn MRN: [id number] DOB:[birth year], 66 y.o., female Today's Date: 07/06/2022  PCP: [facility]  REFERRING PROVIDER: Dr. Filomena Jungling   END OF SESSION:   PT End of Session - 07/06/22 1238     Visit Number 3    Number of Visits 16    Date for PT Re-Evaluation 08/22/22    PT Start Time 1235    PT Stop Time 1315    PT Time Calculation (min) 40 min    Activity Tolerance Patient tolerated treatment well    Behavior During Therapy [facility] for tasks assessed/performed             Past Medical History:  Diagnosis Date   Anxiety    Depression    Hypertension    Kidney stones    Mitral valve prolapse    Past Surgical History:  Procedure Laterality Date   AORTIC INTERVENTION N/A 06/08/2022   Procedure: AORTIC INTERVENTION;  Surgeon: Annice Needy, MD;  Location: [facility] INVASIVE CV LAB;  Service: Cardiovascular;  Laterality: N/A;   KIDNEY CYST REMOVAL     LITHOTRIPSY     TONSILLECTOMY     Patient Active Problem List   Diagnosis Date Noted   Mural thrombus of atrium (HCC) 06/28/2022   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) 06/28/2022   Peripheral arterial occlusive disease (HCC) 06/03/2022   GERD (gastroesophageal reflux disease) 06/03/2022   Depression 06/03/2022   Primary hypertension 06/03/2022   Tobacco dependence 06/03/2022   Lateral antebrachial cutaneous nerve injury, sequela (Right) 10/06/2021   Right forearm pain 10/06/2021   Nerve entrapment syndrome of arm (Right) 10/06/2021   Chronic pain syndrome 10/05/2021   Pharmacologic therapy 10/05/2021   Disorder of skeletal system 10/05/2021   Problems influencing health status 10/05/2021   Encounter for chronic pain management 10/05/2021   Chronic upper extremity pain (1ry area of Pain) (Right) 03/05/2021   Chest pain 05/17/2017   Accelerated hypertension 05/17/2017    REFERRING DIAG:  M54.42,M54.41,G89.29 (ICD-10-CM) - Chronic bilateral low back pain with bilateral sciatica  THERAPY DIAG:  Other low back pain  Muscle weakness (generalized)  Rationale for Evaluation and Treatment Rehabilitation  PERTINENT HISTORY:                         Per note from Dr. Lyn Hollingshead on 06/09/22 Chronic pain syndrome History of chronic low back pain.  PRECAUTIONS: None   SUBJECTIVE: Pt reports feeling less back pain at beginning of session and that her exercises have been going well.   PAIN:  Are you having pain? Yes: NPRS scale: 3/10 Pain location: Low back pain that radiates from around C8 spinous process to low back  Pain description: Spasm with sudden onset  Aggravating factors: Not sure what causes the pain. It happens all of a sudden.  Relieving factors: Nothing really helps   OBJECTIVE:                         VITALS: BP 137/79 HR 64 SpO2 96   DIAGNOSTIC FINDINGS:  CLINICAL DATA:  Provided history: Chronic bilateral low back pain with bilateral sciatica. Additional history provided by scanning technologist: Patient reports low back pain for 6 months, weakness.   EXAM: MRI LUMBAR SPINE WITHOUT CONTRAST   TECHNIQUE: Multiplanar, multisequence MR imaging of the lumbar spine was performed. No intravenous contrast was administered.   COMPARISON:  CT angiogram of the abdominal aorta with iliofemoral runoff 06/03/2022.   FINDINGS: Mild intermittent motion degradation.   Segmentation: Five lumbar vertebrae. The caudal-most well-formed intervertebral disc space is designated L5-S1.   Alignment: Slight dextrocurvature of the thoracolumbar spine, centered at the L2-L3 level. Minimal L2-L3 and L3-L4 grade 1 retrolisthesis.   Vertebrae: Vertebral body height is maintained. Mild degenerative endplate edema at H4-L9. Small multilevel vertebral body hemangiomas.   Conus medullaris and cauda equina: Conus extends to the L2 level. No signal abnormality within the  visualized distal spinal cord.   Paraspinal and other soft tissues: Bilateral renal atrophy. Bilateral nephrolithiasis, better appreciated on the recent prior CT angiogram of 06/03/2022. Left renal cysts.   Disc levels:   Mild-to-moderate disc degeneration at L2-L3. No more than mild disc degeneration at the remaining levels.   T12-L1: No significant disc herniation or stenosis.   L1-L2: No significant disc herniation or stenosis.   L2-L3: Small disc bulge. No significant spinal canal or foraminal stenosis.   L3-L4: Small disc bulge. No significant spinal canal or foraminal stenosis.   L4-L5: Small disc bulge. Mild ligamentum flavum hypertrophy on the left. Mild left subarticular narrowing with som slight e crowding of the descending left L5 nerve root (series 15, image 28). No significant central canal stenosis or neural foraminal narrowing.   L5-S1: Minimal facet arthrosis. No significant disc herniation or stenosis.   IMPRESSION: Lumbar spondylosis, as outlined.   At L4-L5, there is multifactorial mild left subarticular narrowing with slight crowding of the descending left L5 nerve root.   No significant spinal canal or foraminal stenosis at the remaining levels.   Mild degenerative endplate edema at F7-T0.   Mild thoracolumbar dextrocurvature, possibly positional.   Slight L2-L3 and L3-L4 grade 1 retrolisthesis.     Electronically Signed   By: Jackey Loge D.O.   On: 07/02/2022 18:19   PATIENT SURVEYS:  FOTO 40/100 with target score of 51    SCREENING FOR RED FLAGS: Bowel or bladder incontinence: No Spinal tumors: No Cauda equina syndrome: No Compression fracture: No Abdominal aneurysm: No   COGNITION:           Overall cognitive status: Within functional limits for tasks assessed                          SENSATION: WFL   MUSCLE LENGTH: Hamstrings: Right 80 deg; Left 80 deg Thomas test: Right 20 deg; Left 20 deg   POSTURE: rounded shoulders    PALPATION: TTP over L1-L4 spinous processes    LUMBAR ROM:    Active  A/PROM  eval  Flexion 90%*  Extension 90%*  Right lateral flexion 100%  Left lateral flexion 100%  Right rotation 100%  Left rotation 100%   (Blank rows = not tested)   LOWER EXTREMITY ROM:        Active  Right 06/27/22 Left 06/27/22   Hip flexion 120 120  Hip extension 30 30  Hip abduction 45 45  Hip adduction 30 30  Hip internal rotation 45 45  Hip external rotation 45 45  Knee flexion 135 135  Knee extension 0 0  Ankle dorsiflexion 20 20  Ankle plantarflexion 50 50  Ankle inversion 35 35  Ankle eversion 15 15   (Blank rows = not tested)        LOWER EXTREMITY MMT:     MMT Right eval Left eval  Hip flexion 5 5  Hip extension  4 4  Hip abduction 4 4  Hip adduction 3+ 3+  Hip internal rotation 5 5  Hip external rotation 5 5  Knee flexion 5 5  Knee extension 5 5  Ankle dorsiflexion 5 5  Ankle plantarflexion      Ankle inversion      Ankle eversion       (Blank rows = not tested)   LUMBAR SPECIAL TESTS:  Straight leg raise test: Negative, FABER test: Negative, and Thomas test: Positive, Ely's Test Positive, Ober's Test Negative    FUNCTIONAL TESTS:  None performed    DIRECTIONAL PREFERENCE: Flexion    GAIT: Distance walked: 40 ft  Assistive device utilized: None Level of assistance: Complete Independence Comments: Decreased stride length with no other abnormalities noted        TODAY'S TREATMENT   07/06/22:              Nu-Step level 5 for arms and feet with resistance 2- 5 min               Sit to Stand without UE support 2 x 10               Standing Hip Abduction with BUE 2 x 10              -Pt self-terminated due to pain               Side Lying Hip Abduction 3 x 10               Lower Trunk Rotation 3 x 10                 Reviewed MRI imagine of lumbar spine and provided pt with a definition of spondylosis:                [email or web address]                  06/30/22:             Nu-Step level 5 for arms and feet with resistance 1- 5 min              Lower Trunk Rotations 2 x 10             Supine Bridges 2 X 10              -Pt reports a large increase in pain of lower back              Pelvic Tilt with bilateral leg lift march with hold 3 x 10                           5 x STS: 18 sec              30 chair stands: 9 reps               SLS R/L: 3 sec/ 3 sec               Semi-tandem Horiz Head Turns 2 x 10             Semi-tandem Vert Head Turns 2 x 10              Semi-tandem eyes closed 2 x 10 sec              Tandem closed 2 x 10 sec     Initial  LTR  1 x 5 with 3 sec hold Forward Flexion with Butterfly 2 x 30 sec        PATIENT EDUCATION:  Education details: form and technique for appropriate exercise Person educated: Patient Education method: Explanation, Demonstration, Verbal cues, and Handouts Education comprehension: verbalized understanding, returned demonstration, verbal cues required, and tactile cues required     HOME EXERCISE PROGRAM: Access Code: [email or web address] URL: [email or web address] Date: 07/06/2022 Prepared by: Bradly Chris  Exercises - Supine Lower Trunk Rotation  - 1 x daily - 7 x weekly - 3 sets - 10 reps - 3 hold - Butterfly Groin Stretch  - 1 x daily - 7 x weekly - 1 sets - 3 reps - 30 hold - Sit to Stand  - 1 x daily - 3 x weekly - 3 sets - 10 reps - Supine Double Bent Leg Lift  - 1 x daily - 3 x weekly - 3 sets - 10 reps - Half Tandem Stance Balance with Eyes Closed  - 1 x daily - 7 x weekly - 1 sets - 5 reps - 10 hold - Sidelying Hip Abduction  - 1 x daily - 3 x weekly - 3 sets - 10 reps - Supine Bridge with Mini Swiss Ball Between Knees  - 1 x daily - 3 x weekly - 3 sets - 10 reps  ASSESSMENT:   CLINICAL IMPRESSION:  Pt exhibits improved LE strength with ability to perform  side lying hip abduction and sit to stands without UE support. PT provided pt education on spondylosis and connection to symptoms. Exercises modified to non-aggravating pain positions for increased compliance from standing to side lying for hip abduction. She will continue to benefit from skilled PT to improve aforementioned deficits to return to carrying out ADLs with less difficulty.  OBJECTIVE IMPAIRMENTS Abnormal gait, decreased endurance, difficulty walking, decreased ROM, decreased strength, hypomobility, increased muscle spasms, impaired flexibility, obesity, and pain.    ACTIVITY LIMITATIONS carrying, lifting, bending, standing, squatting, sleeping, stairs, and locomotion level   PARTICIPATION LIMITATIONS: meal prep, cleaning, shopping, and yard work   PERSONAL FACTORS Age, Fitness, Past/current experiences, Sex, Time since onset of injury/illness/exacerbation, and 3+ comorbidities: depression, h/o tobacco use, and chronicity of LBP  are also affecting patient's functional outcome.    REHAB POTENTIAL: Fair chronicity of low back pain   CLINICAL DECISION MAKING: Stable/uncomplicated   EVALUATION COMPLEXITY: Low     GOALS: Goals reviewed with patient? No   SHORT TERM GOALS: Target date: 07/11/2022   Pt will be independent with HEP in order to improve strength and balance in order to decrease fall risk and improve function at home and work. Baseline: NT  Goal status: ONGOING    2.  Pt will decrease 5TSTS by at least 3 seconds in order to demonstrate clinically significant improvement in LE strength. Baseline: 18 sec  Goal status: ONGOING      LONG TERM GOALS: Target date: 08/22/2022   Patient will have improved function and activity level as evidenced by an increase in FOTO score by 10 points or more.  Baseline: 40 with target of 51. Goal status: ONGOING    2.  Patient will improve hip strength by 1/2 MMT to offload forces biomechanical forces on spine to improve low back pain  symptoms.  Baseline: Hip Strength R/L Ext 4/4 Add 3+/3+ Abd 4/4  Goal status: ONGOING    3.  Patient will be able to maintain single leg stance for 10 sec on RLE and LLE for  improved hip abductor strength to offload forces biomechanical forces on spine to improve low back pain symptoms.  Baseline: NT  Goal status: ONGOING    4.  Patient will perform >=12 chair stands within 30 sec to exhibit LE endurance that is within her age and gender matched norms and to show that she is not at risk of falling because of reduce LE endurance.   Baseline:  9 REPS Goal status: ONGOING        PLAN: PT FREQUENCY: 1-2x/week   PT DURATION: 8 weeks   PLANNED INTERVENTIONS: Therapeutic exercises, Therapeutic activity, Neuromuscular re-education, Balance training, Gait training, Patient/Family education, Joint mobilization, Cryotherapy, Moist heat, Manual therapy, Dry Needling, and Re-evaluation.   PLAN FOR NEXT SESSION:  Progress LE stretching and strengthening exercises    Bradly Chris PT, DPT  Daneil Dan, PT 07/06/2022, 12:40 PM

## 2022-07-12 ENCOUNTER — Ambulatory Visit: Payer: 59 | Admitting: Physical Therapy

## 2022-07-12 ENCOUNTER — Telehealth: Payer: Self-pay | Admitting: Physical Therapy

## 2022-07-12 NOTE — Telephone Encounter (Signed)
Called to inquiry about absence from apt. Pt did not pickup so left VM instructing pt to call back to check in.

## 2022-07-14 ENCOUNTER — Telehealth: Payer: Self-pay | Admitting: Physical Therapy

## 2022-07-14 ENCOUNTER — Ambulatory Visit: Payer: 59 | Admitting: Physical Therapy

## 2022-07-14 NOTE — Telephone Encounter (Signed)
Called to inquiry about absence from PT session. Pt did not respond and could not leave VM because VM was full. Pt has now missed two appointments in a row and if she no shows again she will be removed from schedule.

## 2022-07-15 ENCOUNTER — Other Ambulatory Visit (HOSPITAL_BASED_OUTPATIENT_CLINIC_OR_DEPARTMENT_OTHER): Payer: Self-pay | Admitting: Osteopathic Medicine

## 2022-07-18 ENCOUNTER — Telehealth: Payer: Self-pay | Admitting: Physical Therapy

## 2022-07-18 ENCOUNTER — Ambulatory Visit: Payer: 59 | Admitting: Physical Therapy

## 2022-07-18 NOTE — Telephone Encounter (Addendum)
Attempted to call patient, but did not reach and could not leave VM because VM was full. This is pt's third no show, so she has been removed from the schedule.

## 2022-07-19 ENCOUNTER — Other Ambulatory Visit (HOSPITAL_BASED_OUTPATIENT_CLINIC_OR_DEPARTMENT_OTHER): Payer: Self-pay | Admitting: Osteopathic Medicine

## 2022-07-20 ENCOUNTER — Encounter: Payer: 59 | Admitting: Physical Therapy

## 2022-07-25 ENCOUNTER — Encounter: Payer: 59 | Admitting: Physical Therapy

## 2022-07-28 ENCOUNTER — Encounter: Payer: 59 | Admitting: Physical Therapy

## 2022-08-02 ENCOUNTER — Encounter: Payer: 59 | Admitting: Physical Therapy

## 2022-08-04 ENCOUNTER — Encounter: Payer: 59 | Admitting: Physical Therapy

## 2022-08-05 ENCOUNTER — Other Ambulatory Visit (HOSPITAL_BASED_OUTPATIENT_CLINIC_OR_DEPARTMENT_OTHER): Payer: Self-pay | Admitting: Osteopathic Medicine

## 2022-08-09 ENCOUNTER — Encounter: Payer: 59 | Admitting: Physical Therapy

## 2022-08-11 ENCOUNTER — Encounter: Payer: 59 | Admitting: Physical Therapy

## 2022-08-14 ENCOUNTER — Other Ambulatory Visit: Payer: Self-pay | Admitting: Internal Medicine

## 2022-08-16 ENCOUNTER — Encounter: Payer: 59 | Admitting: Physical Therapy

## 2022-08-18 ENCOUNTER — Encounter: Payer: 59 | Admitting: Physical Therapy

## 2022-08-23 DIAGNOSIS — I739 Peripheral vascular disease, unspecified: Secondary | ICD-10-CM | POA: Diagnosis not present

## 2022-08-23 DIAGNOSIS — R0789 Other chest pain: Secondary | ICD-10-CM | POA: Diagnosis not present

## 2022-08-23 DIAGNOSIS — M8589 Other specified disorders of bone density and structure, multiple sites: Secondary | ICD-10-CM | POA: Diagnosis not present

## 2022-08-23 DIAGNOSIS — E782 Mixed hyperlipidemia: Secondary | ICD-10-CM | POA: Diagnosis not present

## 2022-08-23 DIAGNOSIS — J449 Chronic obstructive pulmonary disease, unspecified: Secondary | ICD-10-CM | POA: Diagnosis not present

## 2022-08-23 DIAGNOSIS — R69 Illness, unspecified: Secondary | ICD-10-CM | POA: Diagnosis not present

## 2022-08-23 DIAGNOSIS — I10 Essential (primary) hypertension: Secondary | ICD-10-CM | POA: Diagnosis not present

## 2022-08-23 DIAGNOSIS — R7302 Impaired glucose tolerance (oral): Secondary | ICD-10-CM | POA: Diagnosis not present

## 2022-08-24 ENCOUNTER — Other Ambulatory Visit: Payer: Self-pay | Admitting: Internal Medicine

## 2022-08-24 DIAGNOSIS — Z1231 Encounter for screening mammogram for malignant neoplasm of breast: Secondary | ICD-10-CM

## 2022-09-01 DIAGNOSIS — R0789 Other chest pain: Secondary | ICD-10-CM | POA: Diagnosis not present

## 2022-09-06 DIAGNOSIS — J449 Chronic obstructive pulmonary disease, unspecified: Secondary | ICD-10-CM | POA: Diagnosis not present

## 2022-09-06 DIAGNOSIS — R0602 Shortness of breath: Secondary | ICD-10-CM | POA: Diagnosis not present

## 2022-09-06 DIAGNOSIS — R079 Chest pain, unspecified: Secondary | ICD-10-CM | POA: Diagnosis not present

## 2022-09-06 DIAGNOSIS — E782 Mixed hyperlipidemia: Secondary | ICD-10-CM | POA: Diagnosis not present

## 2022-09-06 DIAGNOSIS — I10 Essential (primary) hypertension: Secondary | ICD-10-CM | POA: Diagnosis not present

## 2022-09-06 DIAGNOSIS — J45998 Other asthma: Secondary | ICD-10-CM | POA: Diagnosis not present

## 2022-09-06 DIAGNOSIS — I739 Peripheral vascular disease, unspecified: Secondary | ICD-10-CM | POA: Diagnosis not present

## 2022-09-12 DIAGNOSIS — R69 Illness, unspecified: Secondary | ICD-10-CM | POA: Diagnosis not present

## 2022-09-23 DIAGNOSIS — Z0000 Encounter for general adult medical examination without abnormal findings: Secondary | ICD-10-CM | POA: Diagnosis not present

## 2022-09-23 DIAGNOSIS — K5909 Other constipation: Secondary | ICD-10-CM | POA: Diagnosis not present

## 2022-09-23 DIAGNOSIS — E782 Mixed hyperlipidemia: Secondary | ICD-10-CM | POA: Diagnosis not present

## 2022-09-23 DIAGNOSIS — I10 Essential (primary) hypertension: Secondary | ICD-10-CM | POA: Diagnosis not present

## 2022-09-23 DIAGNOSIS — I2510 Atherosclerotic heart disease of native coronary artery without angina pectoris: Secondary | ICD-10-CM | POA: Diagnosis not present

## 2022-09-23 DIAGNOSIS — K219 Gastro-esophageal reflux disease without esophagitis: Secondary | ICD-10-CM | POA: Diagnosis not present

## 2022-09-27 ENCOUNTER — Ambulatory Visit (INDEPENDENT_AMBULATORY_CARE_PROVIDER_SITE_OTHER): Payer: 59 | Admitting: Nurse Practitioner

## 2022-09-27 ENCOUNTER — Other Ambulatory Visit (INDEPENDENT_AMBULATORY_CARE_PROVIDER_SITE_OTHER): Payer: 59

## 2022-09-27 ENCOUNTER — Encounter (INDEPENDENT_AMBULATORY_CARE_PROVIDER_SITE_OTHER): Payer: 59

## 2022-10-06 DIAGNOSIS — R079 Chest pain, unspecified: Secondary | ICD-10-CM | POA: Diagnosis not present

## 2022-10-06 DIAGNOSIS — I10 Essential (primary) hypertension: Secondary | ICD-10-CM | POA: Diagnosis not present

## 2022-10-06 DIAGNOSIS — K219 Gastro-esophageal reflux disease without esophagitis: Secondary | ICD-10-CM | POA: Diagnosis not present

## 2022-10-06 DIAGNOSIS — R0602 Shortness of breath: Secondary | ICD-10-CM | POA: Diagnosis not present

## 2022-10-06 DIAGNOSIS — I739 Peripheral vascular disease, unspecified: Secondary | ICD-10-CM | POA: Diagnosis not present

## 2022-10-06 DIAGNOSIS — J449 Chronic obstructive pulmonary disease, unspecified: Secondary | ICD-10-CM | POA: Diagnosis not present

## 2022-10-06 DIAGNOSIS — E782 Mixed hyperlipidemia: Secondary | ICD-10-CM | POA: Diagnosis not present

## 2022-10-06 DIAGNOSIS — J45998 Other asthma: Secondary | ICD-10-CM | POA: Diagnosis not present

## 2022-10-17 ENCOUNTER — Encounter (INDEPENDENT_AMBULATORY_CARE_PROVIDER_SITE_OTHER): Payer: Self-pay

## 2022-10-18 DIAGNOSIS — K219 Gastro-esophageal reflux disease without esophagitis: Secondary | ICD-10-CM | POA: Diagnosis not present

## 2022-10-18 DIAGNOSIS — J449 Chronic obstructive pulmonary disease, unspecified: Secondary | ICD-10-CM | POA: Diagnosis not present

## 2022-10-18 DIAGNOSIS — I10 Essential (primary) hypertension: Secondary | ICD-10-CM | POA: Diagnosis not present

## 2022-10-18 DIAGNOSIS — I739 Peripheral vascular disease, unspecified: Secondary | ICD-10-CM | POA: Diagnosis not present

## 2022-10-18 DIAGNOSIS — E782 Mixed hyperlipidemia: Secondary | ICD-10-CM | POA: Diagnosis not present

## 2022-10-18 DIAGNOSIS — R079 Chest pain, unspecified: Secondary | ICD-10-CM | POA: Diagnosis not present

## 2022-10-18 DIAGNOSIS — J45998 Other asthma: Secondary | ICD-10-CM | POA: Diagnosis not present

## 2022-12-27 DIAGNOSIS — J449 Chronic obstructive pulmonary disease, unspecified: Secondary | ICD-10-CM | POA: Diagnosis not present

## 2022-12-27 DIAGNOSIS — J441 Chronic obstructive pulmonary disease with (acute) exacerbation: Secondary | ICD-10-CM | POA: Diagnosis not present

## 2022-12-27 DIAGNOSIS — I10 Essential (primary) hypertension: Secondary | ICD-10-CM | POA: Diagnosis not present

## 2022-12-27 DIAGNOSIS — E782 Mixed hyperlipidemia: Secondary | ICD-10-CM | POA: Diagnosis not present

## 2022-12-27 DIAGNOSIS — J4530 Mild persistent asthma, uncomplicated: Secondary | ICD-10-CM | POA: Diagnosis not present

## 2022-12-27 DIAGNOSIS — R7302 Impaired glucose tolerance (oral): Secondary | ICD-10-CM | POA: Diagnosis not present

## 2023-01-09 DIAGNOSIS — J449 Chronic obstructive pulmonary disease, unspecified: Secondary | ICD-10-CM | POA: Diagnosis not present

## 2023-01-10 DIAGNOSIS — R7302 Impaired glucose tolerance (oral): Secondary | ICD-10-CM | POA: Diagnosis not present

## 2023-01-10 DIAGNOSIS — J449 Chronic obstructive pulmonary disease, unspecified: Secondary | ICD-10-CM | POA: Diagnosis not present

## 2023-01-10 DIAGNOSIS — E782 Mixed hyperlipidemia: Secondary | ICD-10-CM | POA: Diagnosis not present

## 2023-01-10 DIAGNOSIS — I10 Essential (primary) hypertension: Secondary | ICD-10-CM | POA: Diagnosis not present

## 2023-01-16 ENCOUNTER — Emergency Department
Admission: EM | Admit: 2023-01-16 | Discharge: 2023-01-16 | Disposition: A | Discharge status: Home / Self Care | Payer: Medicare HMO | Attending: Emergency Medicine | Admitting: Emergency Medicine

## 2023-01-16 ENCOUNTER — Emergency Department: Payer: Medicare HMO

## 2023-01-16 ENCOUNTER — Encounter: Payer: Self-pay | Admitting: Emergency Medicine

## 2023-01-16 ENCOUNTER — Other Ambulatory Visit: Payer: Self-pay

## 2023-01-16 DIAGNOSIS — R519 Headache, unspecified: Secondary | ICD-10-CM | POA: Diagnosis not present

## 2023-01-16 DIAGNOSIS — N3000 Acute cystitis without hematuria: Secondary | ICD-10-CM | POA: Diagnosis not present

## 2023-01-16 DIAGNOSIS — Z20822 Contact with and (suspected) exposure to covid-19: Secondary | ICD-10-CM | POA: Diagnosis not present

## 2023-01-16 DIAGNOSIS — I10 Essential (primary) hypertension: Secondary | ICD-10-CM | POA: Diagnosis not present

## 2023-01-16 DIAGNOSIS — R1111 Vomiting without nausea: Secondary | ICD-10-CM | POA: Diagnosis not present

## 2023-01-16 DIAGNOSIS — Z743 Need for continuous supervision: Secondary | ICD-10-CM | POA: Diagnosis not present

## 2023-01-16 DIAGNOSIS — A0811 Acute gastroenteropathy due to Norwalk agent: Secondary | ICD-10-CM | POA: Diagnosis not present

## 2023-01-16 DIAGNOSIS — R112 Nausea with vomiting, unspecified: Secondary | ICD-10-CM | POA: Diagnosis present

## 2023-01-16 DIAGNOSIS — R61 Generalized hyperhidrosis: Secondary | ICD-10-CM | POA: Diagnosis not present

## 2023-01-16 DIAGNOSIS — R42 Dizziness and giddiness: Secondary | ICD-10-CM | POA: Diagnosis not present

## 2023-01-16 DIAGNOSIS — R110 Nausea: Secondary | ICD-10-CM | POA: Diagnosis not present

## 2023-01-16 LAB — BASIC METABOLIC PANEL
Anion gap: 7 (ref 5–15)
BUN: 28 mg/dL — ABNORMAL HIGH (ref 8–23)
CO2: 23 mmol/L (ref 22–32)
Calcium: 8.8 mg/dL — ABNORMAL LOW (ref 8.9–10.3)
Chloride: 106 mmol/L (ref 98–111)
Creatinine, Ser: 1.42 mg/dL — ABNORMAL HIGH (ref 0.44–1.00)
GFR, Estimated: 41 mL/min — ABNORMAL LOW (ref >60)
Glucose, Bld: 202 mg/dL — ABNORMAL HIGH (ref 70–99)
Potassium: 4.6 mmol/L (ref 3.5–5.1)
Sodium: 136 mmol/L (ref 135–145)

## 2023-01-16 LAB — URINALYSIS, ROUTINE W REFLEX MICROSCOPIC
Bilirubin Urine: NEGATIVE
Glucose, UA: NEGATIVE mg/dL
Ketones, ur: NEGATIVE mg/dL
Nitrite: POSITIVE — AB
Protein, ur: 100 mg/dL — AB
Specific Gravity, Urine: 1.013 (ref 1.005–1.030)
WBC, UA: >50 WBC/hpf (ref 0–5)
pH: 6 (ref 5.0–8.0)

## 2023-01-16 LAB — HEPATIC FUNCTION PANEL
ALT: 17 U/L (ref 0–44)
AST: 23 U/L (ref 15–41)
Albumin: 3.6 g/dL (ref 3.5–5.0)
Alkaline Phosphatase: 99 U/L (ref 38–126)
Bilirubin, Direct: <0.1 mg/dL (ref 0.0–0.2)
Total Bilirubin: 0.5 mg/dL (ref 0.3–1.2)
Total Protein: 7 g/dL (ref 6.5–8.1)

## 2023-01-16 LAB — RESP PANEL BY RT-PCR (RSV, FLU A&B, COVID)  RVPGX2
Influenza A by PCR: NEGATIVE
Influenza B by PCR: NEGATIVE
Resp Syncytial Virus by PCR: NEGATIVE
SARS Coronavirus 2 by RT PCR: NEGATIVE

## 2023-01-16 LAB — CBC
HCT: 47 % — ABNORMAL HIGH (ref 36.0–46.0)
Hemoglobin: 14.6 g/dL (ref 12.0–15.0)
MCH: 28 pg (ref 26.0–34.0)
MCHC: 31.1 g/dL (ref 30.0–36.0)
MCV: 90 fL (ref 80.0–100.0)
Platelets: 213 10*3/uL (ref 150–400)
RBC: 5.22 MIL/uL — ABNORMAL HIGH (ref 3.87–5.11)
RDW: 14 % (ref 11.5–15.5)
WBC: 12.1 10*3/uL — ABNORMAL HIGH (ref 4.0–10.5)
nRBC: 0 % (ref 0.0–0.2)

## 2023-01-16 LAB — TROPONIN I (HIGH SENSITIVITY)
Troponin I (High Sensitivity): 4 ng/L (ref <18)
Troponin I (High Sensitivity): 5 ng/L (ref <18)

## 2023-01-16 LAB — LIPASE, BLOOD: Lipase: 38 U/L (ref 11–51)

## 2023-01-16 MED ORDER — ACETAMINOPHEN 325 MG PO TABS
650.0000 mg | ORAL_TABLET | Freq: Once | ORAL | Status: AC
  Administered 2023-01-16: 650 mg via ORAL
  Filled 2023-01-16: qty 2

## 2023-01-16 MED ORDER — DICYCLOMINE HCL 10 MG PO CAPS: 10.0000 mg | ORAL_CAPSULE | Freq: Three times a day (TID) | ORAL | 0 refills | Status: DC

## 2023-01-16 MED ORDER — ONDANSETRON 8 MG PO TBDP
8.0000 mg | ORAL_TABLET | Freq: Once | ORAL | Status: AC
  Administered 2023-01-16: 8 mg via ORAL
  Filled 2023-01-16: qty 1

## 2023-01-16 MED ORDER — LOPERAMIDE HCL 2 MG PO TABS: 2.0000 mg | ORAL_TABLET | Freq: Four times a day (QID) | ORAL | 0 refills | Status: DC | PRN

## 2023-01-16 MED ORDER — CEPHALEXIN 500 MG PO CAPS: 500.0000 mg | ORAL_CAPSULE | Freq: Four times a day (QID) | ORAL | 0 refills | Status: DC

## 2023-01-16 MED ORDER — SODIUM CHLORIDE 0.9 % IV SOLN
12.5000 mg | Freq: Four times a day (QID) | INTRAVENOUS | Status: DC | PRN
  Administered 2023-01-16: 12.5 mg via INTRAVENOUS
  Filled 2023-01-16: qty 0.5

## 2023-01-16 MED ORDER — PROMETHAZINE HCL 25 MG PO TABS
12.5000 mg | ORAL_TABLET | Freq: Once | ORAL | Status: AC
  Administered 2023-01-16: 12.5 mg via ORAL
  Filled 2023-01-16: qty 1

## 2023-01-16 MED ORDER — PROMETHAZINE HCL 12.5 MG PO TABS: 12.5000 mg | ORAL_TABLET | Freq: Four times a day (QID) | ORAL | 0 refills | Status: DC | PRN

## 2023-01-16 NOTE — ED Notes (Signed)
Patient verbalizes understanding of discharge instructions. Opportunity for questioning and answers were provided. Armband removed by staff, pt discharged from ED. Wheeled out to lobby, assisted into car with husband

## 2023-01-16 NOTE — ED Triage Notes (Signed)
Presents via EMS   States she was at [employer] dizzy  and then diaphoretic  Positive n/v

## 2023-01-16 NOTE — ED Provider Notes (Addendum)
[facility] Provider Note  Patient Contact: 6:09 PM (approximate)   History   Dizziness   HPI  Rachel Hahn is a 67 y.o. female who presents the emergency department complaining of sudden onset of headache, nausea, body aches, vomiting.  Patient states that she was at lunch, had sat down, had just started to eat when she had this overwhelming wave of nausea followed by emesis.  Patient has chronic diarrhea, no change earlier today.  She has had no fever, chills, URI congestion or cough.  Patient states that she does have a headache at this time.  She states that it is a mild headache but did start at the same time nausea and vomiting began.  She is also endorsed some mild diffuse bodyaches.     Physical Exam   Triage Vital Signs: ED Triage Vitals  Enc Vitals Group     BP 01/16/23 1548 (!) 168/87     Pulse Rate 01/16/23 1548 79     Resp 01/16/23 1548 17     Temp 01/16/23 1548 98.1 F (36.7 C)     Temp Source 01/16/23 1548 Oral     SpO2 01/16/23 1548 98 %     Weight 01/16/23 1438 166 lb 14.2 oz (75.7 kg)     Height 01/16/23 1438 5\' 1"  (1.549 m)     Head Circumference --      Peak Flow --      Pain Score 01/16/23 1438 0     Pain Loc --      Pain Edu? --      Excl. in GC? --     Most recent vital signs: Vitals:   01/16/23 1548 01/16/23 1858  BP: (!) 168/87 139/68  Pulse: 79 82  Resp: 17 16  Temp: 98.1 F (36.7 C)   SpO2: 98% 96%     General: Alert and in no acute distress. Eyes:  PERRL. EOMI. ENT:      Ears:       Nose: No congestion/rhinnorhea.      Mouth/Throat: Mucous membranes are moist. Neck: No stridor. No cervical spine tenderness to palpation. Hematological/Lymphatic/Immunilogical: No cervical lymphadenopathy.* Cardiovascular:  Good peripheral perfusion Respiratory: Normal respiratory effort without tachypnea or retractions. Lungs CTAB. Good air entry to the bases with no decreased or absent breath sounds Gastrointestinal:  Bowel sounds 4 quadrants. Soft and nontender to palpation. No guarding or rigidity. No palpable masses. No distention. No CVA tenderness. Musculoskeletal: Full range of motion to all extremities.  Neurologic:  No gross focal neurologic deficits are appreciated.  Skin:   No rash noted Other:   ED Results / Procedures / Treatments   Labs (all labs ordered are listed, but only abnormal results are displayed) Labs Reviewed  BASIC METABOLIC PANEL - Abnormal; Notable for the following components:      Result Value   Glucose, Bld 202 (*)    BUN 28 (*)    Creatinine, Ser 1.42 (*)    Calcium 8.8 (*)    GFR, Estimated 41 (*)    All other components within normal limits  CBC - Abnormal; Notable for the following components:   WBC 12.1 (*)    RBC 5.22 (*)    HCT 47.0 (*)    All other components within normal limits  URINALYSIS, ROUTINE W REFLEX MICROSCOPIC - Abnormal; Notable for the following components:   Color, Urine YELLOW (*)    APPearance CLOUDY (*)    Hgb urine dipstick MODERATE (*)  Protein, ur 100 (*)    Nitrite POSITIVE (*)    Leukocytes,Ua LARGE (*)    Bacteria, UA RARE (*)    All other components within normal limits  RESP PANEL BY RT-PCR (RSV, FLU A&B, COVID)  [id number]  HEPATIC FUNCTION PANEL  LIPASE, BLOOD  TROPONIN I (HIGH SENSITIVITY)  TROPONIN I (HIGH SENSITIVITY)     EKG  ED ECG REPORT I, Charline Bills Sherryl Valido,  personally viewed and interpreted this ECG.   Date: 01/16/2023  EKG Time: 1552 hrs.  Rate: 76 bpm  Rhythm: normal sinus rhythm  Axis: Normal axis  Intervals:none  ST&T Change: No ST elevation or depression noted  No STEMI.  Normal sinus rhythm.  Nonspecific T wave changes in the anterolateral leads    RADIOLOGY  I personally viewed, evaluated, and interpreted these images as part of my medical decision making, as well as reviewing the written report by the radiologist.  ED Provider Interpretation: CT without intracranial abnormality  CT  Head Wo Contrast  Result Date: 01/16/2023 CLINICAL DATA:  New onset headaches and dizziness, initial encounter EXAM: CT HEAD WITHOUT CONTRAST TECHNIQUE: Contiguous axial images were obtained from the base of the skull through the vertex without intravenous contrast. RADIATION DOSE REDUCTION: This exam was performed according to the departmental dose-optimization program which includes automated exposure control, adjustment of the mA and/or kV according to patient size and/or use of iterative reconstruction technique. COMPARISON:  None Available. FINDINGS: Brain: No evidence of acute infarction, hemorrhage, hydrocephalus, extra-axial collection or mass lesion/mass effect. Vascular: No hyperdense vessel or unexpected calcification. Skull: Normal. Negative for fracture or focal lesion. Sinuses/Orbits: No acute finding. Other: None. IMPRESSION: No acute intracranial abnormality noted. Electronically Signed   By: Inez Catalina M.D.   On: 01/16/2023 18:52    PROCEDURES:  Critical Care performed: No  Procedures   MEDICATIONS ORDERED IN ED: Medications  acetaminophen (TYLENOL) tablet 650 mg (has no administration in time range)  promethazine (PHENERGAN) 12.5 mg in sodium chloride 0.9 % 50 mL IVPB (0 mg Intravenous Stopped 01/16/23 2244)  promethazine (PHENERGAN) tablet 12.5 mg (has no administration in time range)  ondansetron (ZOFRAN-ODT) disintegrating tablet 8 mg (8 mg Oral Given 01/16/23 1829)     IMPRESSION / MDM / ASSESSMENT AND PLAN / ED COURSE  I reviewed the triage vital signs and the nursing notes.                                 Differential diagnosis includes, but is not limited to, norovirus, CVA, intracranial hemorrhage, STEMI, gastritis, colitis, cholecystitis  Patient's presentation is most consistent with acute presentation with potential threat to life or bodily function.   Patient's diagnosis is consistent with norovirus, UTI.  Patient presents emergency department with sudden  onset of bodyaches, headache, nausea and vomiting.  Patient has chronic diarrhea already.  Patient was feeling fine this morning, sudden onset.  Did endorse a headache so CT scan was ordered with no evidence of intracranial hemorrhage.  Labs, EKG are reassuring.  At this time I suspect norovirus given the prevalence in the community, onset of symptoms with headache, body aches, nausea vomiting.  However patient also has findings on urinalysis concerning for UTI.  There is no elevated white blood cell count, temperature, signs of sepsis.  As such I do not feel that the sudden nature of symptoms was likely secondary to her UTI.  However I will prescribe antibiotics for  coverage.  At this time concerning signs and symptoms and return precautions are discussed with the patient.  She will be prescribed Phenergan, loperamide and Bentyl and Keflex.  Follow-up primary care as needed.  Again return precautions discussed with the patient and her husband..  Patient is given ED precautions to return to the ED for any worsening or new symptoms.     FINAL CLINICAL IMPRESSION(S) / ED DIAGNOSES   Final diagnoses:  Norovirus  Acute cystitis without hematuria     Rx / DC Orders   ED Discharge Orders          Ordered    promethazine (PHENERGAN) 12.5 MG tablet  Every 6 hours PRN        01/16/23 2331    dicyclomine (BENTYL) 10 MG capsule  3 times daily before meals & bedtime        01/16/23 2331    loperamide (IMODIUM A-D) 2 MG tablet  4 times daily PRN        01/16/23 2331    cephALEXin (KEFLEX) 500 MG capsule  4 times daily        01/16/23 2334             Note:  This document was prepared using Dragon voice recognition software and may include unintentional dictation errors.   Darletta Moll, PA-C 01/16/23 2333    Darletta Moll, PA-C 01/16/23 2336    Harvest Dark, MD 01/23/23 1354

## 2023-01-24 ENCOUNTER — Ambulatory Visit: Payer: Medicare HMO | Admitting: Internal Medicine

## 2023-01-25 ENCOUNTER — Other Ambulatory Visit: Payer: Self-pay | Admitting: Internal Medicine

## 2023-01-25 DIAGNOSIS — M8589 Other specified disorders of bone density and structure, multiple sites: Secondary | ICD-10-CM

## 2023-02-21 ENCOUNTER — Ambulatory Visit (INDEPENDENT_AMBULATORY_CARE_PROVIDER_SITE_OTHER): Payer: Medicare HMO

## 2023-02-21 DIAGNOSIS — M8589 Other specified disorders of bone density and structure, multiple sites: Secondary | ICD-10-CM | POA: Diagnosis not present

## 2023-02-24 ENCOUNTER — Other Ambulatory Visit: Payer: Self-pay | Admitting: Internal Medicine

## 2023-02-24 DIAGNOSIS — M810 Age-related osteoporosis without current pathological fracture: Secondary | ICD-10-CM

## 2023-02-24 MED ORDER — ALENDRONATE SODIUM 70 MG PO TABS: 70.0000 mg | ORAL_TABLET | ORAL | 3 refills | Status: DC

## 2023-03-22 ENCOUNTER — Emergency Department: Payer: Medicare HMO

## 2023-03-22 ENCOUNTER — Emergency Department
Admission: EM | Admit: 2023-03-22 | Discharge: 2023-03-22 | Disposition: A | Discharge status: Home / Self Care | Payer: Medicare HMO | Attending: Emergency Medicine | Admitting: Emergency Medicine

## 2023-03-22 ENCOUNTER — Other Ambulatory Visit: Payer: Self-pay

## 2023-03-22 DIAGNOSIS — Y93K1 Activity, walking an animal: Secondary | ICD-10-CM | POA: Diagnosis not present

## 2023-03-22 DIAGNOSIS — Z7982 Long term (current) use of aspirin: Secondary | ICD-10-CM | POA: Insufficient documentation

## 2023-03-22 DIAGNOSIS — M25511 Pain in right shoulder: Secondary | ICD-10-CM | POA: Diagnosis not present

## 2023-03-22 DIAGNOSIS — W010XXA Fall on same level from slipping, tripping and stumbling without subsequent striking against object, initial encounter: Secondary | ICD-10-CM | POA: Insufficient documentation

## 2023-03-22 DIAGNOSIS — S42201A Unspecified fracture of upper end of right humerus, initial encounter for closed fracture: Secondary | ICD-10-CM | POA: Insufficient documentation

## 2023-03-22 DIAGNOSIS — Z7901 Long term (current) use of anticoagulants: Secondary | ICD-10-CM | POA: Diagnosis not present

## 2023-03-22 DIAGNOSIS — I10 Essential (primary) hypertension: Secondary | ICD-10-CM | POA: Insufficient documentation

## 2023-03-22 HISTORY — DX: Unspecified fracture of upper end of right humerus, initial encounter for closed fracture: S42.201A

## 2023-03-22 MED ORDER — OXYCODONE HCL 5 MG PO TABS
5.0000 mg | ORAL_TABLET | Freq: Once | ORAL | Status: AC
  Administered 2023-03-22: 5 mg via ORAL
  Filled 2023-03-22: qty 1

## 2023-03-22 MED ORDER — OXYCODONE HCL 5 MG PO TABS: 5.0000 mg | ORAL_TABLET | Freq: Four times a day (QID) | ORAL | 0 refills | Status: DC | PRN

## 2023-03-22 NOTE — Discharge Instructions (Addendum)
Call make an appoint with Dr. Leim Fabry who is on-call for orthopedics.  His contact information and address are listed on your discharge papers.  Wear the shoulder immobilizer, ice and elevation.  Be aware that the pain medication could cause drowsiness and increase your risk for falling.  This medication was sent to your pharmacy.

## 2023-03-22 NOTE — ED Triage Notes (Signed)
Pt states she was walking a dog and the dog chased a cat and when doing so, pt landed on her right shoulder. 10/10 shoulder pain to the right.  Pt denies hitting head or loss of consciousness. Pt states she is on plavix and an aspirin

## 2023-03-22 NOTE — ED Provider Notes (Signed)
[facility] Provider Note    Event Date/Time   First MD Initiated Contact with Patient 03/22/23 1256     (approximate)   History   Fall   HPI  Rachel Hahn is a 67 y.o. female   presents to the ED with complaint of right shoulder pain.  Patient states she was walking the dog when the dog pulled her down and attempt to chase the cat.  Patient landed on her elbow.  She denies any head injury or loss of consciousness during this fall.  She has continued to be ambulatory since the accident.  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      Physical Exam   Triage Vital Signs: ED Triage Vitals  Enc Vitals Group     BP 03/22/23 1113 120/84     Pulse Rate 03/22/23 1113 79     Resp 03/22/23 1113 18     Temp 03/22/23 1113 98.1 F (36.7 C)     Temp src --      SpO2 03/22/23 1113 92 %     Weight 03/22/23 1115 173 lb (78.5 kg)     Height 03/22/23 1115 5\' 1"  (1.549 m)     Head Circumference --      Peak Flow --      Pain Score 03/22/23 1114 10     Pain Loc --      Pain Edu? --      Excl. in [place]? --     Most recent vital signs: Vitals:   03/22/23 1113  BP: 120/84  Pulse: 79  Resp: 18  Temp: 98.1 F (36.7 C)  SpO2: 92%     General: Awake, no distress.  Alert, oriented, talkative. CV:  Good peripheral perfusion.  Heart regular rate and rhythm. Resp:  Normal effort.  Lungs are clear bilaterally. Abd:  No distention.  Other:  Right shoulder no gross deformity however is markedly tender on palpation proximal humerus and range of motion is markedly restricted secondary to increased pain.  Skin is intact.  Radial pulse present.  Motor and sensory function intact.   ED Results / Procedures / Treatments   Labs (all labs ordered are listed, but only abnormal results are displayed) Labs Reviewed - No data to  display    RADIOLOGY  Right shoulder x-ray images were reviewed and interpreted by myself independent of the radiologist and shows a fracture of the proximal humerus, fracture through the surgical neck.   PROCEDURES:  Critical Care performed:   Procedures   MEDICATIONS ORDERED IN ED: Medications  oxyCODONE (Oxy IR/ROXICODONE) immediate release tablet 5 mg (has no administration in time range)     IMPRESSION / MDM / ASSESSMENT AND PLAN / ED COURSE  I reviewed the triage vital signs and the nursing notes.   Differential diagnosis includes, but is not limited to, right shoulder fracture, humeral fracture, contusion secondary to fall.  67 year old female presents to the ED with complaint of right shoulder pain after she fell while walking her dog.  X-ray was reviewed patient was made aware that she does have a fracture of the proximal humerus.  She was placed in a shoulder immobilizer with instructions to follow-up with Dr. Leim Fabry who is on-call for orthopedics.  A oxycodone IR 5 mg was given to her while in the ED with a  prescription going to her pharmacy.  She is instructed to ice and elevate to help with swelling.      Patient's presentation is most consistent with acute complicated illness / injury requiring diagnostic workup.  FINAL CLINICAL IMPRESSION(S) / ED DIAGNOSES   Final diagnoses:  Closed fracture of proximal end of right humerus, unspecified fracture morphology, initial encounter     Rx / DC Orders   ED Discharge Orders          Ordered    oxyCODONE (OXY IR/ROXICODONE) 5 MG immediate release tablet  Every 6 hours PRN        03/22/23 1315             Note:  This document was prepared using Dragon voice recognition software and may include unintentional dictation errors.   Johnn Hai, PA-C 03/22/23 1317    Carrie Mew, MD 03/22/23 1550

## 2023-03-27 ENCOUNTER — Telehealth: Payer: Self-pay

## 2023-03-27 NOTE — Telephone Encounter (Signed)
        Patient  visited [place] on 4/3    Telephone encounter attempt :   2nd  Unable to Leave Message    Lenard For[facility], [facility] [phone] [address] [place], [place], [place] [address] Phone: [phone] Email: [email or web address]

## 2023-03-27 NOTE — Telephone Encounter (Signed)
        Patient  visited [place] on 4/3    Telephone encounter attempt :  1st  A HIPAA compliant voice message was left requesting a return call.  Instructed patient to call back .    [facility], [place] [phone] [address], [place], [place] [address] Phone: [phone] Email: [email or web address]       

## 2023-03-29 ENCOUNTER — Ambulatory Visit
Admission: RE | Admit: 2023-03-29 | Discharge: 2023-03-29 | Disposition: A | Discharge status: Home / Self Care | Payer: Medicare HMO | Source: Ambulatory Visit | Attending: Orthopedic Surgery | Admitting: Orthopedic Surgery

## 2023-03-29 ENCOUNTER — Other Ambulatory Visit: Payer: Self-pay | Admitting: Orthopedic Surgery

## 2023-03-29 DIAGNOSIS — S42201A Unspecified fracture of upper end of right humerus, initial encounter for closed fracture: Secondary | ICD-10-CM | POA: Diagnosis not present

## 2023-03-29 DIAGNOSIS — R937 Abnormal findings on diagnostic imaging of other parts of musculoskeletal system: Secondary | ICD-10-CM | POA: Diagnosis not present

## 2023-03-29 DIAGNOSIS — Z6833 Body mass index (BMI) 33.0-33.9, adult: Secondary | ICD-10-CM | POA: Diagnosis not present

## 2023-03-29 DIAGNOSIS — S42211A Unspecified displaced fracture of surgical neck of right humerus, initial encounter for closed fracture: Secondary | ICD-10-CM | POA: Diagnosis not present

## 2023-03-30 DIAGNOSIS — S42201A Unspecified fracture of upper end of right humerus, initial encounter for closed fracture: Secondary | ICD-10-CM | POA: Diagnosis not present

## 2023-04-20 DIAGNOSIS — S42201A Unspecified fracture of upper end of right humerus, initial encounter for closed fracture: Secondary | ICD-10-CM | POA: Diagnosis not present

## 2023-05-11 ENCOUNTER — Other Ambulatory Visit: Payer: Self-pay | Admitting: Cardiovascular Disease

## 2023-05-15 ENCOUNTER — Other Ambulatory Visit: Payer: Self-pay | Admitting: Internal Medicine

## 2023-05-15 DIAGNOSIS — F3289 Other specified depressive episodes: Secondary | ICD-10-CM

## 2023-05-18 DIAGNOSIS — S42201A Unspecified fracture of upper end of right humerus, initial encounter for closed fracture: Secondary | ICD-10-CM | POA: Diagnosis not present

## 2023-05-23 DIAGNOSIS — M25511 Pain in right shoulder: Secondary | ICD-10-CM | POA: Diagnosis not present

## 2023-05-23 DIAGNOSIS — G8929 Other chronic pain: Secondary | ICD-10-CM | POA: Diagnosis not present

## 2023-05-31 DIAGNOSIS — G8929 Other chronic pain: Secondary | ICD-10-CM | POA: Diagnosis not present

## 2023-05-31 DIAGNOSIS — M25511 Pain in right shoulder: Secondary | ICD-10-CM | POA: Diagnosis not present

## 2023-06-16 ENCOUNTER — Telehealth: Payer: Self-pay

## 2023-06-16 NOTE — Telephone Encounter (Signed)
Patient Rachel Hahn asking for a zpack to be sent to her pharmacy please advise

## 2023-06-19 ENCOUNTER — Encounter: Payer: Self-pay | Admitting: Internal Medicine

## 2023-06-19 ENCOUNTER — Ambulatory Visit (INDEPENDENT_AMBULATORY_CARE_PROVIDER_SITE_OTHER): Payer: Medicare HMO | Admitting: Internal Medicine

## 2023-06-19 VITALS — BP 118/82 | HR 81 | Ht 61.0 in | Wt 172.0 lb

## 2023-06-19 DIAGNOSIS — I779 Disorder of arteries and arterioles, unspecified: Secondary | ICD-10-CM | POA: Diagnosis not present

## 2023-06-19 DIAGNOSIS — I10 Essential (primary) hypertension: Secondary | ICD-10-CM | POA: Diagnosis not present

## 2023-06-19 DIAGNOSIS — E782 Mixed hyperlipidemia: Secondary | ICD-10-CM | POA: Diagnosis not present

## 2023-06-19 DIAGNOSIS — M810 Age-related osteoporosis without current pathological fracture: Secondary | ICD-10-CM | POA: Insufficient documentation

## 2023-06-19 DIAGNOSIS — J441 Chronic obstructive pulmonary disease with (acute) exacerbation: Secondary | ICD-10-CM

## 2023-06-19 MED ORDER — METHYLPREDNISOLONE 4 MG PO TBPK: ORAL_TABLET | ORAL | 0 refills | Status: DC

## 2023-06-19 MED ORDER — AZITHROMYCIN 250 MG PO TABS: ORAL_TABLET | ORAL | 0 refills | Status: AC

## 2023-06-19 MED ORDER — ALENDRONATE SODIUM 70 MG PO TABS: 70.0000 mg | ORAL_TABLET | ORAL | 3 refills | Status: DC

## 2023-06-19 NOTE — Progress Notes (Signed)
Established Patient Office Visit  Subjective:  Patient ID: Rachel Hahn, female    DOB: [birth year]  Age: 67 y.o. MRN: [id number]  Chief Complaint  Patient presents with   Cough    Cough for over a week    Patient comes in with complaints of chest congestion, tightness, difficulty breathing and a cough.  Patient has not been in the office for several months.  She recently had fracture of her right humerus.  She is supposed to wear a sling and wait for it to heal.  May eventually need surgery.  However she currently is having difficulty with her COPD exacerbation.  Denies any fevers or chills.  No chest pain.  She occasionally produces sputum which is clear.  Denies nausea vomiting or diarrhea.Symbicort inhaler is not helping much.  Cough Associated symptoms include shortness of breath. Pertinent negatives include no chest pain, chills, fever, headaches, heartburn, myalgias or sore throat.    No other concerns at this time.   Past Medical History:  Diagnosis Date   Anxiety    Depression    Hypertension    Kidney stones    Mitral valve prolapse     Past Surgical History:  Procedure Laterality Date   AORTIC INTERVENTION N/A 06/08/2022   Procedure: AORTIC INTERVENTION;  Surgeon: Annice Needy, MD;  Location: [facility] INVASIVE CV LAB;  Service: Cardiovascular;  Laterality: N/A;   KIDNEY CYST REMOVAL     LITHOTRIPSY     TONSILLECTOMY      Social History   Socioeconomic History   Marital status: Single    Spouse name: Not on file   Number of children: Not on file   Years of education: Not on file   Highest education level: Not on file  Occupational History   Not on file  Tobacco Use   Smoking status: Every Day    Packs/day: 1.00    Years: 20.00    Additional pack years: 0.00    Total pack years: 20.00    Types: Cigarettes   Smokeless tobacco: Never  Substance and Sexual Activity   Alcohol use: No   Drug use: No   Sexual activity: Not Currently  Other Topics Concern    Not on file  Social History Narrative   Not on file   Social Determinants of Health   Financial Resource Strain: Not on file  Food Insecurity: Not on file  Transportation Needs: Not on file  Physical Activity: Not on file  Stress: Not on file  Social Connections: Not on file  Intimate Partner Violence: Not on file    Family History  Problem Relation Age of Onset   Hypertension Other     Allergies  Allergen Reactions   Nitrofurantoin Hives   Nsaids Shortness Of Breath   Prednisone Other (See Comments)    Makes her feel "woot-woot", she can tolerate Symbicort.   Codeine Nausea And Vomiting   Ibuprofen Other (See Comments)    Shortness of breath/chest pain   Morphine And Codeine Nausea And Vomiting   Sudafed [Pseudoephedrine Hcl] Other (See Comments)    Chest pain/shortness of breath    Review of Systems  Constitutional:  Positive for malaise/fatigue. Negative for chills and fever.  HENT: Negative.  Negative for sinus pain and sore throat.   Eyes: Negative.   Respiratory:  Positive for cough, sputum production and shortness of breath. Negative for stridor.   Cardiovascular: Negative.  Negative for chest pain, palpitations and leg swelling.  Gastrointestinal: Negative.  Negative for abdominal pain, constipation, diarrhea, heartburn, nausea and vomiting.  Genitourinary: Negative.  Negative for dysuria and flank pain.  Musculoskeletal: Negative.  Negative for joint pain and myalgias.  Skin: Negative.   Neurological: Negative.  Negative for dizziness and headaches.  Endo/Heme/Allergies: Negative.   Psychiatric/Behavioral: Negative.  Negative for depression and suicidal ideas. The patient is not nervous/anxious.        Objective:   BP 118/82   Pulse 81   Ht 5\' 1"  (1.549 m)   Wt 172 lb (78 kg)   SpO2 96%   BMI 32.50 kg/m   Vitals:   06/19/23 1409  BP: 118/82  Pulse: 81  Height: 5\' 1"  (1.549 m)  Weight: 172 lb (78 kg)  SpO2: 96%  BMI (Calculated): 32.52     Physical Exam Vitals and nursing note reviewed.  Constitutional:      Appearance: Normal appearance.  HENT:     Head: Normocephalic and atraumatic.     Nose: Nose normal.     Mouth/Throat:     Mouth: Mucous membranes are moist.     Pharynx: Oropharynx is clear.  Eyes:     Conjunctiva/sclera: Conjunctivae normal.     Pupils: Pupils are equal, round, and reactive to light.  Cardiovascular:     Rate and Rhythm: Normal rate and regular rhythm.     Pulses: Normal pulses.     Heart sounds: Normal heart sounds. No murmur heard. Pulmonary:     Effort: Pulmonary effort is normal.     Breath sounds: Wheezing, rhonchi and rales present.  Abdominal:     General: Bowel sounds are normal.     Palpations: Abdomen is soft.     Tenderness: There is no abdominal tenderness. There is no right CVA tenderness or left CVA tenderness.  Musculoskeletal:        General: Normal range of motion.     Cervical back: Normal range of motion.     Right lower leg: No edema.     Left lower leg: No edema.  Skin:    General: Skin is warm and dry.  Neurological:     General: No focal deficit present.     Mental Status: She is alert and oriented to person, place, and time.  Psychiatric:        Mood and Affect: Mood normal.        Behavior: Behavior normal.      No results found for any visits on 06/19/23.  No results found for this or any previous visit (from the past 2160 hour(s)).    Assessment & Plan:  Start medrol dose pk- z pak.  Breztri inhaler samples given. Problem List Items Addressed This Visit     Peripheral arterial occlusive disease (HCC)   Essential hypertension, benign   Mixed hyperlipidemia   Age-related osteoporosis without current pathological fracture   Relevant Medications   alendronate (FOSAMAX) 70 MG tablet   Other Visit Diagnoses     COPD exacerbation (HCC)    -  Primary   Relevant Medications   methylPREDNISolone (MEDROL DOSEPAK) 4 MG TBPK tablet   azithromycin  (ZITHROMAX Z-PAK) 250 MG tablet       Return in about 1 week (around 06/26/2023).   Total time spent: 30 minutes  Margaretann Loveless, MD  06/19/2023   This document may have been prepared by [facility] Voice Recognition software and as such may include unintentional dictation errors.

## 2023-06-26 ENCOUNTER — Ambulatory Visit: Payer: Medicare HMO | Admitting: Internal Medicine

## 2023-07-06 DIAGNOSIS — S42201A Unspecified fracture of upper end of right humerus, initial encounter for closed fracture: Secondary | ICD-10-CM | POA: Diagnosis not present

## 2023-07-26 ENCOUNTER — Other Ambulatory Visit: Payer: Self-pay | Admitting: Internal Medicine

## 2023-07-31 DIAGNOSIS — M25511 Pain in right shoulder: Secondary | ICD-10-CM | POA: Diagnosis not present

## 2023-07-31 DIAGNOSIS — G8929 Other chronic pain: Secondary | ICD-10-CM | POA: Diagnosis not present

## 2023-08-08 DIAGNOSIS — S63501A Unspecified sprain of right wrist, initial encounter: Secondary | ICD-10-CM | POA: Diagnosis not present

## 2023-08-08 DIAGNOSIS — M25521 Pain in right elbow: Secondary | ICD-10-CM | POA: Diagnosis not present

## 2023-08-08 DIAGNOSIS — M25531 Pain in right wrist: Secondary | ICD-10-CM | POA: Diagnosis not present

## 2023-08-08 DIAGNOSIS — S5001XA Contusion of right elbow, initial encounter: Secondary | ICD-10-CM | POA: Diagnosis not present

## 2023-08-22 ENCOUNTER — Other Ambulatory Visit: Payer: Self-pay | Admitting: Internal Medicine

## 2023-08-25 ENCOUNTER — Other Ambulatory Visit: Payer: Self-pay | Admitting: Cardiovascular Disease

## 2023-09-04 DIAGNOSIS — S42201D Unspecified fracture of upper end of right humerus, subsequent encounter for fracture with routine healing: Secondary | ICD-10-CM | POA: Diagnosis not present

## 2023-09-05 ENCOUNTER — Other Ambulatory Visit: Payer: Self-pay | Admitting: Orthopedic Surgery

## 2023-09-05 DIAGNOSIS — S42201S Unspecified fracture of upper end of right humerus, sequela: Secondary | ICD-10-CM

## 2023-09-06 ENCOUNTER — Other Ambulatory Visit: Payer: Self-pay | Admitting: Orthopedic Surgery

## 2023-09-12 ENCOUNTER — Ambulatory Visit
Admission: RE | Admit: 2023-09-12 | Discharge: 2023-09-12 | Disposition: A | Discharge status: Home / Self Care | Payer: Medicare HMO | Source: Ambulatory Visit | Attending: Orthopedic Surgery | Admitting: Orthopedic Surgery

## 2023-09-12 DIAGNOSIS — S42201S Unspecified fracture of upper end of right humerus, sequela: Secondary | ICD-10-CM | POA: Insufficient documentation

## 2023-09-12 DIAGNOSIS — S42211D Unspecified displaced fracture of surgical neck of right humerus, subsequent encounter for fracture with routine healing: Secondary | ICD-10-CM | POA: Diagnosis not present

## 2023-09-20 ENCOUNTER — Other Ambulatory Visit: Payer: Self-pay

## 2023-09-20 ENCOUNTER — Other Ambulatory Visit: Payer: Self-pay | Admitting: Orthopedic Surgery

## 2023-09-20 ENCOUNTER — Encounter
Admission: RE | Admit: 2023-09-20 | Discharge: 2023-09-20 | Disposition: A | Discharge status: Home / Self Care | Payer: Medicare HMO | Source: Ambulatory Visit | Attending: Orthopedic Surgery | Admitting: Orthopedic Surgery

## 2023-09-20 DIAGNOSIS — Z01810 Encounter for preprocedural cardiovascular examination: Secondary | ICD-10-CM | POA: Diagnosis not present

## 2023-09-20 DIAGNOSIS — Z01818 Encounter for other preprocedural examination: Secondary | ICD-10-CM | POA: Diagnosis not present

## 2023-09-20 DIAGNOSIS — Z01812 Encounter for preprocedural laboratory examination: Secondary | ICD-10-CM

## 2023-09-20 DIAGNOSIS — R8290 Unspecified abnormal findings in urine: Secondary | ICD-10-CM | POA: Insufficient documentation

## 2023-09-20 HISTORY — DX: Age-related osteoporosis without current pathological fracture: M81.0

## 2023-09-20 HISTORY — DX: Essential (primary) hypertension: I10

## 2023-09-20 HISTORY — DX: Mixed hyperlipidemia: E78.2

## 2023-09-20 HISTORY — DX: Personal history of urinary calculi: Z87.442

## 2023-09-20 HISTORY — DX: Chronic obstructive pulmonary disease with (acute) exacerbation: J44.1

## 2023-09-20 HISTORY — DX: Disorder of arteries and arterioles, unspecified: I77.9

## 2023-09-20 HISTORY — DX: Other specified postprocedural states: Z98.890

## 2023-09-20 HISTORY — DX: Nausea with vomiting, unspecified: R11.2

## 2023-09-20 LAB — URINALYSIS, ROUTINE W REFLEX MICROSCOPIC
Bilirubin Urine: NEGATIVE
Glucose, UA: NEGATIVE mg/dL
Ketones, ur: NEGATIVE mg/dL
Nitrite: POSITIVE — AB
Protein, ur: NEGATIVE mg/dL
Specific Gravity, Urine: 1.012 (ref 1.005–1.030)
WBC, UA: >50 WBC/hpf (ref 0–5)
pH: 6 (ref 5.0–8.0)

## 2023-09-20 LAB — COMPREHENSIVE METABOLIC PANEL
ALT: 16 U/L (ref 0–44)
AST: 14 U/L — ABNORMAL LOW (ref 15–41)
Albumin: 4 g/dL (ref 3.5–5.0)
Alkaline Phosphatase: 78 U/L (ref 38–126)
Anion gap: 8 (ref 5–15)
BUN: 25 mg/dL — ABNORMAL HIGH (ref 8–23)
CO2: 27 mmol/L (ref 22–32)
Calcium: 8.9 mg/dL (ref 8.9–10.3)
Chloride: 104 mmol/L (ref 98–111)
Creatinine, Ser: 1.33 mg/dL — ABNORMAL HIGH (ref 0.44–1.00)
GFR, Estimated: 44 mL/min — ABNORMAL LOW (ref >60)
Glucose, Bld: 146 mg/dL — ABNORMAL HIGH (ref 70–99)
Potassium: 3.6 mmol/L (ref 3.5–5.1)
Sodium: 139 mmol/L (ref 135–145)
Total Bilirubin: 0.5 mg/dL (ref 0.3–1.2)
Total Protein: 7.5 g/dL (ref 6.5–8.1)

## 2023-09-20 LAB — CBC WITH DIFFERENTIAL/PLATELET
Abs Immature Granulocytes: 0.02 10*3/uL (ref 0.00–0.07)
Basophils Absolute: 0 10*3/uL (ref 0.0–0.1)
Basophils Relative: 0 %
Eosinophils Absolute: 0.3 10*3/uL (ref 0.0–0.5)
Eosinophils Relative: 3 %
HCT: 45.4 % (ref 36.0–46.0)
Hemoglobin: 13.6 g/dL (ref 12.0–15.0)
Immature Granulocytes: 0 %
Lymphocytes Relative: 29 %
Lymphs Abs: 2.4 10*3/uL (ref 0.7–4.0)
MCH: 27 pg (ref 26.0–34.0)
MCHC: 30 g/dL (ref 30.0–36.0)
MCV: 90.1 fL (ref 80.0–100.0)
Monocytes Absolute: 0.5 10*3/uL (ref 0.1–1.0)
Monocytes Relative: 6 %
Neutro Abs: 5.2 10*3/uL (ref 1.7–7.7)
Neutrophils Relative %: 62 %
Platelets: 240 10*3/uL (ref 150–400)
RBC: 5.04 MIL/uL (ref 3.87–5.11)
RDW: 14.1 % (ref 11.5–15.5)
WBC: 8.4 10*3/uL (ref 4.0–10.5)
nRBC: 0 % (ref 0.0–0.2)

## 2023-09-20 LAB — TYPE AND SCREEN
ABO/RH(D): B POS
Antibody Screen: NEGATIVE

## 2023-09-20 LAB — SURGICAL PCR SCREEN
MRSA, PCR: NEGATIVE
Staphylococcus aureus: POSITIVE — AB

## 2023-09-20 NOTE — Patient Instructions (Addendum)
Your procedure is scheduled on: Tuesday, October 15 Report to the Registration Desk on the 1st floor of the [facility] To find out your arrival time, please call [phone] between 1PM - 3PM on: Monday, October 14 If your arrival time is 6:00 am, do not arrive before that time as the [facility] entrance doors do not open until 6:00 am.  REMEMBER: Instructions that are not followed completely may result in serious medical risk, up to and including death; or upon the discretion of your surgeon and anesthesiologist your surgery may need to be rescheduled.  Do not eat food after midnight the night before surgery.  No gum chewing or hard candies.  You may however, drink CLEAR liquids up to 2 hours before you are scheduled to arrive for your surgery. Do not drink anything within 2 hours of your scheduled arrival time.  Clear liquids include: - water  - apple juice without pulp - gatorade (not RED colors) - black coffee or tea (Do NOT add milk or creamers to the coffee or tea) Do NOT drink anything that is not on this list.  In addition, your doctor has ordered for you to drink the provided:  Ensure Pre-Surgery Clear Carbohydrate Drink  Drinking this carbohydrate drink up to two hours before surgery helps to reduce insulin resistance and improve patient outcomes. Please complete drinking 2 hours before scheduled arrival time.  One week prior to surgery: starting October 8 Stop Anti-inflammatories (NSAIDS) such as Advil, Aleve, Ibuprofen, Motrin, Naproxen, Naprosyn and Aspirin based products such as Excedrin, Goody's Powder, BC Powder. Stop ANY OVER THE COUNTER supplements until after surgery. You may however, continue to take Tylenol if needed for pain up until the day of surgery.  Continue taking all prescribed medications with the exception of the following:  Follow instructions from Dr. Wyn Quaker regarding stopping blood thinners. (Plavix and Aspirin). Dr. Eliane Decree office will be calling  you about this. The usual time to stop is 5 days before. If you haven't heard from them by Wednesday, October 9; Please call Dr. Eliane Decree office to get an answer.   TAKE ONLY THESE MEDICATIONS THE MORNING OF SURGERY WITH A SIP OF WATER:  Atenolol Oxycodone if needed for pain Pantoprazole (Protonix) - (take one the night before and one on the morning of surgery - helps to prevent nausea after surgery.) Rosuvastatin (Crestor) Sertraline (Zolfoft) Symbicort inhaler  No Alcohol for 24 hours before or after surgery.  No Smoking including e-cigarettes for 24 hours before surgery.  No chewable tobacco products for at least 6 hours before surgery.  No nicotine patches on the day of surgery.  Do not use any "recreational" drugs for at least a week (preferably 2 weeks) before your surgery.  Please be advised that the combination of cocaine and anesthesia may have negative outcomes, up to and including death. If you test positive for cocaine, your surgery will be cancelled.  On the morning of surgery brush your teeth with toothpaste and water, you may rinse your mouth with mouthwash if you wish. Do not swallow any toothpaste or mouthwash.  Use CHG Soap as directed on instruction sheet.  Do not wear jewelry, make-up, hairpins, clips or nail polish.  For welded (permanent) jewelry: bracelets, anklets, waist bands, etc.  Please have this removed prior to surgery.  If it is not removed, there is a chance that hospital personnel will need to cut it off on the day of surgery.  Do not wear lotions, powders, or perfumes.  No deodorant.  Do not shave body hair from the neck down 48 hours before surgery.  Contact lenses, hearing aids and dentures may not be worn into surgery.  Do not bring valuables to the hospital. [facility] is not responsible for any missing/lost belongings or valuables.   Total Shoulder Arthroplasty:  use Benzoyl Peroxide 5% Gel as directed on instruction sheet.  Notify your  doctor if there is any change in your medical condition (cold, fever, infection).  Wear comfortable clothing (specific to your surgery type) to the hospital.  After surgery, you can help prevent lung complications by doing breathing exercises.  Take deep breaths and cough every 1-2 hours. Your doctor may order a device called an Incentive Spirometer to help you take deep breaths.  If you are being admitted to the hospital overnight, leave your suitcase in the car. After surgery it may be brought to your room.  In case of increased patient census, it may be necessary for you, the patient, to continue your postoperative care in the Same Day Surgery department.  If you are being discharged the day of surgery, you will not be allowed to drive home. You will need a responsible individual to drive you home and stay with you for 24 hours after surgery.   If you are taking public transportation, you will need to have a responsible individual with you.  Please call the Pre-admissions Testing Dept. at [phone] if you have any questions about these instructions.  Surgery Visitation Policy:  Patients having surgery or a procedure may have two visitors.  Children under the age of 73 must have an adult with them who is not the patient.  Inpatient Visitation:    Visiting hours are 7 a.m. to 8 p.m. Up to four visitors are allowed at one time in a patient room. The visitors may rotate out with other people during the day.  One visitor age 13 or older may stay with the patient overnight and must be in the room by 8 p.m.    Pre-operative 5 CHG Bath Instructions   You can play a key role in reducing the risk of infection after surgery. Your skin needs to be as free of germs as possible. You can reduce the number of germs on your skin by washing with CHG (chlorhexidine gluconate) soap before surgery. CHG is an antiseptic soap that kills germs and continues to kill germs even after washing.   DO  NOT use if you have an allergy to chlorhexidine/CHG or antibacterial soaps. If your skin becomes reddened or irritated, stop using the CHG and notify one of our RNs at [phone].   Please shower with the CHG soap starting 4 days before surgery using the following schedule:     Please keep in mind the following:  DO NOT shave, including legs and underarms, starting the day of your first shower.   You may shave your face at any point before/day of surgery.  Place clean sheets on your bed the day you start using CHG soap. Use a clean washcloth (not used since being washed) for each shower. DO NOT sleep with pets once you start using the CHG.   CHG Shower Instructions:  If you choose to wash your hair and private area, wash first with your normal shampoo/soap.  After you use shampoo/soap, rinse your hair and body thoroughly to remove shampoo/soap residue.  Turn the water OFF and apply about 3 tablespoons (45 ml) of CHG soap to a CLEAN  washcloth.  Apply CHG soap ONLY FROM YOUR NECK DOWN TO YOUR TOES (washing for 3-5 minutes)  DO NOT use CHG soap on face, private areas, open wounds, or sores.  Pay special attention to the area where your surgery is being performed.  If you are having back surgery, having someone wash your back for you may be helpful. Wait 2 minutes after CHG soap is applied, then you may rinse off the CHG soap.  Pat dry with a clean towel  Put on clean clothes/pajamas   If you choose to wear lotion, please use ONLY the CHG-compatible lotions on the back of this paper.     Additional instructions for the day of surgery: DO NOT APPLY any lotions, deodorants, cologne, or perfumes.   Put on clean/comfortable clothes.  Brush your teeth.  Ask your nurse before applying any prescription medications to the skin.      CHG Compatible Lotions   Aveeno Moisturizing lotion  Cetaphil Moisturizing Cream  Cetaphil Moisturizing Lotion  Clairol Herbal Essence Moisturizing Lotion,  Dry Skin  Clairol Herbal Essence Moisturizing Lotion, Extra Dry Skin  Clairol Herbal Essence Moisturizing Lotion, Normal Skin  Curel Age Defying Therapeutic Moisturizing Lotion with Alpha Hydroxy  Curel Extreme Care Body Lotion  Curel Soothing Hands Moisturizing Hand Lotion  Curel Therapeutic Moisturizing Cream, Fragrance-Free  Curel Therapeutic Moisturizing Lotion, Fragrance-Free  Curel Therapeutic Moisturizing Lotion, Original Formula  Eucerin Daily Replenishing Lotion  Eucerin Dry Skin Therapy Plus Alpha Hydroxy Crme  Eucerin Dry Skin Therapy Plus Alpha Hydroxy Lotion  Eucerin Original Crme  Eucerin Original Lotion  Eucerin Plus Crme Eucerin Plus Lotion  Eucerin TriLipid Replenishing Lotion  Keri Anti-Bacterial Hand Lotion  Keri Deep Conditioning Original Lotion Dry Skin Formula Softly Scented  Keri Deep Conditioning Original Lotion, Fragrance Free Sensitive Skin Formula  Keri Lotion Fast Absorbing Fragrance Free Sensitive Skin Formula  Keri Lotion Fast Absorbing Softly Scented Dry Skin Formula  Keri Original Lotion  Keri Skin Renewal Lotion Keri Silky Smooth Lotion  Keri Silky Smooth Sensitive Skin Lotion  Nivea Body Creamy Conditioning Oil  Nivea Body Extra Enriched Lotion  Nivea Body Original Lotion  Nivea Body Sheer Moisturizing Lotion Nivea Crme  Nivea Skin Firming Lotion  NutraDerm 30 Skin Lotion  NutraDerm Skin Lotion  NutraDerm Therapeutic Skin Cream  NutraDerm Therapeutic Skin Lotion  ProShield Protective Hand Cream  Provon moisturizing lotion   Preparing for Total Shoulder Arthroplasty  Before surgery, you can play an important role by reducing the number of germs on your skin by using the following products:                 Benzoyl Peroxide Gel  o Reduces the number of germs present on the skin  o Applied twice a day to shoulder area starting two days before surgery  Chlorhexidine Gluconate (CHG) Soap  o An antiseptic cleaner that kills germs and  bonds with the skin to continue killing germs even after washing  o Used for showering the night before surgery and morning of surgery  BENZOYL PEROXIDE 5% GEL  Please do not use if you have an allergy to benzoyl peroxide. If your skin becomes reddened/irritated stop using the benzoyl peroxide.  Starting two days before surgery, apply as follows:  1. Apply benzoyl peroxide in the morning and at night. Apply after taking a shower. If you are not taking a shower, clean entire shoulder front, back, and side along with the armpit with a clean wet washcloth.  2. Place  a quarter-sized dollop on your shoulder and rub in thoroughly, making sure to cover the front, back, and side of your shoulder, along with the armpit.  2 days before ____ AM ____ PM 1 day before ____ AM ____ PM  3. Do this twice a day for two days. (Last application is the night before surgery, AFTER using the CHG soap).  4. Do NOT apply benzoyl peroxide gel on the day of surgery.   Preoperative Educational Videos for Total Hip, Knee and Shoulder Replacements  To better prepare for surgery, please view our videos that explain the physical activity and discharge planning required to have the best surgical recovery at [facility].  [email or web address]  Questions? Call [phone] or email [email or web address]

## 2023-09-21 DIAGNOSIS — Z01818 Encounter for other preprocedural examination: Secondary | ICD-10-CM | POA: Diagnosis not present

## 2023-09-21 DIAGNOSIS — R8290 Unspecified abnormal findings in urine: Secondary | ICD-10-CM | POA: Diagnosis not present

## 2023-09-28 ENCOUNTER — Telehealth: Payer: Self-pay | Admitting: Urgent Care

## 2023-09-28 DIAGNOSIS — A4901 Methicillin susceptible Staphylococcus aureus infection, unspecified site: Secondary | ICD-10-CM

## 2023-09-28 DIAGNOSIS — Z01812 Encounter for preprocedural laboratory examination: Secondary | ICD-10-CM

## 2023-09-28 DIAGNOSIS — N390 Urinary tract infection, site not specified: Secondary | ICD-10-CM

## 2023-09-28 MED ORDER — DOXYCYCLINE HYCLATE 100 MG PO CAPS
100.0000 mg | ORAL_CAPSULE | Freq: Two times a day (BID) | ORAL | 0 refills | Status: DC
Start: 2023-09-28 — End: 2023-10-04

## 2023-09-28 NOTE — Progress Notes (Signed)
[facility] Perioperative Services: Pre-Admission/Anesthesia Testing  Abnormal Lab Notification and Treatment Plan of Care   Date: 09/28/23  Name: Rachel Hahn MRN:   [id number]  Re: Abnormal labs noted during PAT appointment   Notified:  Provider Name Provider Role Notification Mode  Signa Kell, MD Orthopedics (Surgeon) Routed and/or faxed via [facility]   Abnormal Lab Value(s):   Lab Results  Component Value Date   COLORURINE YELLOW (A) 09/20/2023   APPEARANCEUR CLOUDY (A) 09/20/2023   LABSPEC 1.012 09/20/2023   PHURINE 6.0 09/20/2023   GLUCOSEU NEGATIVE 09/20/2023   HGBUR SMALL (A) 09/20/2023   BILIRUBINUR NEGATIVE 09/20/2023   KETONESUR NEGATIVE 09/20/2023   PROTEINUR NEGATIVE 09/20/2023   NITRITE POSITIVE (A) 09/20/2023   LEUKOCYTESUR LARGE (A) 09/20/2023   EPIU 6-10 09/20/2023   WBCU >50 09/20/2023   RBCU 6-10 09/20/2023   BACTERIA FEW (A) 09/20/2023   CULT (A) 09/20/2023    >=100,000 COLONIES/mL STAPHYLOCOCCUS AUREUS Sent to [facility] for further susceptibility testing. Performed at [facility], [address], [place], [place] [address]     Clinical Information and Notes:  Patient is scheduled for RIGHT TOTAL SHOULDER ARTHROPLASTY, BICEPS TENODESIS on 10//15/2024.  UA performed in PAT consistent with/concerning for infection.  No leukocytosis noted on CBC; WBC 8.4 Renal function: Estimated Creatinine Clearance: 39.1 mL/min (A) (by C-G formula based on SCr of 1.33 mg/dL (H)). Urine C&S added to assess for pathogenically significant growth.  Impression and Plan:  Rachel Hahn with a UA that was (+) for infection; reflex culture sent.  lab experiencing difficulties with obtaining susceptibilities on the sample that has been sent. They forwarded the sample to [facility], who by report, are also experiencing issues. Initially, test data was to be back today, however now [facility] is saying 10/13 or 10/14. This will not allow for  sufficient time to treat the patient prior to surgery on the 15th. In efforts to avoid delaying patient's procedure, or have her experience any potentially significant perioperative complications related to the aforementioned, I would like to proceed with empiric treatment for urinary tract infection.  Allergies reviewed. Ideally would like to use SMZ-TMP, however patient reports intolerance citing that it makes her very nauseated. Nitrofurantoin causes urticaria rashes in this patient. Considered amoxicillin-clavulanate, however the resistance data tends to be higher with this medication. Doxycycline has excellent in-vitro ability to inhibit staphylococci, including methicillin resistant staphylococcus aureus (MRSA). With that said,  will treat with a 5 day course of DOXYCYCLINE. Patient encouraged to complete the entire course of antibiotics even if she begins to feel better. She was advised that if culture demonstrates resistance to the prescribed antibiotic, she will be contacted and advised of the need to change the antibiotic being used to treat her infection.   Meds ordered this encounter  Medications   doxycycline (VIBRAMYCIN) 100 MG capsule    Sig: Take 1 capsule (100 mg total) by mouth 2 (two) times daily for 5 days. Increase water intake while taking this medication.    Dispense:  10 capsule    Refill:  0    Please contact patient as soon as ready to be picked up. Rx is for preoperative infection treatment and needs to be started ASAP.   Patient encouraged to increase her fluid intake as much as possible. Discussed that water is always best to flush the urinary tract. She was advised to avoid caffeine containing fluids until her infections clears, as caffeine can cause her to experience painful bladder  spasms.   May use Tylenol as needed for pain/fever should she experience these symptoms.    Patient instructed to call surgeon's office or PAT with any questions or concerns related to the  above outlined course of treatment. Additionally, she was instructed to call if she feels like she is getting worse overall while on treatment. Results and treatment plan of care forwarded to primary attending surgeon to make them aware.   Encounter Diagnoses  Name Primary?   Pre-operative laboratory examination Yes   UTI (urinary tract infection)    Staph aureus infection    Rachel Mulling, MSN, APRN, FNP-C, CEN [facility]  Perioperative Services Nurse Practitioner Phone: [phone] Fax: [phone] 09/28/23 2:29 PM  NOTE: This note has been prepared using Dragon dictation software. Despite my best ability to proofread, there is always the potential that unintentional transcriptional errors may still occur from this proc or . This will /

## 2023-09-28 NOTE — Progress Notes (Signed)
Any idea why no further results? Surgery in 5 days so we should start treatment.

## 2023-09-28 NOTE — Progress Notes (Signed)
No, sounds like a good plan to me. Thank you!

## 2023-09-29 LAB — SUSCEPTIBILITY RESULT

## 2023-09-29 LAB — SUSCEPTIBILITY, AER + ANAEROB
Source of Sample: 8680
Source of Sample: 8680

## 2023-09-30 LAB — URINE CULTURE: Culture: >=100000 — AB

## 2023-10-02 ENCOUNTER — Telehealth (INDEPENDENT_AMBULATORY_CARE_PROVIDER_SITE_OTHER): Payer: Self-pay

## 2023-10-03 ENCOUNTER — Ambulatory Visit: Payer: Medicare HMO | Admitting: Urgent Care

## 2023-10-03 ENCOUNTER — Other Ambulatory Visit: Payer: Self-pay

## 2023-10-03 ENCOUNTER — Ambulatory Visit: Payer: Medicare HMO

## 2023-10-03 ENCOUNTER — Encounter: Payer: Self-pay | Admitting: Orthopedic Surgery

## 2023-10-03 ENCOUNTER — Observation Stay
Admission: RE | Admit: 2023-10-03 | Discharge: 2023-10-04 | Disposition: A | Discharge status: Home / Self Care | Payer: Medicare HMO | Attending: Orthopedic Surgery | Admitting: Orthopedic Surgery

## 2023-10-03 ENCOUNTER — Encounter
Admission: RE | Disposition: A | Discharge status: Home / Self Care | Payer: Self-pay | Source: Home / Self Care | Attending: Orthopedic Surgery

## 2023-10-03 DIAGNOSIS — I10 Essential (primary) hypertension: Secondary | ICD-10-CM | POA: Diagnosis not present

## 2023-10-03 DIAGNOSIS — R8290 Unspecified abnormal findings in urine: Secondary | ICD-10-CM

## 2023-10-03 DIAGNOSIS — S42293P Other displaced fracture of upper end of unspecified humerus, subsequent encounter for fracture with malunion: Secondary | ICD-10-CM | POA: Diagnosis present

## 2023-10-03 DIAGNOSIS — M6281 Muscle weakness (generalized): Secondary | ICD-10-CM | POA: Insufficient documentation

## 2023-10-03 DIAGNOSIS — S42291A Other displaced fracture of upper end of right humerus, initial encounter for closed fracture: Secondary | ICD-10-CM | POA: Diagnosis not present

## 2023-10-03 DIAGNOSIS — S42231P 3-part fracture of surgical neck of right humerus, subsequent encounter for fracture with malunion: Principal | ICD-10-CM | POA: Insufficient documentation

## 2023-10-03 DIAGNOSIS — S42291P Other displaced fracture of upper end of right humerus, subsequent encounter for fracture with malunion: Secondary | ICD-10-CM | POA: Diagnosis not present

## 2023-10-03 DIAGNOSIS — R8271 Bacteriuria: Secondary | ICD-10-CM

## 2023-10-03 DIAGNOSIS — Z79899 Other long term (current) drug therapy: Secondary | ICD-10-CM | POA: Diagnosis not present

## 2023-10-03 DIAGNOSIS — Z01812 Encounter for preprocedural laboratory examination: Principal | ICD-10-CM

## 2023-10-03 DIAGNOSIS — F17210 Nicotine dependence, cigarettes, uncomplicated: Secondary | ICD-10-CM | POA: Diagnosis not present

## 2023-10-03 DIAGNOSIS — R262 Difficulty in walking, not elsewhere classified: Secondary | ICD-10-CM | POA: Insufficient documentation

## 2023-10-03 DIAGNOSIS — X58XXXA Exposure to other specified factors, initial encounter: Secondary | ICD-10-CM | POA: Insufficient documentation

## 2023-10-03 DIAGNOSIS — Z7982 Long term (current) use of aspirin: Secondary | ICD-10-CM | POA: Diagnosis not present

## 2023-10-03 DIAGNOSIS — Z471 Aftercare following joint replacement surgery: Secondary | ICD-10-CM | POA: Diagnosis not present

## 2023-10-03 DIAGNOSIS — J449 Chronic obstructive pulmonary disease, unspecified: Secondary | ICD-10-CM | POA: Diagnosis not present

## 2023-10-03 DIAGNOSIS — Z96611 Presence of right artificial shoulder joint: Secondary | ICD-10-CM | POA: Diagnosis not present

## 2023-10-03 DIAGNOSIS — S42201A Unspecified fracture of upper end of right humerus, initial encounter for closed fracture: Secondary | ICD-10-CM | POA: Diagnosis not present

## 2023-10-03 DIAGNOSIS — G8918 Other acute postprocedural pain: Secondary | ICD-10-CM | POA: Diagnosis not present

## 2023-10-03 DIAGNOSIS — Z87891 Personal history of nicotine dependence: Secondary | ICD-10-CM | POA: Diagnosis not present

## 2023-10-03 HISTORY — PX: REVERSE SHOULDER ARTHROPLASTY: SHX5054

## 2023-10-03 SURGERY — Surgical Case
Anesthesia: *Unknown

## 2023-10-03 SURGERY — ARTHROPLASTY, SHOULDER, TOTAL, REVERSE
Anesthesia: General | Site: Shoulder | Laterality: Right

## 2023-10-03 MED ORDER — ONDANSETRON HCL 4 MG/2ML IJ SOLN
INTRAMUSCULAR | Status: DC | PRN
  Administered 2023-10-03: 8 mg via INTRAVENOUS

## 2023-10-03 MED ORDER — CHLORHEXIDINE GLUCONATE 0.12 % MT SOLN
OROMUCOSAL | Status: AC
  Filled 2023-10-03: qty 15

## 2023-10-03 MED ORDER — TRANEXAMIC ACID-NACL 1000-0.7 MG/100ML-% IV SOLN
INTRAVENOUS | Status: AC
  Filled 2023-10-03: qty 100

## 2023-10-03 MED ORDER — FENTANYL CITRATE PF 50 MCG/ML IJ SOSY
PREFILLED_SYRINGE | INTRAMUSCULAR | Status: AC
  Filled 2023-10-03: qty 1

## 2023-10-03 MED ORDER — CEFAZOLIN SODIUM-DEXTROSE 2-4 GM/100ML-% IV SOLN
2.0000 g | INTRAVENOUS | Status: AC
  Administered 2023-10-03 (×2): 2 g via INTRAVENOUS

## 2023-10-03 MED ORDER — OXYCODONE HCL 5 MG PO TABS: 5.0000 mg | ORAL_TABLET | ORAL | Status: DC | PRN

## 2023-10-03 MED ORDER — MOMETASONE FURO-FORMOTEROL FUM 200-5 MCG/ACT IN AERO
2.0000 | INHALATION_SPRAY | Freq: Two times a day (BID) | RESPIRATORY_TRACT | Status: DC
  Administered 2023-10-03 – 2023-10-04 (×2): 2 via RESPIRATORY_TRACT
  Filled 2023-10-03: qty 8.8

## 2023-10-03 MED ORDER — DOCUSATE SODIUM 100 MG PO CAPS
ORAL_CAPSULE | ORAL | Status: AC
  Filled 2023-10-03: qty 1

## 2023-10-03 MED ORDER — METOCLOPRAMIDE HCL 10 MG PO TABS: 5.0000 mg | ORAL_TABLET | Freq: Three times a day (TID) | ORAL | Status: DC | PRN

## 2023-10-03 MED ORDER — NALOXONE HCL 2 MG/2ML IJ SOSY: 100.0000 ug | PREFILLED_SYRINGE | Freq: Once | INTRAMUSCULAR | Status: DC

## 2023-10-03 MED ORDER — MIDAZOLAM HCL 2 MG/2ML IJ SOLN
1.0000 mg | INTRAMUSCULAR | Status: DC | PRN
  Administered 2023-10-03: 1 mg via INTRAVENOUS

## 2023-10-03 MED ORDER — ATENOLOL 50 MG PO TABS
100.0000 mg | ORAL_TABLET | Freq: Every day | ORAL | Status: DC
  Administered 2023-10-04: 100 mg via ORAL

## 2023-10-03 MED ORDER — ASPIRIN 325 MG PO TBEC
325.0000 mg | DELAYED_RELEASE_TABLET | Freq: Every day | ORAL | Status: DC
  Administered 2023-10-04: 325 mg via ORAL

## 2023-10-03 MED ORDER — VANCOMYCIN HCL 1000 MG IV SOLR
INTRAVENOUS | Status: DC | PRN
  Administered 2023-10-03: 1000 mg via TOPICAL

## 2023-10-03 MED ORDER — BUPIVACAINE HCL (PF) 0.5 % IJ SOLN
INTRAMUSCULAR | Status: DC | PRN
Start: 2023-10-03 — End: 2023-10-03
  Administered 2023-10-03: 10 mL

## 2023-10-03 MED ORDER — VANCOMYCIN HCL 1000 MG IV SOLR
INTRAVENOUS | Status: AC
  Filled 2023-10-03: qty 20

## 2023-10-03 MED ORDER — PANTOPRAZOLE SODIUM 40 MG PO TBEC
40.0000 mg | DELAYED_RELEASE_TABLET | Freq: Two times a day (BID) | ORAL | Status: DC
  Administered 2023-10-03 – 2023-10-04 (×2): 40 mg via ORAL

## 2023-10-03 MED ORDER — ACETAMINOPHEN 10 MG/ML IV SOLN
INTRAVENOUS | Status: AC
  Filled 2023-10-03: qty 100

## 2023-10-03 MED ORDER — LIDOCAINE HCL (CARDIAC) PF 100 MG/5ML IV SOSY
PREFILLED_SYRINGE | INTRAVENOUS | Status: DC | PRN
  Administered 2023-10-03: 80 mg via INTRAVENOUS

## 2023-10-03 MED ORDER — PHENYLEPHRINE HCL-NACL 20-0.9 MG/250ML-% IV SOLN
INTRAVENOUS | Status: DC | PRN
Start: 2023-10-03 — End: 2023-10-03
  Administered 2023-10-03: 60 ug/min via INTRAVENOUS
  Administered 2023-10-03: 30 ug/min via INTRAVENOUS

## 2023-10-03 MED ORDER — PROPOFOL 10 MG/ML IV BOLUS
INTRAVENOUS | Status: AC
  Filled 2023-10-03: qty 20

## 2023-10-03 MED ORDER — CHLORHEXIDINE GLUCONATE 0.12 % MT SOLN
15.0000 mL | Freq: Once | OROMUCOSAL | Status: AC
  Administered 2023-10-03: 15 mL via OROMUCOSAL

## 2023-10-03 MED ORDER — FENTANYL CITRATE PF 50 MCG/ML IJ SOSY
50.0000 ug | PREFILLED_SYRINGE | Freq: Once | INTRAMUSCULAR | Status: AC
  Administered 2023-10-03: 50 ug via INTRAVENOUS

## 2023-10-03 MED ORDER — PHENOL 1.4 % MT LIQD: 1.0000 | OROMUCOSAL | Status: DC | PRN

## 2023-10-03 MED ORDER — ALBUMIN HUMAN 5 % IV SOLN
INTRAVENOUS | Status: DC | PRN
Start: 2023-10-03 — End: 2023-10-03

## 2023-10-03 MED ORDER — MIDAZOLAM HCL 2 MG/2ML IJ SOLN
INTRAMUSCULAR | Status: DC | PRN
  Administered 2023-10-03 (×2): 1 mg via INTRAVENOUS

## 2023-10-03 MED ORDER — FENTANYL CITRATE (PF) 100 MCG/2ML IJ SOLN
INTRAMUSCULAR | Status: DC | PRN
  Administered 2023-10-03 (×2): 50 ug via INTRAVENOUS

## 2023-10-03 MED ORDER — PROPOFOL 1000 MG/100ML IV EMUL
INTRAVENOUS | Status: AC
  Filled 2023-10-03: qty 100

## 2023-10-03 MED ORDER — OXYCODONE HCL 5 MG PO TABS: 5.0000 mg | ORAL_TABLET | Freq: Once | ORAL | Status: DC | PRN

## 2023-10-03 MED ORDER — FLUMAZENIL 0.5 MG/5ML IV SOLN
INTRAVENOUS | Status: AC
  Filled 2023-10-03: qty 5

## 2023-10-03 MED ORDER — METOCLOPRAMIDE HCL 5 MG/ML IJ SOLN: 5.0000 mg | Freq: Three times a day (TID) | INTRAMUSCULAR | Status: DC | PRN

## 2023-10-03 MED ORDER — NALOXONE HCL 2 MG/2ML IJ SOSY
PREFILLED_SYRINGE | INTRAMUSCULAR | Status: AC
  Filled 2023-10-03: qty 2

## 2023-10-03 MED ORDER — PROPOFOL 10 MG/ML IV BOLUS
INTRAVENOUS | Status: DC | PRN
  Administered 2023-10-03: 50 mg via INTRAVENOUS
  Administered 2023-10-03: 100 ug/kg/min via INTRAVENOUS
  Administered 2023-10-03: 150 ug/kg/min via INTRAVENOUS
  Administered 2023-10-03: 150 mg via INTRAVENOUS
  Administered 2023-10-03: 50 mg via INTRAVENOUS

## 2023-10-03 MED ORDER — ROCURONIUM BROMIDE 100 MG/10ML IV SOLN
INTRAVENOUS | Status: DC | PRN
  Administered 2023-10-03 (×2): 30 mg via INTRAVENOUS
  Administered 2023-10-03: 20 mg via INTRAVENOUS

## 2023-10-03 MED ORDER — ROSUVASTATIN CALCIUM 20 MG PO TABS
40.0000 mg | ORAL_TABLET | Freq: Every day | ORAL | Status: DC
  Administered 2023-10-04: 40 mg via ORAL

## 2023-10-03 MED ORDER — TRANEXAMIC ACID-NACL 1000-0.7 MG/100ML-% IV SOLN
1000.0000 mg | INTRAVENOUS | Status: AC
  Administered 2023-10-03: 1000 mg via INTRAVENOUS

## 2023-10-03 MED ORDER — DOCUSATE SODIUM 100 MG PO CAPS: 100.0000 mg | ORAL_CAPSULE | Freq: Two times a day (BID) | ORAL | Status: DC

## 2023-10-03 MED ORDER — MIDAZOLAM HCL 2 MG/2ML IJ SOLN
INTRAMUSCULAR | Status: AC
  Filled 2023-10-03: qty 2

## 2023-10-03 MED ORDER — BUPIVACAINE LIPOSOME 1.3 % IJ SUSP
INTRAMUSCULAR | Status: DC | PRN
Start: 2023-10-03 — End: 2023-10-03
  Administered 2023-10-03: 10 mL

## 2023-10-03 MED ORDER — TRANEXAMIC ACID-NACL 1000-0.7 MG/100ML-% IV SOLN
1000.0000 mg | Freq: Once | INTRAVENOUS | Status: AC
  Administered 2023-10-03: 1000 mg via INTRAVENOUS

## 2023-10-03 MED ORDER — CEFAZOLIN SODIUM-DEXTROSE 2-4 GM/100ML-% IV SOLN
INTRAVENOUS | Status: AC
  Filled 2023-10-03: qty 100

## 2023-10-03 MED ORDER — LIDOCAINE HCL (PF) 1 % IJ SOLN
INTRAMUSCULAR | Status: AC
  Filled 2023-10-03: qty 5

## 2023-10-03 MED ORDER — SODIUM CHLORIDE 0.9 % IR SOLN
Status: DC | PRN
  Administered 2023-10-03: 3000 mL

## 2023-10-03 MED ORDER — CLOPIDOGREL BISULFATE 75 MG PO TABS
75.0000 mg | ORAL_TABLET | Freq: Every day | ORAL | Status: DC
  Administered 2023-10-04: 75 mg via ORAL

## 2023-10-03 MED ORDER — OXYCODONE HCL 5 MG PO TABS: 10.0000 mg | ORAL_TABLET | ORAL | Status: DC | PRN

## 2023-10-03 MED ORDER — ACETAMINOPHEN 500 MG PO TABS
1000.0000 mg | ORAL_TABLET | Freq: Three times a day (TID) | ORAL | Status: DC
  Administered 2023-10-03 – 2023-10-04 (×2): 1000 mg via ORAL

## 2023-10-03 MED ORDER — BISACODYL 10 MG RE SUPP: 10.0000 mg | Freq: Every day | RECTAL | Status: DC | PRN

## 2023-10-03 MED ORDER — 0.9 % SODIUM CHLORIDE (POUR BTL) OPTIME
TOPICAL | Status: DC | PRN
  Administered 2023-10-03: 500 mL

## 2023-10-03 MED ORDER — FENTANYL CITRATE (PF) 100 MCG/2ML IJ SOLN: 25.0000 ug | INTRAMUSCULAR | Status: DC | PRN

## 2023-10-03 MED ORDER — CEFAZOLIN SODIUM-DEXTROSE 2-4 GM/100ML-% IV SOLN
2.0000 g | Freq: Four times a day (QID) | INTRAVENOUS | Status: AC
  Administered 2023-10-03 – 2023-10-04 (×3): 2 g via INTRAVENOUS

## 2023-10-03 MED ORDER — ALBUMIN HUMAN 5 % IV SOLN
INTRAVENOUS | Status: AC
  Filled 2023-10-03: qty 250

## 2023-10-03 MED ORDER — BUPIVACAINE LIPOSOME 1.3 % IJ SUSP
INTRAMUSCULAR | Status: AC
  Filled 2023-10-03: qty 10

## 2023-10-03 MED ORDER — ORAL CARE MOUTH RINSE: 15.0000 mL | Freq: Once | OROMUCOSAL | Status: AC

## 2023-10-03 MED ORDER — SERTRALINE HCL 50 MG PO TABS
100.0000 mg | ORAL_TABLET | Freq: Every day | ORAL | Status: DC
  Administered 2023-10-04: 100 mg via ORAL

## 2023-10-03 MED ORDER — ACETAMINOPHEN 10 MG/ML IV SOLN
INTRAVENOUS | Status: DC | PRN
  Administered 2023-10-03: 1000 mg via INTRAVENOUS

## 2023-10-03 MED ORDER — SUGAMMADEX SODIUM 200 MG/2ML IV SOLN
INTRAVENOUS | Status: DC | PRN
  Administered 2023-10-03: 200 mg via INTRAVENOUS

## 2023-10-03 MED ORDER — PHENYLEPHRINE 80 MCG/ML (10ML) SYRINGE FOR IV PUSH (FOR BLOOD PRESSURE SUPPORT)
PREFILLED_SYRINGE | INTRAVENOUS | Status: DC | PRN
  Administered 2023-10-03: 160 ug via INTRAVENOUS
  Administered 2023-10-03: 80 ug via INTRAVENOUS
  Administered 2023-10-03: 160 ug via INTRAVENOUS
  Administered 2023-10-03 (×2): 80 ug via INTRAVENOUS

## 2023-10-03 MED ORDER — HYDROMORPHONE HCL 1 MG/ML IJ SOLN: 0.2000 mg | INTRAMUSCULAR | Status: DC | PRN

## 2023-10-03 MED ORDER — PANTOPRAZOLE SODIUM 40 MG PO TBEC
DELAYED_RELEASE_TABLET | ORAL | Status: AC
  Filled 2023-10-03: qty 1

## 2023-10-03 MED ORDER — IPRATROPIUM-ALBUTEROL 0.5-2.5 (3) MG/3ML IN SOLN
3.0000 mL | Freq: Once | RESPIRATORY_TRACT | Status: AC
  Administered 2023-10-03: 3 mL via RESPIRATORY_TRACT

## 2023-10-03 MED ORDER — ONDANSETRON HCL 4 MG PO TABS: 4.0000 mg | ORAL_TABLET | Freq: Four times a day (QID) | ORAL | Status: DC | PRN

## 2023-10-03 MED ORDER — IPRATROPIUM-ALBUTEROL 0.5-2.5 (3) MG/3ML IN SOLN
RESPIRATORY_TRACT | Status: AC
  Filled 2023-10-03: qty 3

## 2023-10-03 MED ORDER — LACTATED RINGERS IV SOLN: INTRAVENOUS | Status: DC

## 2023-10-03 MED ORDER — MENTHOL 3 MG MT LOZG: 1.0000 | LOZENGE | OROMUCOSAL | Status: DC | PRN

## 2023-10-03 MED ORDER — OXYCODONE HCL 5 MG/5ML PO SOLN: 5.0000 mg | Freq: Once | ORAL | Status: DC | PRN

## 2023-10-03 MED ORDER — SENNOSIDES-DOCUSATE SODIUM 8.6-50 MG PO TABS: 1.0000 | ORAL_TABLET | Freq: Every evening | ORAL | Status: DC | PRN

## 2023-10-03 MED ORDER — FENTANYL CITRATE (PF) 100 MCG/2ML IJ SOLN
INTRAMUSCULAR | Status: AC
  Filled 2023-10-03: qty 2

## 2023-10-03 MED ORDER — ALUM & MAG HYDROXIDE-SIMETH 200-200-20 MG/5ML PO SUSP: 30.0000 mL | ORAL | Status: DC | PRN

## 2023-10-03 MED ORDER — ACETAMINOPHEN 500 MG PO TABS
ORAL_TABLET | ORAL | Status: AC
  Filled 2023-10-03: qty 2

## 2023-10-03 MED ORDER — BUPIVACAINE HCL (PF) 0.5 % IJ SOLN
INTRAMUSCULAR | Status: AC
  Filled 2023-10-03: qty 10

## 2023-10-03 MED ORDER — DEXAMETHASONE SODIUM PHOSPHATE 10 MG/ML IJ SOLN
INTRAMUSCULAR | Status: DC | PRN
  Administered 2023-10-03: 10 mg via INTRAVENOUS

## 2023-10-03 MED ORDER — LIDOCAINE HCL (PF) 1 % IJ SOLN
INTRAMUSCULAR | Status: DC | PRN
Start: 2023-10-03 — End: 2023-10-03
  Administered 2023-10-03: 5 mL

## 2023-10-03 MED ORDER — ONDANSETRON HCL 4 MG/2ML IJ SOLN: 4.0000 mg | Freq: Four times a day (QID) | INTRAMUSCULAR | Status: DC | PRN

## 2023-10-03 MED ORDER — FLUMAZENIL 1 MG/10ML IV SOLN
0.2000 mg | Freq: Once | INTRAVENOUS | Status: AC
  Administered 2023-10-03: 0.2 mg via INTRAVENOUS

## 2023-10-03 MED ORDER — NALOXONE HCL 2 MG/2ML IJ SOSY
100.0000 ug | PREFILLED_SYRINGE | Freq: Once | INTRAMUSCULAR | Status: AC
  Administered 2023-10-03: 100 ug via INTRAVENOUS

## 2023-10-03 SURGICAL SUPPLY — 89 items
ADH SKN CLS APL DERMABOND .7 (GAUZE/BANDAGES/DRESSINGS) ×1
ANCH SUT .5 CRC TPR CT 40X40 (SUTURE) ×1
ANCH SUT 1.4 SUT TPE BLK/WHT (SUTURE) ×1
APL PRP STRL LF DISP 70% ISPRP (MISCELLANEOUS) ×1
BASEPLATE GLENOSPHERE 25 STD (Miscellaneous) IMPLANT
BIT DRILL 3.2 PERIPHERAL SCREW (BIT) IMPLANT
BLADE SAGITTAL WIDE XTHICK NO (BLADE) ×1 IMPLANT
BSPLAT GLND STD 25 RVRS SHLDR (Miscellaneous) ×1 IMPLANT
CAP LOCKING COCR (Cap) IMPLANT
CHLORAPREP W/TINT 26 (MISCELLANEOUS) ×1 IMPLANT
CNTNR URN SCR LID CUP LEK RST (MISCELLANEOUS) IMPLANT
COMP GLENOID PROX FIX 9 (Orthopedic Implant) ×1 IMPLANT
COMPONENT GLENOID PROX FIX 9 (Orthopedic Implant) IMPLANT
CONT SPEC 4OZ STRL OR WHT (MISCELLANEOUS) ×1
COOLER POLAR GLACIER W/PUMP (MISCELLANEOUS) ×1 IMPLANT
DERMABOND ADVANCED .7 DNX12 (GAUZE/BANDAGES/DRESSINGS) IMPLANT
DRAPE INCISE IOBAN 66X45 STRL (DRAPES) ×2 IMPLANT
DRAPE SHEET LG 3/4 BI-LAMINATE (DRAPES) ×2 IMPLANT
DRAPE TABLE BACK 80X90 (DRAPES) ×1 IMPLANT
DRAPE U-SHAPE 47X51 STRL (DRAPES) ×1 IMPLANT
DRSG OPSITE POSTOP 3X4 (GAUZE/BANDAGES/DRESSINGS) IMPLANT
DRSG OPSITE POSTOP 4X6 (GAUZE/BANDAGES/DRESSINGS) IMPLANT
DRSG OPSITE POSTOP 4X8 (GAUZE/BANDAGES/DRESSINGS) IMPLANT
DRSG TEGADERM 2-3/8X2-3/4 SM (GAUZE/BANDAGES/DRESSINGS) IMPLANT
ELECT REM PT RETURN 9FT ADLT (ELECTROSURGICAL) ×1
ELECTRODE REM PT RTRN 9FT ADLT (ELECTROSURGICAL) ×1 IMPLANT
EVACUATOR 1/8 PVC DRAIN (DRAIN) IMPLANT
GAUZE SPONGE 2X2 STRL 8-PLY (GAUZE/BANDAGES/DRESSINGS) IMPLANT
GAUZE XEROFORM 1X8 LF (GAUZE/BANDAGES/DRESSINGS) IMPLANT
GLENOIDSPHERE LATERALIZED 33 (Joint) ×1 IMPLANT
GLOVE BIOGEL PI IND STRL 8 (GLOVE) ×2 IMPLANT
GLOVE PI ULTRA LF STRL 7.5 (GLOVE) ×2 IMPLANT
GLOVE SURG ORTHO 8.0 STRL STRW (GLOVE) ×2 IMPLANT
GLOVE SURG SYN 8.0 (GLOVE) ×1 IMPLANT
GLOVE SURG SYN 8.0 PF PI (GLOVE) ×1 IMPLANT
GOWN STRL REUS W/ TWL LRG LVL3 (GOWN DISPOSABLE) ×2 IMPLANT
GOWN STRL REUS W/ TWL XL LVL3 (GOWN DISPOSABLE) ×1 IMPLANT
GOWN STRL REUS W/TWL LRG LVL3 (GOWN DISPOSABLE) ×2
GOWN STRL REUS W/TWL XL LVL3 (GOWN DISPOSABLE) ×1
GUIDEWIRE GLENOID 2.5X220 (WIRE) IMPLANT
HOOD PEEL AWAY T7 (MISCELLANEOUS) ×2 IMPLANT
IMPL REVERSE SHOULDER 0X3.5 (Shoulder) IMPLANT
IMPLANT REVERSE SHOULDER 0X3.5 (Shoulder) ×1 IMPLANT
MANIFOLD NEPTUNE II (INSTRUMENTS) ×1 IMPLANT
MASK FACE SPIDER DISP (MASK) ×1 IMPLANT
MAT ABSORB FLUID 56X50 GRAY (MISCELLANEOUS) ×1 IMPLANT
NDL REVERSE CUT 1/2 CRC (NEEDLE) IMPLANT
NDL SPNL 20GX3.5 QUINCKE YW (NEEDLE) IMPLANT
NEEDLE REVERSE CUT 1/2 CRC (NEEDLE) ×1 IMPLANT
NEEDLE SPNL 20GX3.5 QUINCKE YW (NEEDLE) IMPLANT
NS IRRIG 1000ML POUR BTL (IV SOLUTION) ×1 IMPLANT
PACK ARTHROSCOPY SHOULDER (MISCELLANEOUS) ×1 IMPLANT
PAD WRAPON POLAR SHDR XLG (MISCELLANEOUS) ×1 IMPLANT
PULSAVAC PLUS IRRIG FAN TIP (DISPOSABLE) ×1
SCREW 5.0X18 (Screw) IMPLANT
SCREW 5.5X14 (Screw) IMPLANT
SCREW 5.5X22 (Screw) IMPLANT
SCREW 5.5X26 (Screw) IMPLANT
SCREW ASSEMBLY COCR TYPE 0 (Screw) IMPLANT
SCREW BONE 6.5 ODX25 SMALL (Screw) IMPLANT
SCREW BONE INTRNL SM 7 (Screw) IMPLANT
SCREW PERIPHERAL 5.0X34 (Screw) IMPLANT
SHOULDER SYSTEM 33 OD 12.5DEG (Joint) ×1 IMPLANT
SLING ULTRA II LG (MISCELLANEOUS) IMPLANT
SLING ULTRA II M (MISCELLANEOUS) IMPLANT
SPHERE GLENOID LATERALIZED 33 (Joint) IMPLANT
SPONGE T-LAP 18X18 ~~LOC~~+RFID (SPONGE) ×1 IMPLANT
STAPLER SKIN PROX 35W (STAPLE) IMPLANT
STEM DIST PTC 9X90 (Stem) IMPLANT
STRAP SAFETY 5IN WIDE (MISCELLANEOUS) ×1 IMPLANT
SUT ETHIBOND 5-0 MS/4 CCS GRN (SUTURE) ×1
SUT FIBERWIRE #2 38 BLUE 1/2 (SUTURE) ×2
SUT MNCRL AB 4-0 PS2 18 (SUTURE) IMPLANT
SUT PROLENE 6 0 P 1 18 (SUTURE) IMPLANT
SUT TICRON 2-0 30IN 311381 (SUTURE) ×2 IMPLANT
SUT VIC AB 0 CT1 36 (SUTURE) ×1 IMPLANT
SUT VIC AB 2-0 CT2 27 (SUTURE) ×2 IMPLANT
SUT XBRAID 1.4 BLK/WHT (SUTURE) IMPLANT
SUT XBRAID 1.4 BLUE (SUTURE) IMPLANT
SUT XBRAID 1.4 WHITE/BLUE (SUTURE) IMPLANT
SUT XBRAID 2 BLACK/BLUE (SUTURE) IMPLANT
SUTURE ETHBND 5-0 MS/4 CCS GRN (SUTURE) ×1 IMPLANT
SUTURE FIBERWR #2 38 BLUE 1/2 (SUTURE) ×1 IMPLANT
SYR 30ML LL (SYRINGE) IMPLANT
SYSTEM SHOULDER 33 OD 12.5DEG (Joint) IMPLANT
TIP FAN IRRIG PULSAVAC PLUS (DISPOSABLE) ×1 IMPLANT
TRAP FLUID SMOKE EVACUATOR (MISCELLANEOUS) ×1 IMPLANT
WATER STERILE IRR 500ML POUR (IV SOLUTION) ×1 IMPLANT
WRAPON POLAR PAD SHDR XLG (MISCELLANEOUS) ×1

## 2023-10-03 NOTE — Anesthesia Procedure Notes (Signed)
Anesthesia Regional Block: Interscalene brachial plexus block   Pre-Anesthetic Checklist: , timeout performed,  Correct Patient, Correct Site, Correct Laterality,  Correct Procedure, Correct Position, site marked,  Risks and benefits discussed,  Surgical consent,  Pre-op evaluation,  At surgeon's request and post-op pain management  Laterality: Right and Upper  Prep: alcohol swabs       Needles:  Injection technique: Single-shot  Needle Type: Echogenic Needle     Needle Length: 4cm  Needle Gauge: 25     Additional Needles:   Procedures:,,,, ultrasound used (permanent image in chart),,    Narrative:  Start time: 10/03/2023 11:33 AM End time: 10/03/2023 11:37 AM Injection made incrementally with aspirations every 5 mL.  Performed by: Personally  Anesthesiologist: Louie Boston, MD  Additional Notes: Patient's chart reviewed and they were deemed appropriate candidate for procedure, at surgeon's request. Patient educated about risks, benefits, and alternatives of the block including but not limited to: temporary or permanent nerve damage, bleeding, infection, damage to surround tissues, pneumothorax, hemidiaphragmatic paralysis, unilateral Horner's syndrome, block failure, local anesthetic toxicity. Patient expressed understanding. A formal time-out was conducted consistent with institution rules.  Monitors were applied, and minimal sedation used (see nursing record). The site was prepped with skin prep and allowed to dry, and sterile gloves were used. A high frequency linear ultrasound probe with probe cover was utilized throughout. C5-7 nerve roots located and appeared anatomically normal, local anesthetic injected around them, and echogenic block needle trajectory was monitored throughout. Aspiration performed every 5ml. Lung and blood vessels were avoided. All injections were performed without resistance and free of blood and paresthesias. The patient tolerated the procedure  well.  Injectate: 10ml exparel + 10ml 0.5% bupivacaine

## 2023-10-03 NOTE — Transfer of Care (Signed)
Immediate Anesthesia Transfer of Care Note  Patient: Rachel Hahn  Procedure(s) Performed: Right Reverse Total shoulder arthroplasty, biceps tenodesis (Right: Shoulder)  Patient Location: PACU  Anesthesia Type:General  Level of Consciousness: awake, drowsy, and patient cooperative  Airway & Oxygen Therapy: Patient Spontanous Breathing and Patient connected to face mask oxygen  Post-op Assessment: Report given to RN and Post -op Vital signs reviewed and stable  Post vital signs: Reviewed and stable  Last Vitals:  Vitals Value Taken Time  BP 70/53 10/03/23 1645  Temp    Pulse 78 10/03/23 1646  Resp 11 10/03/23 1646  SpO2 97 % 10/03/23 1646  Vitals shown include unfiled device data.  Last Pain:  Vitals:   10/03/23 1002  TempSrc: Temporal  PainSc: 0-No pain         Complications: No notable events documented.

## 2023-10-03 NOTE — Anesthesia Preprocedure Evaluation (Addendum)
Anesthesia Evaluation  Patient identified by MRN, date of birth, ID band Patient awake    Reviewed: Allergy & Precautions, NPO status , Patient's Chart, lab work & pertinent test results  History of Anesthesia Complications (+) PONV and history of anesthetic complications  Airway Mallampati: III  TM Distance: >3 FB Neck ROM: full    Dental no notable dental hx. (+) Upper Dentures, Lower Dentures   Pulmonary COPD,  COPD inhaler, former smoker   Pulmonary exam normal        Cardiovascular hypertension, On Medications and On Home Beta Blockers + Peripheral Vascular Disease  Normal cardiovascular exam+ Valvular Problems/Murmurs MVP      Neuro/Psych  PSYCHIATRIC DISORDERS Anxiety Depression     Neuromuscular disease    GI/Hepatic Neg liver ROS,GERD  Controlled,,  Endo/Other  negative endocrine ROS    Renal/GU      Musculoskeletal   Abdominal   Peds  Hematology negative hematology ROS (+)   Anesthesia Other Findings Past Medical History: No date: Age related osteoporosis No date: Anxiety 05/2022: 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) 03/22/2023: Closed fracture of proximal end of right humerus No date: COPD exacerbation (HCC) No date: Depression 2015: GERD (gastroesophageal reflux disease) No date: History of kidney stones No date: Hypertension, essential No date: Kidney stones No date: Mitral valve prolapse No date: Mixed hyperlipidemia 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 No date: Peripheral arterial occlusive disease (HCC) No date: PONV (postoperative nausea and vomiting) 11/2021: 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  Past Surgical History: 06/08/2022: AORTIC INTERVENTION; N/A     Comment:  Procedure: AORTIC INTERVENTION;  Surgeon: Annice Needy,               MD;  Location: [facility] INVASIVE CV LAB;  Service:               Cardiovascular;  Laterality: N/A; No date:  CYSTOSCOPY WITH HOLMIUM LASER LITHOTRIPSY; Bilateral 12/29/2021: ELBOW ARTHROPLASTY; Right     Comment:  radial tunnel release No date: KIDNEY CYST REMOVAL No date: LITHOTRIPSY No date: STRABISMUS SURGERY; Bilateral No date: TONSILLECTOMY  BMI    Body Mass Index: 32.06 kg/m      Reproductive/Obstetrics negative OB ROS                             Anesthesia Physical Anesthesia Plan  ASA: 2  Anesthesia Plan: General ETT   Post-op Pain Management: Regional block* and Ofirmev IV (intra-op)*   Induction: Intravenous  PONV Risk Score and Plan: 4 or greater and Ondansetron, Treatment may vary due to age or medical condition, TIVA, Dexamethasone and Propofol infusion  Airway Management Planned: Oral ETT  Additional Equipment:   Intra-op Plan:   Post-operative Plan: Extubation in OR  Informed Consent: I have reviewed the patients History and Physical, chart, labs and discussed the procedure including the risks, benefits and alternatives for the proposed anesthesia with the patient or authorized representative who has indicated his/her understanding and acceptance.     Dental Advisory Given  Plan Discussed with: Anesthesiologist, CRNA and Surgeon  Anesthesia Plan Comments: (Patient consented for risks of anesthesia including but not limited to:  - adverse reactions to medications - damage to eyes, teeth, lips or other oral mucosa - nerve damage due to positioning  - sore throat or hoarseness - Damage to heart, brain, nerves, lungs, other parts of body or loss of life  Patient voiced understanding  and assent.)       Anesthesia Quick Evaluation

## 2023-10-03 NOTE — H&P (Signed)
Paper H&P to be scanned into permanent record. H&P reviewed. No significant changes noted.  

## 2023-10-03 NOTE — Anesthesia Procedure Notes (Signed)
Procedure Name: Intubation Date/Time: 10/03/2023 12:34 PM  Performed by: Elisabeth Pigeon, CRNAPre-anesthesia Checklist: Patient identified, Patient being monitored, Timeout performed, Emergency Drugs available and Suction available Patient Re-evaluated:Patient Re-evaluated prior to induction Oxygen Delivery Method: Circle system utilized Preoxygenation: Pre-oxygenation with 100% oxygen Induction Type: IV induction Ventilation: Mask ventilation without difficulty Laryngoscope Size: Mac, 3 and McGraph Grade View: Grade I Tube type: Oral Tube size: 6.5 mm Number of attempts: 1 Airway Equipment and Method: Stylet Placement Confirmation: ETT inserted through vocal cords under direct vision, positive ETCO2 and breath sounds checked- equal and bilateral Secured at: 21 cm Tube secured with: Tape Dental Injury: Teeth and Oropharynx as per pre-operative assessment

## 2023-10-03 NOTE — Discharge Instructions (Signed)
Rachel Albee, MD  [facility]  Phone: [phone]  Fax: [phone]   Discharge Instructions after Reverse Shoulder Replacement    1. Activity/Sling: You are to be non-weight bearing on operative extremity. A sling/shoulder immobilizer has been provided for you. Only remove the sling to perform elbow, wrist, and hand RoM exercises and hygiene/dressing. Active reaching and lifting are not permitted. You will be given further instructions on sling use at your first physical therapy visit and postoperative visit with Dr. Allena Katz.   2. Dressings: Dressing may be removed at 1st physical therapy visit (~3-4 days after surgery). Afterwards, you may either leave open to air (if no drainage) or cover with dry, sterile dressing. If you have steri-strips on your wound, please do not remove them. They will fall off on their own. You may shower 5 days after surgery. Please pat incision dry. Do not rub or place any shear forces across incision. If there is drainage or any opening of incision after 5 days, please notify our offices immediately.    3. Driving:  Plan on not driving for six weeks. Please note that you are advised NOT to drive while taking narcotic pain medications as you may be impaired and unsafe to drive.   4. Medications:  - You have been provided a prescription for narcotic pain medicine (usually oxycodone). After surgery, take 1-2 narcotic tablets every 4 hours if needed for severe pain. Please start this as soon as you begin to start having pain (if you received a nerve block, start taking as soon as this wears off).  - A prescription for anti-nausea medication will be provided in case the narcotic medicine causes nausea - take 1 tablet every 6 hours only if nauseated.  - Take enteric coated aspirin 325 mg once daily for 6 weeks to prevent blood clots. Do not take aspirin if you have an aspirin sensitivity/allergy or asthma or are on an anticoagulant (blood thinner) already. If so, then  your home anticoagulant will be resume and managed - do not take aspirin. -Take tylenol 1000mg  (2 Extra strength or 3 regular strength tablets) every 8 hours for pain. This will reduce the amount of narcotic medication needed. May stop tylenol when you are having minimal pain. - Take a stool softener (Colace, Dulcolax or Senakot) if you are using narcotic pain medications to help with constipation that is associated with narcotic use. - DO NOT take ANY nonsteroidal anti-inflammatory pain medications: Advil, Motrin, Ibuprofen, Aleve, Naproxen, or Naprosyn.   If you are taking prescription medication for anxiety, depression, insomnia, muscle spasm, chronic pain, or for attention deficit disorder you are advised that you are at a higher risk of adverse effects with use of narcotics post-op, including narcotic addiction/dependence, depressed breathing, death. If you use non-prescribed substances: alcohol, marijuana, cocaine, heroin, methamphetamines, etc., you are at a higher risk of adverse effects with use of narcotics post-op, including narcotic addiction/dependence, depressed breathing, death. You are advised that taking > 50 morphine milligram equivalents (MME) of narcotic pain medication per day results in twice the risk of overdose or death. For your prescription provided: oxycodone 5 mg - taking more than 6 tablets per day after the first few days of surgery.   5. Physical Therapy: 1-2 times per week for ~12 weeks. Therapy typically starts on post operative Day 3 or 4. You have been provided an order for physical therapy. The therapist will provide home exercises. Please contact our offices if this appointment has not been scheduled.  6. Work: May do light duty/desk job in approximately 2 weeks when off of narcotics, pain is well-controlled, and swelling has decreased if able to function with one arm in sling. Full work may take 6 weeks if light motions and function of both arms is required.  Lifting jobs may require 12 weeks.   7. Post-Op Appointments: Your first post-op appointment will be with Dr. Allena Katz in approximately 2 weeks time.    If you find that they have not been scheduled please call the Orthopaedic Appointment front desk at [phone].                               Rachel Albee, MD [facility] Phone: [phone] Fax: [phone]   REVERSE SHOULDER ARTHROPLASTY REHAB GUIDELINES   These guidelines should be tailored to individual patients based on their rehab goals, age, precautions, quality of repair, etc.  Progression should be based on patient progress and approval by the referring physician.  PHASE 1 - Day 1 through Week 2  GENERAL GUIDELINES AND PRECAUTIONS Sling wear 24/7 except during grooming and home exercises (3 to 5 times daily) Avoid shoulder extension such that the arm is posterior the frontal plane.  When patients recline, a pillow should be placed behind the upper arm and sling should be on.  They should be advised to always be able to see the elbow Avoid combined IR/ADD/EXT, such as hand behind back to prevent dislocation Avoid combined IR and ADD such as reaching across the chest to prevent dislocation No AROM No submersion in pool/water for 4 weeks No weight bearing through operative arm (as in transfers, walker use, etc.)  GOALS Maintain integrity of joint replacement; protect soft tissue healing Increase PROM for elevation to 120 and ER to 30 (will remain the goal for first 6 weeks) Optimize distal UE circulation and muscle activity (elbow, wrist and hand) Instruct in use of sling for proper fit, polar care device for ice application after HEP, signs/symptoms of infection  EXERCISES Active elbow, wrist and hand Passive forward elevation in scapular plane to 90-120 max motion; ER in scapular plane to 30 Active scapular retraction with arms resting in neutral position  CRITERIA TO PROGRESS TO  PHASE 2 Low pain (less than 3/10) with shoulder PROM Healing of incision without signs of infection Clearance by MD to advance after 2 week MD check up  PHASE 2 - 2 weeks - 6 weeks  GENERAL GUIDELINES AND PRECAUTIONS Sling may be removed while at home; worn in community without abduction pillow May use arm for light activities of daily living (such as feeding, brushing teeth, dressing.) with elbow near  the side of the body  and arm in front of the body- no active lifting of the arm May submerge in water (tub, pool, [place], etc.) after 4 weeks Continue to avoid WBing through the operative arm Continue to avoid combined IR/EXT/ADD (hand behind the back) and IR/ADD  (reaching across chest) for dislocation precautions  GOALS  Achieve passive elevation to 120 and ER to 30  Low (less than 3/10) to no pain  Ability to fire all heads of the deltoid  EXERCISES May discontinue grip, and active elbow and wrist exercises since using the arm in ADL's  with sling removed around the home Continue passive elevation to 120 and ER to 30, both in scapular plane with arm supported on table top Add submaximal isometrics, pain free effort, for all  functional heads of deltoid (anterior, posterior, middle)  Ensure that with posterior deltoid isometric the shoulder does not move into extension and the arm remains anterior the frontal plane At 4 weeks:  begin to place arm in balanced position of 90 deg elevation in supine; when patient able to hold this position with ease, may begin reverse pendulums clockwise and counterclockwise  CRITERIA TO PROGRESS TO PHASE 3 Passive forward elevation in scapular plane to 120; passive ER in scapular plane to 30 Ability to fire isometrically all heads of the deltoid muscle without pain Ability to place and hold the arm in balanced position (90 deg elevation in supine)  PHASE 3 - 6 weeks to 3 months  GENERAL GUIDELINES AND PRECAUTIONS Discontinue use of sling Avoid  forcing end range motion in any direction to prevent dislocation  May advance use of the arm actively in ADL's without being restricted to arm by the side of the body, however, avoid heavy lifting and sports (forever!) May initiate functional IR behind the back gently NO UPPER BODY ERGOMETER   GOALS Optimize PROM for elevation and ER in scapular plane with realistic expectation that max  mobility for elevation is usually around 145-160 passively; ER 40 to 50 passively; functional IR to L1 Recover AROM to approach as close to PROM available as possible; may expect 135-150 deg active elevation; 30 deg active ER; active functional IR to L1 Establish dynamic stability of the shoulder with deltoid and periscapular muscle gradual strengthening  EXERCISES Forward elevation in scapular plane active progression: supine to incline, to vertical; short to long lever arm Balanced position long lever arm AROM Active ER/IR with arm at side Scapular retraction with light band resistance Functional IR with hand slide up back - very gentle and gradual NO UPPER BODY ERGOMETER     CRITERIA TO PROGRESS TO PHASE 4  AROM equals/approaches PROM with good mechanics for elevation   No pain  Higher level demand on shoulder than ADL functions   PHASE 4 12 months and beyond  GENERAL GUIDELINES AND PRECAUTIONS No heavy lifting and no overhead sports No heavy pushing activity Gradually increase strength of deltoid and scapular stabilizers; also the rotator cuff if present with weights not to exceed 5 lbs NO UPPER BODY ERGOMETER   GOALS  Optimize functional use of the operative UE to meet the desired demands  Gradual increase in deltoid, scapular muscle, and rotator cuff strength  Pain free functional activities   EXERCISES Add light hand weights for deltoid up to and not to exceed 3 lbs for anterior and posterior with long arm lift against gravity; elbow bent to 90 deg for abduction in scapular  plane Theraband progression for extension to hip with scapular depression/retraction Theraband progression for serratus anterior punches in supine; avoid wall, incline or prone pressups for serratus anterior End range stretching gently without forceful overpressure in all planes (elevation in scapular plane, ER in scapular plane, functional IR) with stretching done for life as part of a daily routine NO UPPER BODY ERGOMETER     CRITERIA FOR DISCHARGE FROM SKILLED PHYSICAL THERAPY  Pain free AROM for shoulder elevation (expect around 135-150)  Functional strength for all ADL's, work tasks, and hobbies approved by Careers adviser  Independence with home maintenance program   NOTES: 1. With proper exercise, motion, strength, and function continue to improve even after one year. 2. The complication rate after surgery is 5 - 8%. Complications include infection, fracture, heterotopic bone formation, nerve injury, instability, rotator cuff  tear, and tuberosity nonunion. Please look for clinical signs, unusual symptoms, or lack of progress with therapy and report those to Dr. Allena Katz. Prefer more communication than less.  3. The therapy plan above only serves as a guide. Please be aware of specific individualized patient instructions as written on the prescription or through discussions with the surgeon. 4. Please call Dr. Allena Katz if you have any specific questions or concerns [phone]

## 2023-10-03 NOTE — Telephone Encounter (Signed)
[facility]y called about a medical clearance for the patient. Form wasn't faxed or put on Dr. Wyn Quaker desk to be signed. I called [facility]y to refax another form (10/14/02024 )to get signed by Dr. Wyn Quaker this morning before the patients surgery @ 11:30. Clearance was signed at faxed at 8:35 am.

## 2023-10-03 NOTE — Op Note (Signed)
Operative Note    SURGERY DATE: 10/03/2023   PRE-OP DIAGNOSIS:  1. Right proximal humerus malunion   POST-OP DIAGNOSIS:  1. Right 3-part proximal malunion   PROCEDURES:  1. Right reverse total shoulder arthroplasty 2. Right biceps tenodesis   SURGEON: Rosealee Albee, MD  ASSISTANTS: Dedra Skeens, PA; Ahmed Prima, PA-S    ANESTHESIA: Gen + interscalene block   ESTIMATED BLOOD LOSS: 300cc   TOTAL IV FLUIDS: see anesthesia record  IMPLANTS: Tornier Perform 25mm Glenoid Baseplate with 3 associated glenoid baseplate screws; 33mm standard glenosphere; Aequalis Flex Revive 9mm Press Fit stem, High (3.48mm) Eccentric Reversed Tray; Reversed Insert +25mm  INDICATION(S):  Rachel Hahn is 67 y.o. female who sustained a proximal humerus fracture over 6 months ago.  She developed a proximal humerus malunion with persistent pain at the end ranges of motion with significant limitation and stiffness in her motion as well. After discussion of risks, benefits, and alternatives to surgery, the patient elected to proceed with reverse shoulder arthroplasty and biceps tenodesis.   OPERATIVE FINDINGS: Proximal humerus malunion, biceps tendinopathy   OPERATIVE REPORT:   I identified Rachel Hahn in the pre-operative holding area. Informed consent was obtained and the surgical site was marked. I reviewed the risks and benefits of the proposed surgical intervention and the patient (and/or patient's guardian) wished to proceed. An interscalene block with Exparel was administered by the Anesthesia team. The patient was transferred to the operative suite and general anesthesia was administered. The patient was placed in the beach chair position with the head of the bed elevated approximately 30 degrees. All down side pressure points were appropriately padded. Appropriate IV antibiotics were administered. Tranexamic acid was also administered after verifying that the patient had no contraindications. The extremity  was then prepped and draped in standard fashion. A time out was performed confirming the correct extremity, correct patient, and correct procedure.   The standard deltopectoral incision from the coracoid to ~10cm distal was utilized.  Vancomycin powder was placed in the dermis.  The cephalic vein identified and it was retracted laterally.  By the end of the surgery, the cephalic vein had been nicked and was therefore cauterized.  We opened the deltopectoral interval widely and placed retractors under the CA ligament in the subacromial space and under the deltoid tendon at its insertion. We then abducted and internally rotated the arm and released the underlying bursa between these retractors, taking care not to damage the circumflex branch of the axillary nerve.   Next, we brought the arm back in adduction at slight forward flexion with external rotation. We opened the clavipectoral fascia lateral to the conjoint tendon.  The "3 sisters" were identified and coagulated.  The arm was then internally rotated, we cut the falciform ligament at approximately 1 cm of the upper portion of the pectoralis major insertion. Next we unroofed the bicipital groove. Biceps tendon was identified and 2 Ti-Cron sutures were utilized to perform a soft tissue tenodesis to the upper border of the pectoralis major tendon.  Proximal portion of the biceps tendon was excised.  Next, the subscapularis and anterior capsule were peeled off of the lesser tuberosity. We released the inferior capsule from the humerus all the way to the posterior band of the inferior glenohumeral ligament. When this was complete we gently dislocated the shoulder up into the wound.  Further release was performed given the existing proximal humerus malunion with the humeral head and posterior medial varus.  Rongeur was used to remove the  prominent bone about the lesser tuberosity.  A sawblade was then used to cut the humeral head while leaving the greater and  lesser tuberosities intact.  The superior portion of the lesser tuberosity was excised given that it was hard, sclerotic bone.  This allowed for appropriate access to the humeral canal.  Humeral canal was broached at 20 degrees retroversion and broach was left in place.  We then turned our attention to the glenoid. The proximal humerus was retracted posteriorly. The anterior capsule was dissected free from the retracted subscapularis. The anterior capsule was then excised, exposing the anterior glenoid. We then grasped the labrum and removed it circumferentially. During the glenoid exposure, the axillary nerve was protected the entire time.  Standard instrumentation was performed.  The baseplate with 6.5 mm central screw was placed, but this did not achieve appropriate fixation.  Therefore a central post was utilized.  This did allow for good fixation.  Superior compression screw and anterior and posterior locking screws were placed in the baseplate. The peripheral reamer was used to ensure appropriate glenosphere seating.  Next, the glenosphere was impacted into place.  The central screw of the glenosphere was tightened.  We then turned our attention to the humerus.  The humeral canal was sounded and sized appropriately until excellent diaphyseal fit was achieved.  Broaching was performed at ~20 degrees of retroversion.  Next, trial implants were attempted to be placed, but the joint was too tight.  First, I attempted to downsize the stem and sink it further into the humeral canal trial implants were placed.  This was still too tight.  On attempted reduction, there was a fracture of the greater tuberosity as well as of the anterior cortex of the humerus.  There was no extension of the humeral fracture line.  Given this, decision was made to place a diaphyseal fit humeral stem.  This allowed for further seating of the stem.  Trial implants were then placed.  The joint was reduced and noted to have satisfactory  stability, motion, and deltoid tension with adequate tuberosity reduction.  We drilled a hole into the shaft on lateral to the bicipital groove ~2cm inferior to the humeral fracture line and passed and Xbraid tapes through the holes to serve as vertical tuberosity fixation sutures.  Another X-braid suture was also passed through the greater tuberosity to serve as a tuberosity to stem suture.  The suture was then passed through the implant.  The stem was then inserted into the appropriate position.  Excellent press-fit was achieved.  Reverse tray and poly insert were placed.  Decision was made to not repair the subscapularis given that the lesser tuberosity was no longer present.  The joint was then reduced and appropriate tension and stability was confirmed.  Greater tuberosity was placed in an appropriate position with the arm in slight external rotation.  Vertical suture was passed through the superior aspect of the greater tuberosity.  Bone graft was placed at the site of the greater tuberosity fracture.  Xbraid tape previously passed through the implant was then tied, securing the greater tuberosity to the implant.  Vertical suture was tied next.  There is still some slight movement of the greater tuberosity.  The heavy free needle was used to pass a FiberWire stitch through the greater tuberosity and then through the humeral shaft.  This was also tied.  This construct achieved excellent stability of the greater tuberosity, and it was stable to external and internal rotation.   Final  confirmation of motion, tension, and stability were satisfactory. The wound was thoroughly irrigated. Vancomycin powder was placed.  A Hemovac drain was placed.    The deltopectoral interval was closed with a running, 0-Vicryl suture. The skin was closed with 2-0 Vicryl and 4-0 Monocryl with Dermabond. Honeycomb dressing were applied. A PolarCare unit and sling were placed. Patient was extubated, transferred to a stretcher  bed and to the post anesthesia care unit in stable condition.    Of note, assistance from a PA was essential to performing the surgery.  PA was present for the entire surgery.  PA assisted with patient positioning, retraction, instrumentation, and wound closure. The surgery would have been more difficult and had longer operative time without PA assistance.   Additionally, this case had significantly increased complexity and surgical time compared to standard reverse shoulder arthroplasty.  This was due to this being a proximal humerus malunion.  This caused significantly distorted anatomy which required more careful and meticulous dissection, adding to surgical time.  Underlying humeral anatomy was also quite distorted leading to a backup method of humeral component implantation..  These factors added approximately 60 minutes to the surgical time compared to that for a standard reverse shoulder arthroplasty.   POSTOPERATIVE PLAN: Operative arm to remain in sling and abduction pillow with arm at neutral at all times except RoM exercises and hygiene. Can perform pendulums, elbow/wrist/hand RoM exercises.  Resume home Plavix on postoperative day #1 and take ASA 325mg  x 6 weeks for DVT ppx. Patient to return to clinic in ~2 weeks for post-operative appointment.

## 2023-10-04 ENCOUNTER — Encounter: Payer: Self-pay | Admitting: Orthopedic Surgery

## 2023-10-04 DIAGNOSIS — M6281 Muscle weakness (generalized): Secondary | ICD-10-CM | POA: Diagnosis not present

## 2023-10-04 DIAGNOSIS — S42231P 3-part fracture of surgical neck of right humerus, subsequent encounter for fracture with malunion: Secondary | ICD-10-CM | POA: Diagnosis not present

## 2023-10-04 DIAGNOSIS — I10 Essential (primary) hypertension: Secondary | ICD-10-CM | POA: Diagnosis not present

## 2023-10-04 DIAGNOSIS — R262 Difficulty in walking, not elsewhere classified: Secondary | ICD-10-CM | POA: Diagnosis not present

## 2023-10-04 DIAGNOSIS — Z7982 Long term (current) use of aspirin: Secondary | ICD-10-CM | POA: Diagnosis not present

## 2023-10-04 DIAGNOSIS — Z79899 Other long term (current) drug therapy: Secondary | ICD-10-CM | POA: Diagnosis not present

## 2023-10-04 DIAGNOSIS — F17210 Nicotine dependence, cigarettes, uncomplicated: Secondary | ICD-10-CM | POA: Diagnosis not present

## 2023-10-04 LAB — CBC
HCT: 36 % (ref 36.0–46.0)
Hemoglobin: 11.6 g/dL — ABNORMAL LOW (ref 12.0–15.0)
MCH: 27.8 pg (ref 26.0–34.0)
MCHC: 32.2 g/dL (ref 30.0–36.0)
MCV: 86.1 fL (ref 80.0–100.0)
Platelets: 234 10*3/uL (ref 150–400)
RBC: 4.18 MIL/uL (ref 3.87–5.11)
RDW: 13.6 % (ref 11.5–15.5)
WBC: 14.7 10*3/uL — ABNORMAL HIGH (ref 4.0–10.5)
nRBC: 0 % (ref 0.0–0.2)

## 2023-10-04 LAB — BASIC METABOLIC PANEL
Anion gap: 10 (ref 5–15)
BUN: 24 mg/dL — ABNORMAL HIGH (ref 8–23)
CO2: 23 mmol/L (ref 22–32)
Calcium: 8.7 mg/dL — ABNORMAL LOW (ref 8.9–10.3)
Chloride: 105 mmol/L (ref 98–111)
Creatinine, Ser: 1.47 mg/dL — ABNORMAL HIGH (ref 0.44–1.00)
GFR, Estimated: 39 mL/min — ABNORMAL LOW (ref >60)
Glucose, Bld: 158 mg/dL — ABNORMAL HIGH (ref 70–99)
Potassium: 4.3 mmol/L (ref 3.5–5.1)
Sodium: 138 mmol/L (ref 135–145)

## 2023-10-04 MED ORDER — DOCUSATE SODIUM 100 MG PO CAPS
ORAL_CAPSULE | ORAL | Status: AC
  Filled 2023-10-04: qty 1

## 2023-10-04 MED ORDER — ORAL CARE MOUTH RINSE: 15.0000 mL | OROMUCOSAL | Status: DC | PRN

## 2023-10-04 MED ORDER — CLOPIDOGREL BISULFATE 75 MG PO TABS
ORAL_TABLET | ORAL | Status: AC
  Filled 2023-10-04: qty 1

## 2023-10-04 MED ORDER — PANTOPRAZOLE SODIUM 40 MG PO TBEC
DELAYED_RELEASE_TABLET | ORAL | Status: AC
  Filled 2023-10-04: qty 1

## 2023-10-04 MED ORDER — ASPIRIN 325 MG PO TBEC: 325.0000 mg | DELAYED_RELEASE_TABLET | Freq: Every day | ORAL | Status: AC

## 2023-10-04 MED ORDER — ASPIRIN 325 MG PO TBEC
DELAYED_RELEASE_TABLET | ORAL | Status: AC
  Filled 2023-10-04: qty 1

## 2023-10-04 MED ORDER — ATENOLOL 50 MG PO TABS
ORAL_TABLET | ORAL | Status: AC
  Filled 2023-10-04: qty 2

## 2023-10-04 MED ORDER — ROSUVASTATIN CALCIUM 20 MG PO TABS
ORAL_TABLET | ORAL | Status: AC
  Filled 2023-10-04: qty 2

## 2023-10-04 MED ORDER — CEFAZOLIN SODIUM-DEXTROSE 2-4 GM/100ML-% IV SOLN
INTRAVENOUS | Status: AC
  Filled 2023-10-04: qty 100

## 2023-10-04 MED ORDER — SERTRALINE HCL 50 MG PO TABS
ORAL_TABLET | ORAL | Status: AC
  Filled 2023-10-04: qty 2

## 2023-10-04 MED ORDER — OXYCODONE HCL 5 MG PO TABS: 5.0000 mg | ORAL_TABLET | ORAL | 0 refills | Status: DC | PRN

## 2023-10-04 MED ORDER — ACETAMINOPHEN 500 MG PO TABS
ORAL_TABLET | ORAL | Status: AC
  Filled 2023-10-04: qty 2

## 2023-10-04 MED ORDER — ONDANSETRON HCL 4 MG PO TABS: 4.0000 mg | ORAL_TABLET | Freq: Four times a day (QID) | ORAL | 0 refills | Status: AC | PRN

## 2023-10-04 NOTE — Evaluation (Signed)
Physical Therapy Evaluation Patient Details Name: Rachel Hahn MRN: [id number] DOB: Apr 21, 1956 Today's Date: 10/04/2023  History of Present Illness  Pt is 67 y/o female s/p R reverse TSA.  Clinical Impression  Pt pleasant and motivated, if somewhat anxious, during PT exam and subsequent session.  She was ultimately able to move relatively well and though she adopted a guarded, hesitant posture with standing/ambulation she had no LOBs and only very little actual need for WBing/steadying with PT's hand held support.  She had fatigue and anxiety with the ~60 ft of ambulation effort.  Pt will have 24/7 assist around the home and should be able to d/c home safely with available supervision.        If plan is discharge home, recommend the following: A little help with walking and/or transfers;A little help with bathing/dressing/bathroom;Assistance with cooking/housework;Assist for transportation   Can travel by private vehicle        Equipment Recommendations BSC/3in1  Recommendations for Other Services       Functional Status Assessment Patient has had a recent decline in their functional status and demonstrates the ability to make significant improvements in function in a reasonable and predictable amount of time.     Precautions / Restrictions Precautions Precautions: Shoulder Shoulder Interventions: Off for dressing/bathing/exercises;At all times;Shoulder abduction pillow;Don joy ultra sling Restrictions Weight Bearing Restrictions: Yes RUE Weight Bearing: Non weight bearing      Mobility  Bed Mobility                    Transfers       Sit to Stand: Contact guard assist           General transfer comment: Able to rise to standing w/o physical assist, needed plenty of cuing and encouragement    Ambulation/Gait Ambulation/Gait assistance: Min assist, Contact guard assist Gait Distance (Feet): 60 Feet Assistive device: 1 person hand held assist, None          General Gait Details: Pt with hesitant, guarded posturing and consistently reaching out to hold PT's hand, though she did not overtly need it during actual abmbulation (AKA no LOBs).  slow gait with intermittent use of UEs to stabilize  Stairs            Wheelchair Mobility     Tilt Bed    Modified Rankin (Stroke Patients Only)       Balance Overall balance assessment: Modified Independent                                           Pertinent Vitals/Pain Pain Assessment Pain Assessment: Faces Faces Pain Scale: Hurts little more Pain Location: headache Pain Intervention(s): Monitored during session    Home Living Family/patient expects to be discharged to:: Private residence Living Arrangements: Non-relatives/Friends Available Help at Discharge: Friend(s);Available 24 hours/day Type of Home: House Home Access: Stairs to enter Entrance Stairs-Rails: Doctor, general practice of Steps: 3-4   Home Layout: One level Home Equipment: None (friend has a non-adjustable cane)      Prior Function Prior Level of Function : Independent/Modified Independent                     Extremity/Trunk Assessment   Upper Extremity Assessment Upper Extremity Assessment: RUE deficits/detail RUE Deficits / Details: NWB    Lower Extremity Assessment Lower Extremity Assessment: Generalized weakness  Communication   Communication Communication: No apparent difficulties  Cognition Arousal: Alert Behavior During Therapy: WFL for tasks assessed/performed Overall Cognitive Status: Within Functional Limits for tasks assessed                                          General Comments      Exercises     Assessment/Plan    PT Assessment Patient needs continued PT services  PT Problem List Decreased strength;Decreased range of motion;Decreased activity tolerance;Decreased balance;Decreased mobility;Decreased safety  awareness;Pain       PT Treatment Interventions DME instruction;Gait training;Functional mobility training;Stair training;Therapeutic activities;Balance training;Therapeutic exercise;Patient/family education    PT Goals (Current goals can be found in the Care Plan section)  Acute Rehab PT Goals Patient Stated Goal: go home PT Goal Formulation: With patient Time For Goal Achievement: 10/17/23 Potential to Achieve Goals: Good    Frequency 7X/week     Co-evaluation               AM-PAC PT "6 Clicks" Mobility  Outcome Measure Help needed turning from your back to your side while in a flat bed without using bedrails?: A Little Help needed moving from lying on your back to sitting on the side of a flat bed without using bedrails?: A Little Help needed moving to and from a bed to a chair (including a wheelchair)?: A Little Help needed standing up from a chair using your arms (e.g., wheelchair or bedside chair)?: A Little Help needed to walk in hospital room?: A Little Help needed climbing 3-5 steps with a railing? : A Little 6 Click Score: 18    End of Session Equipment Utilized During Treatment: Gait belt Activity Tolerance: Patient tolerated treatment well Patient left: in bed;with call bell/phone within reach;with family/visitor present Nurse Communication: Mobility status PT Visit Diagnosis: Muscle weakness (generalized) (M62.81);Difficulty in walking, not elsewhere classified (R26.2);Pain Pain - Right/Left: Left Pain - part of body: Shoulder    Time: 9528-4132 PT Time Calculation (min) (ACUTE ONLY): 23 min   Charges:   PT Evaluation $PT Eval Low Complexity: 1 Low PT Treatments $Therapeutic Activity: 8-22 mins PT General Charges $$ ACUTE PT VISIT: 1 Visit         Malachi Pro, DPT 10/04/2023, 1:27 PM

## 2023-10-04 NOTE — Evaluation (Signed)
Occupational Therapy Evaluation Patient Details Name: Rachel Hahn MRN: [id number] DOB: 05/29/1956 Today's Date: 10/04/2023   History of Present Illness Pt is 67 y/o female s/p R reverse TSA.   Clinical Impression   Upon entering the room, pt supine in bed and significant other present for hands on training.Pt reports living at home with boyfriend and being Ind at baseline with ADLs. OT educated pt on NWB precautions, h/w/e exercises, polar care, and sling instructions. Boyfriend, Kathlene November, returned instructions and demonstrations to assist pt with donning polare care and sling this session. OT also providing handouts for pt to refer to at home in regards to this topics. He also took photo to remind himself of proper positioning and how it should look once donned. Pt needing min A to stand from bed and mod A to thread LB clothing on and pull over B hips. He will be home to assist pt as needed. Pt returning to sit on EOB with all needs within reach. All education completed and OT to sign off.       If plan is discharge home, recommend the following: A little help with walking and/or transfers;A lot of help with bathing/dressing/bathroom;Assistance with cooking/housework;Assist for transportation;Help with stairs or ramp for entrance       Equipment Recommendations  BSC/3in1       Precautions / Restrictions Precautions Precautions: Shoulder Shoulder Interventions: Off for dressing/bathing/exercises;At all times;Shoulder abduction pillow;Don joy ultra sling Restrictions Weight Bearing Restrictions: Yes RUE Weight Bearing: Non weight bearing      Mobility Bed Mobility Overal bed mobility: Needs Assistance Bed Mobility: Supine to Sit     Supine to sit: Contact guard          Transfers Overall transfer level: Needs assistance Equipment used: 1 person hand held assist Transfers: Sit to/from Stand Sit to Stand: Contact guard assist, Min assist                      ADL  either performed or assessed with clinical judgement   ADL Overall ADL's : Needs assistance/impaired                                       General ADL Comments: Pt needing mod A for UB self care to don pull over shirt, sling, and polar care. Pt needing mod A to don LB clothing with sit <>stand at EOB.                  Pertinent Vitals/Pain Pain Assessment Pain Assessment: Faces Faces Pain Scale: Hurts little more Pain Location: headache Pain Descriptors / Indicators: Aching Pain Intervention(s): Repositioned, Monitored during session     Extremity/Trunk Assessment Upper Extremity Assessment Upper Extremity Assessment: RUE deficits/detail RUE Deficits / Details: NWB           Communication Communication Communication: No apparent difficulties   Cognition Arousal: Alert Behavior During Therapy: WFL for tasks assessed/performed Overall Cognitive Status: Within Functional Limits for tasks assessed                                                  Home Living Family/patient expects to be discharged to:: Private residence Living Arrangements: Non-relatives/Friends Available Help at Discharge: Friend(s);Available 24 hours/day Type of Home:  House Home Access: Stairs to enter [employer] of Steps: 3-4 Entrance Stairs-Rails: Right;Left Home Layout: One level     Bathroom Shower/Tub: Tub/shower unit         Home Equipment: None          Prior Functioning/Environment Prior Level of Function : Independent/Modified Independent                                 OT Goals(Current goals can be found in the care plan section) Acute Rehab OT Goals Patient Stated Goal: to go home OT Goal Formulation: With patient Time For Goal Achievement: 10/04/23 Potential to Achieve Goals: Fair  OT Frequency:         AM-PAC OT "6 Clicks" Daily Activity     Outcome Measure Help from another person eating meals?:  None Help from another person taking care of personal grooming?: None Help from another person toileting, which includes using toliet, bedpan, or urinal?: A Little Help from another person bathing (including washing, rinsing, drying)?: A Little Help from another person to put on and taking off regular upper body clothing?: A Little Help from another person to put on and taking off regular lower body clothing?: A Little 6 Click Score: 20   End of Session Nurse Communication: Mobility status  Activity Tolerance: Patient tolerated treatment well Patient left: in bed;with call bell/phone within reach;with family/visitor present                   Time: 1040-1100 OT Time Calculation (min): 20 min Charges:  OT General Charges $OT Visit: 1 Visit OT Evaluation $OT Eval Low Complexity: 1 Low OT Treatments $Self Care/Home Management : 8-22 mins  Jackquline Denmark, MS, OTR/L , CBIS ascom [phone]  10/04/23, 1:09 PM

## 2023-10-04 NOTE — Discharge Summary (Signed)
Physician Discharge Summary  Subjective: 1 Day Post-Op Procedure(s) (LRB): Right Reverse Total shoulder arthroplasty, biceps tenodesis (Right) Patient reports pain as mild.   Patient seen in rounds with Dr. Allena Katz. Patient is well, and has had no acute complaints or problems Patient is ready to go home with Outpatient PT  Physician Discharge Summary  Patient ID: Rachel Hahn MRN: [id number] DOB/AGE: [birth year] 67 y.o.  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  Nerve block is still functioning.  Limited pain.  Ready to do PT and go home with Outpatient PT.  Hemovac drain was removed.    Treatments: surgery:  1. Right reverse total shoulder arthroplasty 2. Right biceps tenodesis   SURGEON: Rosealee Albee, MD   ASSISTANTS: Dedra Skeens, PA; Ahmed Prima, PA-S    ANESTHESIA: Gen + interscalene block   ESTIMATED BLOOD LOSS: 300cc   TOTAL IV FLUIDS: see anesthesia record   IMPLANTS: Tornier Perform 25mm Glenoid Baseplate with 3 associated glenoid baseplate screws; 33mm standard glenosphere; Aequalis Flex Revive 9mm Press Fit stem, High (3.75mm) Eccentric Reversed Tray; Reversed Insert +67mm   INDICATION(S):  Rachel Hahn is 67 y.o. female who sustained a proximal humerus fracture over 6 months ago.  She developed a proximal humerus malunion with persistent pain at the end ranges of motion with significant limitation and stiffness in her motion as well. After discussion of risks, benefits, and alternatives to surgery, the patient elected to proceed with reverse shoulder arthroplasty and biceps tenodesis.   OPERATIVE FINDINGS: Proximal humerus malunion, biceps tendinopathy  Discharge Exam: Blood pressure (!) 155/64, pulse 83, temperature 98.2 F (36.8 C), temperature  source Oral, resp. rate 16, height 5\' 1"  (1.549 m), weight 77 kg, SpO2 97%.   Disposition:    Allergies as of 10/04/2023       Reactions   Nitrofurantoin Hives   Nsaids Shortness Of Breath   Prednisone Other (See Comments)   Nervous,jittery, she can tolerate Symbicort.   Codeine Nausea And Vomiting   Ibuprofen Other (See Comments)   Shortness of breath/chest pain   Morphine And Codeine Nausea And Vomiting   Sudafed [pseudoephedrine Hcl] Other (See Comments)   Chest pain/shortness of breath   Sulfa Antibiotics Nausea Only        Medication List     STOP taking these medications    doxycycline 100 MG capsule Commonly known as: VIBRAMYCIN       TAKE these medications    acetaminophen 500 MG tablet Commonly known as: TYLENOL Take 1,000 mg by mouth every 8 (eight) hours as needed.   aspirin EC 325 MG tablet Take 1 tablet (325 mg total) by mouth daily. What changed:  medication strength how much to take when to take this additional instructions   atenolol 100 MG tablet Commonly known as: TENORMIN Take 1 tablet by mouth once daily   clopidogrel 75 MG tablet Commonly known as: PLAVIX Take 1 tablet (75 mg total) by mouth daily at 6 (six) AM.   ondansetron 4 MG tablet Commonly known as: ZOFRAN Take 1 tablet (4 mg total) by mouth every 6 (six) hours as needed for nausea.   oxyCODONE 5 MG immediate release tablet Commonly known as: Oxy IR/ROXICODONE Take 1 tablet (5 mg total) by mouth every 6 (six) hours as needed for severe pain. What changed:  Another medication with the same name was added. Make sure you understand how and when to take each.   oxyCODONE 5 MG immediate release tablet Commonly known as: Oxy IR/ROXICODONE Take 1-2 tablets (5-10 mg total) by mouth every 4 (four) hours as needed for moderate pain (pain score 4-6) (pain score 4-6). What changed: You were already taking a medication with the same name, and this prescription was added. Make sure you  understand how and when to take each.   pantoprazole 40 MG tablet Commonly known as: PROTONIX Take 1 tablet by mouth twice daily   rosuvastatin 40 MG tablet Commonly known as: CRESTOR Take 1 tablet (40 mg total) by mouth daily.   sertraline 100 MG tablet Commonly known as: ZOLOFT Take 1 tablet by mouth once daily   Symbicort 160-4.5 MCG/ACT inhaler Generic drug: budesonide-formoterol Inhale 2 puffs into the lungs 2 (two) times daily as needed.        Follow-up Information     Signa Kell, MD. Go in 2 week(s).   Specialty: Orthopedic Surgery Why: For xrays and wound care Contact information: [address] [place] [address] [phone]                 Signed: Lenard Forth, Joycie Aerts 10/04/2023, 7:15 AM   Objective: Vital signs in last 24 hours: Temp:  [97.4 F (36.3 C)-99.1 F (37.3 C)] 98.2 F (36.8 C) (10/16 0709) Pulse Rate:  [74-87] 83 (10/16 0709) Resp:  [10-27] 16 (10/16 0709) BP: (67-155)/(41-91) 155/64 (10/16 0709) SpO2:  [87 %-100 %] 97 % (10/16 0709) Weight:  [77 kg] 77 kg (10/15 1002)  Intake/Output from previous day:  Intake/Output Summary (Last 24 hours) at 10/04/2023 0716 Last data filed at 10/04/2023 0419 Gross per 24 hour  Intake 2444.51 ml  Output 370 ml  Net 2074.51 ml    Intake/Output this shift: No intake/output data recorded.  Labs: No results for input(s): "HGB" in the last 72 hours. No results for input(s): "WBC", "RBC", "HCT", "PLT" in the last 72 hours. No results for input(s): "NA", "K", "CL", "CO2", "BUN", "CREATININE", "GLUCOSE", "CALCIUM" in the last 72 hours. No results for input(s): "LABPT", "INR" in the last 72 hours.  EXAM: General - Patient is Alert and Oriented Extremity - Neurovascular intact Sensation intact distally Compartment soft Incision - clean, dry, With the hemovac removed.   Motor Function -  Grip intact.    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   Closed fracture of proximal end of right humerus 03/22/2023   COPD exacerbation (HCC)    Depression    GERD (gastroesophageal reflux disease) 2015   History of kidney stones    Hypertension, essential    Kidney stones    Mitral valve prolapse    Mixed hyperlipidemia    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   Weight as of this  encounter: 77 kg. Advance diet Up with therapy D/C IV fluids Diet - Regular diet Follow up - in 2 weeks Activity - Shoulder immobilizer on at all times, unless PT or bathing Disposition - Home Condition Upon Discharge - Stable DVT Prophylaxis - Aspirin  Dedra Skeens, PA-C Orthopaedic Surgery 10/04/2023, 7:16 AM \

## 2023-10-04 NOTE — Progress Notes (Signed)
  Subjective: 1 Day Post-Op Procedure(s) (LRB): Right Reverse Total shoulder arthroplasty, biceps tenodesis (Right) Patient reports pain as mild.   Patient is well, and has had no acute complaints or problems Plan is to go Home after hospital stay. Negative for chest pain and shortness of breath Fever: no Gastrointestinal:Negative for nausea and vomiting  Objective: Vital signs in last 24 hours: Temp:  [97.4 F (36.3 C)-99.1 F (37.3 C)] 97.6 F (36.4 C) (10/16 0400) Pulse Rate:  [74-87] 82 (10/16 0400) Resp:  [10-27] 16 (10/16 0400) BP: (67-137)/(41-91) 122/52 (10/16 0400) SpO2:  [87 %-100 %] 97 % (10/16 0400) Weight:  [77 kg] 77 kg (10/15 1002)  Intake/Output from previous day:  Intake/Output Summary (Last 24 hours) at 10/04/2023 0706 Last data filed at 10/04/2023 0419 Gross per 24 hour  Intake 2444.51 ml  Output 370 ml  Net 2074.51 ml    Intake/Output this shift: No intake/output data recorded.  Labs: No results for input(s): "HGB" in the last 72 hours. No results for input(s): "WBC", "RBC", "HCT", "PLT" in the last 72 hours. No results for input(s): "NA", "K", "CL", "CO2", "BUN", "CREATININE", "GLUCOSE", "CALCIUM" in the last 72 hours. No results for input(s): "LABPT", "INR" in the last 72 hours.   EXAM General - Patient is Alert and Oriented Extremity - Neurovascular intact Sensation intact distally Dressing/Incision - clean, dry, with the hemovac intact.  Hemovac removed with no complication.  Tubing intact. Motor Function - intact, moving fingers well on exam.   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   Weight as of this encounter: 77 kg. Advance diet Up with therapy D/C IV fluids  Discharge planning.  Discharge home with Outpatient PT.  Dressing change as needed.  Pain management.  Followup at [facility] ortho in 2 weeks.    DVT Prophylaxis - Aspirin Shoulder immobilizer on at all times, unless PT or bathing.  Dedra Skeens, PA-C Orthopaedic Surgery 10/04/2023, 7:06 AM

## 2023-10-04 NOTE — TOC CM/SW Note (Signed)
Patient is not able to walk the distance required to go the bathroom, or he/she is unable to safely negotiate stairs required to access the bathroom.  A 3in1 BSC will alleviate this problem

## 2023-10-04 NOTE — Plan of Care (Signed)
Problem: Education: Goal: Knowledge of the prescribed therapeutic regimen will improve Outcome: Progressing   Problem: Pain Management: Goal: Pain level will decrease with appropriate interventions Outcome: Progressing

## 2023-10-04 NOTE — Plan of Care (Signed)
Problem: Activity: Goal: Ability to tolerate increased activity will improve Outcome: Progressing   Problem: Pain Management: Goal: Pain level will decrease with appropriate interventions Outcome: Progressing

## 2023-10-04 NOTE — TOC Transition Note (Signed)
Transition of Care [facility]) - CM/SW Discharge Note   Patient Details  Name: Zeola Brys MRN: [id number] Date of Birth: 26-Sep-1956  Transition of Care [facility]) CM/SW Contact:  Margarito Liner, LCSW Phone Number: 10/04/2023, 12:18 PM   Clinical Narrative: Patient has orders to discharge home today. Patient is agreeable to 3-in-1. No DME agency preference. CSW ordered through [employer]. No further concerns. CSW signing off.    Final next level of care: OP Rehab Barriers to Discharge: No Barriers Identified   Patient Goals and CMS Choice      Discharge Placement                      Patient and family notified of of transfer: 10/04/23  Discharge Plan and Services Additional resources added to the After Visit Summary for                  DME Arranged: 3-N-1 DME Agency: [employer] Date DME Agency Contacted: 10/04/23   Representative spoke with at DME Agency: Yvone Neu            Social Determinants of Health (SDOH) Interventions SDOH Screenings   Food Insecurity: No Food Insecurity (10/03/2023)  Tobacco Use: Medium Risk (10/03/2023)     Readmission Risk Interventions     No data to display

## 2023-10-04 NOTE — Progress Notes (Signed)
DISCHARGE NOTE:  Pt given discharge instructions and verbalized understanding. TED hose on both legs. [facility] sent with pt. Shoulder Immobilizer on, pt wheeled to car by staff, friend providing transportation.

## 2023-10-06 LAB — SURGICAL PATHOLOGY

## 2023-10-06 NOTE — Anesthesia Postprocedure Evaluation (Signed)
Anesthesia Post Note  Patient: Tynia Losacco  Procedure(s) Performed: Right Reverse Total shoulder arthroplasty, biceps tenodesis (Right: Shoulder)  Patient location during evaluation: PACU Anesthesia Type: General Level of consciousness: awake and alert Pain management: pain level controlled Vital Signs Assessment: post-procedure vital signs reviewed and stable Respiratory status: spontaneous breathing, nonlabored ventilation, respiratory function stable and patient connected to nasal cannula oxygen Cardiovascular status: blood pressure returned to baseline and stable Postop Assessment: no apparent nausea or vomiting Anesthetic complications: no   No notable events documented.   Last Vitals:  Vitals:   10/04/23 1300 10/04/23 1411  BP: 101/60 (!) 129/54  Pulse: 81 85  Resp: 16 16  Temp: 36.8 C   SpO2: 96% 95%    Last Pain:  Vitals:   10/04/23 1453  TempSrc:   PainSc: 0-No pain                 Yevette Edwards

## 2023-10-17 ENCOUNTER — Other Ambulatory Visit: Payer: Self-pay | Admitting: Internal Medicine

## 2023-10-17 DIAGNOSIS — M25611 Stiffness of right shoulder, not elsewhere classified: Secondary | ICD-10-CM | POA: Diagnosis not present

## 2023-10-17 DIAGNOSIS — M25511 Pain in right shoulder: Secondary | ICD-10-CM | POA: Diagnosis not present

## 2023-10-17 DIAGNOSIS — Z96611 Presence of right artificial shoulder joint: Secondary | ICD-10-CM | POA: Diagnosis not present

## 2023-10-18 DIAGNOSIS — S42201D Unspecified fracture of upper end of right humerus, subsequent encounter for fracture with routine healing: Secondary | ICD-10-CM | POA: Diagnosis not present

## 2023-10-23 DIAGNOSIS — M25511 Pain in right shoulder: Secondary | ICD-10-CM | POA: Diagnosis not present

## 2023-10-23 DIAGNOSIS — Z96611 Presence of right artificial shoulder joint: Secondary | ICD-10-CM | POA: Diagnosis not present

## 2023-11-06 DIAGNOSIS — Z96611 Presence of right artificial shoulder joint: Secondary | ICD-10-CM | POA: Diagnosis not present

## 2023-11-06 DIAGNOSIS — M25511 Pain in right shoulder: Secondary | ICD-10-CM | POA: Diagnosis not present

## 2023-11-08 ENCOUNTER — Other Ambulatory Visit: Payer: Self-pay | Admitting: Internal Medicine

## 2023-11-08 DIAGNOSIS — I779 Disorder of arteries and arterioles, unspecified: Secondary | ICD-10-CM

## 2023-11-14 DIAGNOSIS — Z96611 Presence of right artificial shoulder joint: Secondary | ICD-10-CM | POA: Diagnosis not present

## 2023-11-15 DIAGNOSIS — S42251A Displaced fracture of greater tuberosity of right humerus, initial encounter for closed fracture: Secondary | ICD-10-CM | POA: Diagnosis not present

## 2023-11-15 DIAGNOSIS — S42201D Unspecified fracture of upper end of right humerus, subsequent encounter for fracture with routine healing: Secondary | ICD-10-CM | POA: Diagnosis not present

## 2023-11-21 DIAGNOSIS — M25611 Stiffness of right shoulder, not elsewhere classified: Secondary | ICD-10-CM | POA: Diagnosis not present

## 2023-11-21 DIAGNOSIS — Z96611 Presence of right artificial shoulder joint: Secondary | ICD-10-CM | POA: Diagnosis not present

## 2023-12-22 DIAGNOSIS — Z96611 Presence of right artificial shoulder joint: Secondary | ICD-10-CM | POA: Diagnosis not present

## 2023-12-22 DIAGNOSIS — M25511 Pain in right shoulder: Secondary | ICD-10-CM | POA: Diagnosis not present

## 2023-12-27 ENCOUNTER — Other Ambulatory Visit: Payer: Self-pay | Admitting: Family

## 2023-12-28 DIAGNOSIS — Z96611 Presence of right artificial shoulder joint: Secondary | ICD-10-CM | POA: Diagnosis not present

## 2023-12-28 DIAGNOSIS — M25511 Pain in right shoulder: Secondary | ICD-10-CM | POA: Diagnosis not present

## 2024-01-03 DIAGNOSIS — M25511 Pain in right shoulder: Secondary | ICD-10-CM | POA: Diagnosis not present

## 2024-01-03 DIAGNOSIS — Z96611 Presence of right artificial shoulder joint: Secondary | ICD-10-CM | POA: Diagnosis not present

## 2024-01-10 ENCOUNTER — Other Ambulatory Visit: Payer: Self-pay | Admitting: Internal Medicine

## 2024-01-11 MED ORDER — PANTOPRAZOLE SODIUM 40 MG PO TBEC: 40.0000 mg | DELAYED_RELEASE_TABLET | Freq: Two times a day (BID) | ORAL | 0 refills | Status: DC

## 2024-01-16 DIAGNOSIS — Z96611 Presence of right artificial shoulder joint: Secondary | ICD-10-CM | POA: Diagnosis not present

## 2024-01-24 DIAGNOSIS — Z96611 Presence of right artificial shoulder joint: Secondary | ICD-10-CM | POA: Diagnosis not present

## 2024-01-24 DIAGNOSIS — M25511 Pain in right shoulder: Secondary | ICD-10-CM | POA: Diagnosis not present

## 2024-02-06 DIAGNOSIS — Z96611 Presence of right artificial shoulder joint: Secondary | ICD-10-CM | POA: Diagnosis not present

## 2024-02-06 DIAGNOSIS — M25511 Pain in right shoulder: Secondary | ICD-10-CM | POA: Diagnosis not present

## 2024-02-07 DIAGNOSIS — S42201D Unspecified fracture of upper end of right humerus, subsequent encounter for fracture with routine healing: Secondary | ICD-10-CM | POA: Diagnosis not present

## 2024-02-22 DIAGNOSIS — M25511 Pain in right shoulder: Secondary | ICD-10-CM | POA: Diagnosis not present

## 2024-02-22 DIAGNOSIS — Z96611 Presence of right artificial shoulder joint: Secondary | ICD-10-CM | POA: Diagnosis not present

## 2024-02-23 ENCOUNTER — Other Ambulatory Visit: Payer: Self-pay | Admitting: Family

## 2024-03-03 ENCOUNTER — Other Ambulatory Visit: Payer: Self-pay | Admitting: Internal Medicine

## 2024-03-03 DIAGNOSIS — I779 Disorder of arteries and arterioles, unspecified: Secondary | ICD-10-CM

## 2024-03-04 ENCOUNTER — Other Ambulatory Visit: Payer: Self-pay

## 2024-03-12 ENCOUNTER — Ambulatory Visit: Admitting: Internal Medicine

## 2024-04-03 ENCOUNTER — Inpatient Hospital Stay: Admission: RE | Admit: 2024-04-03 | Source: Ambulatory Visit

## 2024-04-03 ENCOUNTER — Encounter: Payer: Self-pay | Admitting: Urgent Care

## 2024-04-03 DIAGNOSIS — S42201D Unspecified fracture of upper end of right humerus, subsequent encounter for fracture with routine healing: Secondary | ICD-10-CM | POA: Diagnosis not present

## 2024-04-03 HISTORY — DX: Long term (current) use of antithrombotics/antiplatelets: Z79.02

## 2024-04-04 ENCOUNTER — Encounter
Admission: RE | Admit: 2024-04-04 | Discharge: 2024-04-04 | Disposition: A | Discharge status: Home / Self Care | Source: Ambulatory Visit | Attending: Orthopedic Surgery | Admitting: Orthopedic Surgery

## 2024-04-04 ENCOUNTER — Other Ambulatory Visit: Payer: Self-pay | Admitting: Orthopedic Surgery

## 2024-04-04 ENCOUNTER — Ambulatory Visit
Admission: RE | Admit: 2024-04-04 | Discharge: 2024-04-04 | Disposition: A | Discharge status: Home / Self Care | Source: Ambulatory Visit | Attending: Orthopedic Surgery | Admitting: Orthopedic Surgery

## 2024-04-04 DIAGNOSIS — S4291XD Fracture of right shoulder girdle, part unspecified, subsequent encounter for fracture with routine healing: Secondary | ICD-10-CM | POA: Diagnosis not present

## 2024-04-04 DIAGNOSIS — E782 Mixed hyperlipidemia: Secondary | ICD-10-CM | POA: Diagnosis not present

## 2024-04-04 DIAGNOSIS — R079 Chest pain, unspecified: Secondary | ICD-10-CM

## 2024-04-04 DIAGNOSIS — Z01812 Encounter for preprocedural laboratory examination: Secondary | ICD-10-CM | POA: Diagnosis not present

## 2024-04-04 DIAGNOSIS — Z96611 Presence of right artificial shoulder joint: Secondary | ICD-10-CM | POA: Diagnosis not present

## 2024-04-04 DIAGNOSIS — I10 Essential (primary) hypertension: Secondary | ICD-10-CM | POA: Insufficient documentation

## 2024-04-04 DIAGNOSIS — S42201S Unspecified fracture of upper end of right humerus, sequela: Secondary | ICD-10-CM | POA: Diagnosis not present

## 2024-04-04 DIAGNOSIS — I700 Atherosclerosis of aorta: Secondary | ICD-10-CM | POA: Diagnosis not present

## 2024-04-04 DIAGNOSIS — Z01810 Encounter for preprocedural cardiovascular examination: Secondary | ICD-10-CM

## 2024-04-04 LAB — CBC
HCT: 45.2 % (ref 36.0–46.0)
Hemoglobin: 13.8 g/dL (ref 12.0–15.0)
MCH: 26.2 pg (ref 26.0–34.0)
MCHC: 30.5 g/dL (ref 30.0–36.0)
MCV: 85.8 fL (ref 80.0–100.0)
Platelets: 200 10*3/uL (ref 150–400)
RBC: 5.27 MIL/uL — ABNORMAL HIGH (ref 3.87–5.11)
RDW: 15.1 % (ref 11.5–15.5)
WBC: 7.1 10*3/uL (ref 4.0–10.5)
nRBC: 0 % (ref 0.0–0.2)

## 2024-04-04 LAB — URINALYSIS, COMPLETE (UACMP) WITH MICROSCOPIC
Bilirubin Urine: NEGATIVE
Glucose, UA: NEGATIVE mg/dL
Ketones, ur: NEGATIVE mg/dL
Nitrite: POSITIVE — AB
Protein, ur: 30 mg/dL — AB
Specific Gravity, Urine: 1.013 (ref 1.005–1.030)
WBC, UA: >50 WBC/hpf (ref 0–5)
pH: 6 (ref 5.0–8.0)

## 2024-04-04 LAB — COMPREHENSIVE METABOLIC PANEL WITH GFR
ALT: 13 U/L (ref 0–44)
AST: 12 U/L — ABNORMAL LOW (ref 15–41)
Albumin: 3.7 g/dL (ref 3.5–5.0)
Alkaline Phosphatase: 75 U/L (ref 38–126)
Anion gap: 8 (ref 5–15)
BUN: 16 mg/dL (ref 8–23)
CO2: 26 mmol/L (ref 22–32)
Calcium: 8.9 mg/dL (ref 8.9–10.3)
Chloride: 105 mmol/L (ref 98–111)
Creatinine, Ser: 1.48 mg/dL — ABNORMAL HIGH (ref 0.44–1.00)
GFR, Estimated: 39 mL/min — ABNORMAL LOW (ref >60)
Glucose, Bld: 120 mg/dL — ABNORMAL HIGH (ref 70–99)
Potassium: 3.7 mmol/L (ref 3.5–5.1)
Sodium: 139 mmol/L (ref 135–145)
Total Bilirubin: 0.7 mg/dL (ref 0.0–1.2)
Total Protein: 7.2 g/dL (ref 6.5–8.1)

## 2024-04-04 LAB — SURGICAL PCR SCREEN
MRSA, PCR: NEGATIVE
Staphylococcus aureus: POSITIVE — AB

## 2024-04-05 ENCOUNTER — Other Ambulatory Visit: Payer: Self-pay | Admitting: Internal Medicine

## 2024-04-05 ENCOUNTER — Inpatient Hospital Stay
Admission: RE | Admit: 2024-04-05 | Discharge: 2024-04-05 | Disposition: A | Discharge status: Home / Self Care | Source: Ambulatory Visit

## 2024-04-05 NOTE — Pre-Procedure Instructions (Signed)
 Pt scheduled for surgery 04/09/24. Unable to reach her via phone for Pre op interview. Reviewed information from original shoulder surgery from 09/2023. Pharmacy has reviewed medications. Labs were drawn in office on 04/04/24 and per NP she was given her CHG soap and Benzoyl Peroxide gel along with instructions for use. Sent her our after visit instructions via email. Will attempt to reach out 04/08/24 if time permits (ie Monday census).

## 2024-04-05 NOTE — Patient Instructions (Addendum)
 Your procedure is scheduled on: Tuesday 04/09/24 To find out your arrival time, please call [phone] between 1PM - 3PM on:  Monday 04/08/24  Report to the Registration Desk on the 1st floor of the [facility]. FREE Valet parking is available.  If your arrival time is 6:00 am, do not arrive before that time as the [facility] entrance doors do not open until 6:00 am.  REMEMBER: Instructions that are not followed completely may result in serious medical risk, up to and including death; or upon the discretion of your surgeon and anesthesiologist your surgery may need to be rescheduled.  Do not eat food after midnight the night before surgery.  No gum chewing or hard candies.  You may however, drink CLEAR liquids up to 2 hours before you are scheduled to arrive for your surgery. Do not drink anything within 2 hours of your scheduled arrival time.  Clear liquids include: - water   - apple juice without pulp - gatorade (not RED colors) - black coffee or tea (Do NOT add milk or creamers to the coffee or tea) Do NOT drink anything that is not on this list.  One week prior to surgery: Stop Anti-inflammatories (NSAIDS) such as Advil, Aleve, Ibuprofen, Motrin, Naproxen, Naprosyn and Aspirin  based products such as Excedrin, Goody's Powder, BC Powder. You may however, continue to take Tylenol  if needed for pain up until the day of surgery.  Stop ANY OVER THE COUNTER supplements and vitamins until after surgery.  Continue taking all prescribed medications with the exception of the following: Stop Plavix  as instructed by Dr Lydia Sams  Follow recommendations from Cardiologist or PCP regarding stopping blood thinners.  TAKE ONLY THESE MEDICATIONS THE MORNING OF SURGERY WITH A SIP OF WATER :  atenolol  (TENORMIN ) 100 MG tablet  pantoprazole  (PROTONIX ) 40 MG tablet  rosuvastatin  (CRESTOR ) 40 MG tablet  sertraline  (ZOLOFT ) 100 MG tablet   Use inhalers and nebulizer on the day of surgery and bring  inhaler to the hospital.  No Alcohol for 24 hours before or after surgery.  No Smoking including e-cigarettes for 24 hours before surgery.  No chewable tobacco products for at least 6 hours before surgery.  No nicotine  patches on the day of surgery.  Do not use any "recreational" drugs for at least a week (preferably 2 weeks) before your surgery.  Please be advised that the combination of cocaine and anesthesia may have negative outcomes, up to and including death. If you test positive for cocaine, your surgery will be cancelled.  On the morning of surgery brush your teeth with toothpaste and water , you may rinse your mouth with mouthwash if you wish. Do not swallow any toothpaste or mouthwash.  Use CHG Soap or wipes as directed on instruction sheet.  Do not wear lotions, powders, or perfumes. NO DEODORANT  Do not shave body hair from the neck down 48 hours before surgery.  Wear comfortable clothing (specific to your surgery type) to the hospital.  Do not wear jewelry, make-up, hairpins, clips or nail polish.  For welded (permanent) jewelry: bracelets, anklets, waist bands, etc.  Please have this removed prior to surgery.  If it is not removed, there is a chance that hospital personnel will need to cut it off on the day of surgery. Contact lenses, hearing aids and dentures may not be worn into surgery.  Do not bring valuables to the hospital. [facility] is not responsible for any missing/lost belongings or valuables.   Total Shoulder Arthroplasty:  use Benzoyl  Peroxide 5% Gel as directed on instruction sheet.  Notify your doctor if there is any change in your medical condition (cold, fever, infection).  If you are being discharged the day of surgery, you will not be allowed to drive home. You will need a responsible individual to drive you home and stay with you for 24 hours after surgery.   If you are taking public transportation, you will need to have a responsible individual  with you.  If you are being admitted to the hospital overnight, leave your suitcase in the car. After surgery it may be brought to your room.  In case of increased patient census, it may be necessary for you, the patient, to continue your postoperative care in the Same Day Surgery department.  After surgery, you can help prevent lung complications by doing breathing exercises.  Take deep breaths and cough every 1-2 hours. Your doctor may order a device called an Incentive Spirometer to help you take deep breaths.  Surgery Visitation Policy:  Patients undergoing a surgery or procedure may have two family members or support persons with them as long as the person is not COVID-19 positive or experiencing its symptoms.   Please call the Pre-admissions Testing Dept. at [phone] if you have any questions about these instructions.

## 2024-04-08 ENCOUNTER — Encounter
Admission: RE | Admit: 2024-04-08 | Discharge: 2024-04-08 | Disposition: A | Discharge status: Home / Self Care | Source: Ambulatory Visit | Attending: Orthopedic Surgery | Admitting: Orthopedic Surgery

## 2024-04-08 ENCOUNTER — Other Ambulatory Visit: Payer: Self-pay

## 2024-04-08 MED ORDER — ORAL CARE MOUTH RINSE: 15.0000 mL | Freq: Once | OROMUCOSAL | Status: AC

## 2024-04-08 MED ORDER — TRANEXAMIC ACID-NACL 1000-0.7 MG/100ML-% IV SOLN
1000.0000 mg | INTRAVENOUS | Status: AC
  Administered 2024-04-09: 1000 mg via INTRAVENOUS

## 2024-04-08 MED ORDER — CEFAZOLIN SODIUM-DEXTROSE 2-4 GM/100ML-% IV SOLN
2.0000 g | INTRAVENOUS | Status: AC
  Administered 2024-04-09: 2 g via INTRAVENOUS

## 2024-04-08 MED ORDER — LACTATED RINGERS IV SOLN
INTRAVENOUS | Status: DC
Start: 2024-04-08 — End: 2024-04-09

## 2024-04-08 MED ORDER — CHLORHEXIDINE GLUCONATE 0.12 % MT SOLN
15.0000 mL | Freq: Once | OROMUCOSAL | Status: AC
  Administered 2024-04-09: 15 mL via OROMUCOSAL

## 2024-04-08 NOTE — Patient Instructions (Signed)
 Your procedure is scheduled on: Tuesday 04/09/24 To find out your arrival time, please call [phone] between 1PM - 3PM on:  Monday 04/08/24  Report to the Registration Desk on the 1st floor of the [facility]. FREE Valet parking is available.   If your arrival time is 6:00 am, do not arrive before that time as the [facility] entrance doors do not open until 6:00 am.   REMEMBER: Instructions that are not followed completely may result in serious medical risk, up to and including death; or upon the discretion of your surgeon and anesthesiologist your surgery may need to be rescheduled.   Do not eat food after midnight the night before surgery.  No gum chewing or hard candies.   You may however, drink CLEAR liquids up to 2 hours before you are scheduled to arrive for your surgery. Do not drink anything within 2 hours of your scheduled arrival time.   Clear liquids include: - water   - apple juice without pulp - gatorade (not RED colors) - black coffee or tea (Do NOT add milk or creamers to the coffee or tea) Do NOT drink anything that is not on this list.   One week prior to surgery: Stop Anti-inflammatories (NSAIDS) such as Advil, Aleve, Ibuprofen, Motrin, Naproxen, Naprosyn and Aspirin  based products such as Excedrin, Goody's Powder, BC Powder. You may however, continue to take Tylenol  if needed for pain up until the day of surgery.   Stop ANY OVER THE COUNTER supplements and vitamins until after surgery.   Continue taking all prescribed medications with the exception of the following: Stop Plavix  as instructed by Dr Lydia Sams   Follow recommendations from Cardiologist or PCP regarding stopping blood thinners.   TAKE ONLY THESE MEDICATIONS THE MORNING OF SURGERY WITH A SIP OF WATER :   atenolol  (TENORMIN ) 100 MG tablet  pantoprazole  (PROTONIX ) 40 MG tablet  rosuvastatin  (CRESTOR ) 40 MG tablet  sertraline  (ZOLOFT ) 100 MG tablet    Use inhalers and nebulizer on the day of surgery  and bring inhaler to the hospital.   No Alcohol for 24 hours before or after surgery.   No Smoking including e-cigarettes for 24 hours before surgery.  No chewable tobacco products for at least 6 hours before surgery.  No nicotine  patches on the day of surgery.   Do not use any "recreational" drugs for at least a week (preferably 2 weeks) before your surgery.  Please be advised that the combination of cocaine and anesthesia may have negative outcomes, up to and including death. If you test positive for cocaine, your surgery will be cancelled.   On the morning of surgery brush your teeth with toothpaste and water , you may rinse your mouth with mouthwash if you wish. Do not swallow any toothpaste or mouthwash.   Use CHG Soap or wipes as directed on instruction sheet.   Do not wear lotions, powders, or perfumes. NO DEODORANT   Do not shave body hair from the neck down 48 hours before surgery.   Wear comfortable clothing (specific to your surgery type) to the hospital.   Do not wear jewelry, make-up, hairpins, clips or nail polish.   For welded (permanent) jewelry: bracelets, anklets, waist bands, etc.  Please have this removed prior to surgery.  If it is not removed, there is a chance that hospital personnel will need to cut it off on the day of surgery. Contact lenses, hearing aids and dentures may not be worn into surgery.   Do not bring  valuables to the hospital. [facility] is not responsible for any missing/lost belongings or valuables.    Total Shoulder Arthroplasty:  use Benzoyl Peroxide 5% Gel as directed on instruction sheet.   Notify your doctor if there is any change in your medical condition (cold, fever, infection).   If you are being discharged the day of surgery, you will not be allowed to drive home. You will need a responsible individual to drive you home and stay with you for 24 hours after surgery.    If you are taking public transportation, you will need to have  a responsible individual with you.   If you are being admitted to the hospital overnight, leave your suitcase in the car. After surgery it may be brought to your room.   In case of increased patient census, it may be necessary for you, the patient, to continue your postoperative care in the Same Day Surgery department.   After surgery, you can help prevent lung complications by doing breathing exercises.  Take deep breaths and cough every 1-2 hours. Your doctor may order a device called an Incentive Spirometer to help you take deep breaths.   Surgery Visitation Policy:   Patients undergoing a surgery or procedure may have two family members or support persons with them as long as the person is not COVID-19 positive or experiencing its symptoms.    Please call the Pre-admissions Testing Dept. at [phone] if you have any questions about these instructions.

## 2024-04-08 NOTE — Pre-Procedure Instructions (Signed)
 Was able to reach Rachel Hahn this am. Chart updated and instructions reviewed. Pt verbalized understanding. She will call back with any questions or concerns.

## 2024-04-09 ENCOUNTER — Encounter
Admission: RE | Disposition: A | Discharge status: Home / Self Care | Payer: Self-pay | Source: Home / Self Care | Attending: Orthopedic Surgery

## 2024-04-09 ENCOUNTER — Ambulatory Visit
Admission: RE | Admit: 2024-04-09 | Discharge: 2024-04-09 | Disposition: A | Discharge status: Home / Self Care | Attending: Orthopedic Surgery | Admitting: Orthopedic Surgery

## 2024-04-09 ENCOUNTER — Encounter: Payer: Self-pay | Admitting: Orthopedic Surgery

## 2024-04-09 ENCOUNTER — Inpatient Hospital Stay

## 2024-04-09 ENCOUNTER — Other Ambulatory Visit: Payer: Self-pay

## 2024-04-09 ENCOUNTER — Inpatient Hospital Stay: Admitting: Certified Registered"

## 2024-04-09 DIAGNOSIS — Z86718 Personal history of other venous thrombosis and embolism: Secondary | ICD-10-CM | POA: Insufficient documentation

## 2024-04-09 DIAGNOSIS — Z7902 Long term (current) use of antithrombotics/antiplatelets: Secondary | ICD-10-CM | POA: Diagnosis not present

## 2024-04-09 DIAGNOSIS — J449 Chronic obstructive pulmonary disease, unspecified: Secondary | ICD-10-CM | POA: Insufficient documentation

## 2024-04-09 DIAGNOSIS — M9689 Other intraoperative and postprocedural complications and disorders of the musculoskeletal system: Secondary | ICD-10-CM | POA: Insufficient documentation

## 2024-04-09 DIAGNOSIS — Z96611 Presence of right artificial shoulder joint: Secondary | ICD-10-CM | POA: Diagnosis not present

## 2024-04-09 DIAGNOSIS — Z87891 Personal history of nicotine dependence: Secondary | ICD-10-CM | POA: Diagnosis not present

## 2024-04-09 DIAGNOSIS — S42291P Other displaced fracture of upper end of right humerus, subsequent encounter for fracture with malunion: Secondary | ICD-10-CM | POA: Diagnosis not present

## 2024-04-09 DIAGNOSIS — F32A Depression, unspecified: Secondary | ICD-10-CM | POA: Diagnosis not present

## 2024-04-09 DIAGNOSIS — Z79899 Other long term (current) drug therapy: Secondary | ICD-10-CM | POA: Diagnosis not present

## 2024-04-09 DIAGNOSIS — Z0189 Encounter for other specified special examinations: Secondary | ICD-10-CM | POA: Diagnosis not present

## 2024-04-09 DIAGNOSIS — Z7982 Long term (current) use of aspirin: Secondary | ICD-10-CM | POA: Insufficient documentation

## 2024-04-09 DIAGNOSIS — F419 Anxiety disorder, unspecified: Secondary | ICD-10-CM | POA: Diagnosis not present

## 2024-04-09 DIAGNOSIS — T8489XA Other specified complication of internal orthopedic prosthetic devices, implants and grafts, initial encounter: Secondary | ICD-10-CM | POA: Diagnosis not present

## 2024-04-09 DIAGNOSIS — I10 Essential (primary) hypertension: Secondary | ICD-10-CM | POA: Insufficient documentation

## 2024-04-09 DIAGNOSIS — Z01818 Encounter for other preprocedural examination: Secondary | ICD-10-CM

## 2024-04-09 DIAGNOSIS — J441 Chronic obstructive pulmonary disease with (acute) exacerbation: Secondary | ICD-10-CM | POA: Diagnosis not present

## 2024-04-09 DIAGNOSIS — S42201A Unspecified fracture of upper end of right humerus, initial encounter for closed fracture: Secondary | ICD-10-CM | POA: Diagnosis not present

## 2024-04-09 HISTORY — PX: TOTAL SHOULDER REVISION: SHX6130

## 2024-04-09 LAB — CBC WITH DIFFERENTIAL/PLATELET
Abs Immature Granulocytes: 0.02 10*3/uL (ref 0.00–0.07)
Basophils Absolute: 0 10*3/uL (ref 0.0–0.1)
Basophils Relative: 1 %
Eosinophils Absolute: 0.3 10*3/uL (ref 0.0–0.5)
Eosinophils Relative: 3 %
HCT: 45.7 % (ref 36.0–46.0)
Hemoglobin: 14.3 g/dL (ref 12.0–15.0)
Immature Granulocytes: 0 %
Lymphocytes Relative: 29 %
Lymphs Abs: 2.5 10*3/uL (ref 0.7–4.0)
MCH: 26.6 pg (ref 26.0–34.0)
MCHC: 31.3 g/dL (ref 30.0–36.0)
MCV: 84.9 fL (ref 80.0–100.0)
Monocytes Absolute: 0.7 10*3/uL (ref 0.1–1.0)
Monocytes Relative: 8 %
Neutro Abs: 5.3 10*3/uL (ref 1.7–7.7)
Neutrophils Relative %: 59 %
Platelets: 222 10*3/uL (ref 150–400)
RBC: 5.38 MIL/uL — ABNORMAL HIGH (ref 3.87–5.11)
RDW: 15.1 % (ref 11.5–15.5)
WBC: 8.7 10*3/uL (ref 4.0–10.5)
nRBC: 0 % (ref 0.0–0.2)

## 2024-04-09 LAB — COMPREHENSIVE METABOLIC PANEL WITH GFR
ALT: 15 U/L (ref 0–44)
AST: 16 U/L (ref 15–41)
Albumin: 4 g/dL (ref 3.5–5.0)
Alkaline Phosphatase: 79 U/L (ref 38–126)
Anion gap: 11 (ref 5–15)
BUN: 27 mg/dL — ABNORMAL HIGH (ref 8–23)
CO2: 25 mmol/L (ref 22–32)
Calcium: 9.3 mg/dL (ref 8.9–10.3)
Chloride: 104 mmol/L (ref 98–111)
Creatinine, Ser: 1.48 mg/dL — ABNORMAL HIGH (ref 0.44–1.00)
GFR, Estimated: 39 mL/min — ABNORMAL LOW (ref >60)
Glucose, Bld: 114 mg/dL — ABNORMAL HIGH (ref 70–99)
Potassium: 4.5 mmol/L (ref 3.5–5.1)
Sodium: 140 mmol/L (ref 135–145)
Total Bilirubin: 0.5 mg/dL (ref 0.0–1.2)
Total Protein: 7.8 g/dL (ref 6.5–8.1)

## 2024-04-09 LAB — SEDIMENTATION RATE: Sed Rate: 12 mm/h (ref 0–30)

## 2024-04-09 LAB — C-REACTIVE PROTEIN: CRP: 0.5 mg/dL (ref <1.0)

## 2024-04-09 SURGERY — REVISION, TOTAL ARTHROPLASTY, SHOULDER
Anesthesia: General | Site: Shoulder | Laterality: Right

## 2024-04-09 MED ORDER — EPHEDRINE SULFATE-NACL 50-0.9 MG/10ML-% IV SOSY
PREFILLED_SYRINGE | INTRAVENOUS | Status: DC | PRN
  Administered 2024-04-09: 5 mg via INTRAVENOUS

## 2024-04-09 MED ORDER — TRANEXAMIC ACID-NACL 1000-0.7 MG/100ML-% IV SOLN
INTRAVENOUS | Status: AC
  Filled 2024-04-09: qty 100

## 2024-04-09 MED ORDER — GLYCOPYRROLATE 0.2 MG/ML IJ SOLN
INTRAMUSCULAR | Status: DC | PRN
  Administered 2024-04-09: .2 mg via INTRAVENOUS

## 2024-04-09 MED ORDER — TRANEXAMIC ACID 1000 MG/10ML IV SOLN: 1000.0000 mg | Freq: Once | INTRAVENOUS | Status: DC

## 2024-04-09 MED ORDER — BUPIVACAINE LIPOSOME 1.3 % IJ SUSP
INTRAMUSCULAR | Status: AC
  Filled 2024-04-09: qty 20

## 2024-04-09 MED ORDER — PROPOFOL 10 MG/ML IV BOLUS
INTRAVENOUS | Status: DC | PRN
Start: 2024-04-09 — End: 2024-04-09
  Administered 2024-04-09: 100 mg via INTRAVENOUS
  Administered 2024-04-09: 50 mg via INTRAVENOUS

## 2024-04-09 MED ORDER — MIDAZOLAM HCL 2 MG/2ML IJ SOLN
INTRAMUSCULAR | Status: AC
  Filled 2024-04-09: qty 2

## 2024-04-09 MED ORDER — BUPIVACAINE HCL (PF) 0.5 % IJ SOLN
INTRAMUSCULAR | Status: AC
  Filled 2024-04-09: qty 10

## 2024-04-09 MED ORDER — ONDANSETRON HCL 4 MG/2ML IJ SOLN
INTRAMUSCULAR | Status: DC | PRN
  Administered 2024-04-09 (×2): 4 mg via INTRAVENOUS

## 2024-04-09 MED ORDER — ACETAMINOPHEN 500 MG PO TABS: 1000.0000 mg | ORAL_TABLET | Freq: Three times a day (TID) | ORAL | 2 refills | Status: AC

## 2024-04-09 MED ORDER — THROMBIN 5000 UNITS EX KIT
PACK | CUTANEOUS | Status: AC
  Filled 2024-04-09: qty 1

## 2024-04-09 MED ORDER — DEXAMETHASONE SODIUM PHOSPHATE 10 MG/ML IJ SOLN
INTRAMUSCULAR | Status: AC
  Filled 2024-04-09: qty 1

## 2024-04-09 MED ORDER — FENTANYL CITRATE (PF) 100 MCG/2ML IJ SOLN
INTRAMUSCULAR | Status: AC
  Filled 2024-04-09: qty 2

## 2024-04-09 MED ORDER — ACETAMINOPHEN 10 MG/ML IV SOLN
INTRAVENOUS | Status: DC | PRN
  Administered 2024-04-09: 1000 mg via INTRAVENOUS

## 2024-04-09 MED ORDER — PHENYLEPHRINE 80 MCG/ML (10ML) SYRINGE FOR IV PUSH (FOR BLOOD PRESSURE SUPPORT)
PREFILLED_SYRINGE | INTRAVENOUS | Status: DC | PRN
  Administered 2024-04-09: 160 ug via INTRAVENOUS
  Administered 2024-04-09 (×4): 80 ug via INTRAVENOUS

## 2024-04-09 MED ORDER — LIDOCAINE HCL (PF) 1 % IJ SOLN
INTRAMUSCULAR | Status: AC
  Filled 2024-04-09: qty 5

## 2024-04-09 MED ORDER — DEXAMETHASONE SODIUM PHOSPHATE 10 MG/ML IJ SOLN
INTRAMUSCULAR | Status: DC | PRN
  Administered 2024-04-09: 5 mg via INTRAVENOUS

## 2024-04-09 MED ORDER — VANCOMYCIN HCL 1000 MG IV SOLR
INTRAVENOUS | Status: DC | PRN
  Administered 2024-04-09: 500 mg via TOPICAL

## 2024-04-09 MED ORDER — FENTANYL CITRATE (PF) 100 MCG/2ML IJ SOLN: INTRAMUSCULAR | Status: DC | PRN

## 2024-04-09 MED ORDER — ACETAMINOPHEN 10 MG/ML IV SOLN
INTRAVENOUS | Status: AC
  Filled 2024-04-09: qty 100

## 2024-04-09 MED ORDER — ONDANSETRON HCL 4 MG/2ML IJ SOLN
INTRAMUSCULAR | Status: AC
  Filled 2024-04-09: qty 4

## 2024-04-09 MED ORDER — PHENYLEPHRINE HCL-NACL 20-0.9 MG/250ML-% IV SOLN
INTRAVENOUS | Status: DC | PRN
  Administered 2024-04-09: 15 ug/min via INTRAVENOUS

## 2024-04-09 MED ORDER — CEFAZOLIN SODIUM-DEXTROSE 2-4 GM/100ML-% IV SOLN
INTRAVENOUS | Status: AC
  Filled 2024-04-09: qty 100

## 2024-04-09 MED ORDER — OXYCODONE HCL 5 MG PO TABS: 5.0000 mg | ORAL_TABLET | ORAL | 0 refills | Status: AC | PRN

## 2024-04-09 MED ORDER — LIDOCAINE HCL (CARDIAC) PF 100 MG/5ML IV SOSY
PREFILLED_SYRINGE | INTRAVENOUS | Status: DC | PRN
  Administered 2024-04-09: 40 mg via INTRAVENOUS

## 2024-04-09 MED ORDER — ONDANSETRON 4 MG PO TBDP: 4.0000 mg | ORAL_TABLET | Freq: Three times a day (TID) | ORAL | 0 refills | Status: AC | PRN

## 2024-04-09 MED ORDER — VANCOMYCIN HCL 1000 MG IV SOLR
INTRAVENOUS | Status: AC
  Filled 2024-04-09: qty 20

## 2024-04-09 MED ORDER — SUCCINYLCHOLINE CHLORIDE 200 MG/10ML IV SOSY
PREFILLED_SYRINGE | INTRAVENOUS | Status: DC | PRN
  Administered 2024-04-09: 100 mg via INTRAVENOUS

## 2024-04-09 MED ORDER — PHENYLEPHRINE 80 MCG/ML (10ML) SYRINGE FOR IV PUSH (FOR BLOOD PRESSURE SUPPORT)
PREFILLED_SYRINGE | INTRAVENOUS | Status: AC
  Filled 2024-04-09: qty 10

## 2024-04-09 MED ORDER — TRANEXAMIC ACID-NACL 1000-0.7 MG/100ML-% IV SOLN
1000.0000 mg | INTRAVENOUS | Status: AC
  Administered 2024-04-09: 1000 mg via INTRAVENOUS

## 2024-04-09 MED ORDER — EPHEDRINE 5 MG/ML INJ
INTRAVENOUS | Status: AC
  Filled 2024-04-09: qty 5

## 2024-04-09 MED ORDER — CHLORHEXIDINE GLUCONATE 0.12 % MT SOLN
OROMUCOSAL | Status: AC
  Filled 2024-04-09: qty 15

## 2024-04-09 MED ORDER — 0.9 % SODIUM CHLORIDE (POUR BTL) OPTIME
TOPICAL | Status: DC | PRN
  Administered 2024-04-09: 500 mL

## 2024-04-09 MED ORDER — IRRISEPT - 450ML BOTTLE WITH 0.05% CHG IN STERILE WATER, USP 99.95% OPTIME
TOPICAL | Status: DC | PRN
  Administered 2024-04-09: 450 mL

## 2024-04-09 MED ORDER — ASPIRIN 325 MG PO TBEC
325.0000 mg | DELAYED_RELEASE_TABLET | Freq: Every day | ORAL | 0 refills | Status: AC
Start: 2024-04-09 — End: 2024-04-23

## 2024-04-09 MED ORDER — ROCURONIUM BROMIDE 100 MG/10ML IV SOLN
INTRAVENOUS | Status: DC | PRN
  Administered 2024-04-09: 20 mg via INTRAVENOUS

## 2024-04-09 MED ORDER — SUGAMMADEX SODIUM 200 MG/2ML IV SOLN
INTRAVENOUS | Status: DC | PRN
  Administered 2024-04-09: 100 mg via INTRAVENOUS

## 2024-04-09 MED ORDER — MIDAZOLAM HCL 2 MG/2ML IJ SOLN
1.0000 mg | INTRAMUSCULAR | Status: AC | PRN
  Administered 2024-04-09 (×2): 1 mg via INTRAVENOUS

## 2024-04-09 MED ORDER — PHENYLEPHRINE HCL-NACL 20-0.9 MG/250ML-% IV SOLN
INTRAVENOUS | Status: AC
  Filled 2024-04-09: qty 250

## 2024-04-09 SURGICAL SUPPLY — 65 items
BLADE SAGITTAL WIDE XTHICK NO (BLADE) ×1 IMPLANT
BLADE SURG 15 STRL LF DISP TIS (BLADE) ×1 IMPLANT
BOWL CEMENT MIX W/ADAPTER (MISCELLANEOUS) IMPLANT
CHLORAPREP W/TINT 26 (MISCELLANEOUS) ×1 IMPLANT
CNTNR URN SCR LID CUP LEK RST (MISCELLANEOUS) ×1 IMPLANT
COOLER POLAR GLACIER W/PUMP (MISCELLANEOUS) ×1 IMPLANT
DERMABOND ADVANCED .7 DNX12 (GAUZE/BANDAGES/DRESSINGS) ×2 IMPLANT
DRAPE C-ARM XRAY 36X54 (DRAPES) IMPLANT
DRAPE INCISE IOBAN 66X45 STRL (DRAPES) ×2 IMPLANT
DRAPE SHEET LG 3/4 BI-LAMINATE (DRAPES) ×2 IMPLANT
DRAPE TABLE BACK 80X90 (DRAPES) ×1 IMPLANT
DRAPE U-SHAPE 47X51 STRL (DRAPES) ×1 IMPLANT
DRSG OPSITE POSTOP 4X8 (GAUZE/BANDAGES/DRESSINGS) ×1 IMPLANT
DRSG TEGADERM 2-3/8X2-3/4 SM (GAUZE/BANDAGES/DRESSINGS) ×1 IMPLANT
ELECTRODE REM PT RTRN 9FT ADLT (ELECTROSURGICAL) ×1 IMPLANT
EVACUATOR 1/8 PVC DRAIN (DRAIN) IMPLANT
GAUZE SPONGE 2X2 STRL 8-PLY (GAUZE/BANDAGES/DRESSINGS) ×1 IMPLANT
GAUZE XEROFORM 1X8 LF (GAUZE/BANDAGES/DRESSINGS) ×1 IMPLANT
GLOVE BIO SURGEON STRL SZ7.5 (GLOVE) ×1 IMPLANT
GLOVE BIOGEL PI IND STRL 8 (GLOVE) ×2 IMPLANT
GLOVE SURG ORTHO 8.0 STRL STRW (GLOVE) ×1 IMPLANT
GOWN STRL REUS W/ TWL LRG LVL3 (GOWN DISPOSABLE) ×2 IMPLANT
GOWN STRL REUS W/ TWL XL LVL3 (GOWN DISPOSABLE) ×1 IMPLANT
HEMOSTAT SURGICEL 2X14 (HEMOSTASIS) IMPLANT
HEMOSTAT SURGICEL 2X3 (HEMOSTASIS) ×2 IMPLANT
HOLDER FOLEY CATH W/STRAP (MISCELLANEOUS) ×1 IMPLANT
HOOD PEEL AWAY T7 (MISCELLANEOUS) ×3 IMPLANT
KIT STABILIZATION SHOULDER (MISCELLANEOUS) ×1 IMPLANT
KIT TURNOVER KIT A (KITS) ×1 IMPLANT
LAVAGE JET IRRISEPT WOUND (IRRIGATION / IRRIGATOR) IMPLANT
MANIFOLD NEPTUNE II (INSTRUMENTS) ×1 IMPLANT
MASK FACE SPIDER DISP (MASK) ×1 IMPLANT
MAT ABSORB FLUID 56X50 GRAY (MISCELLANEOUS) ×1 IMPLANT
NDL REVERSE CUT 1/2 CRC (NEEDLE) ×1 IMPLANT
NDL SAFETY ECLIPSE 18X1.5 (NEEDLE) ×1 IMPLANT
NDL SPNL 20GX3.5 QUINCKE YW (NEEDLE) ×1 IMPLANT
NEEDLE REVERSE CUT 1/2 CRC (NEEDLE) IMPLANT
NEEDLE SPNL 20GX3.5 QUINCKE YW (NEEDLE) ×1 IMPLANT
NS IRRIG 1000ML POUR BTL (IV SOLUTION) ×1 IMPLANT
NS IRRIG 500ML POUR BTL (IV SOLUTION) IMPLANT
PACK ARTHROSCOPY SHOULDER (MISCELLANEOUS) ×1 IMPLANT
PAD ARMBOARD POSITIONER FOAM (MISCELLANEOUS) ×2 IMPLANT
PAD WRAPON POLAR SHDR UNIV (MISCELLANEOUS) ×1 IMPLANT
PENCIL SMOKE EVACUATOR (MISCELLANEOUS) IMPLANT
SLING ARM M TX990204 (SOFTGOODS) IMPLANT
SLING ULTRA II LG (MISCELLANEOUS) IMPLANT
SLING ULTRA II M (MISCELLANEOUS) IMPLANT
SOL .9 NS 3000ML IRR UROMATIC (IV SOLUTION) ×1 IMPLANT
SPONGE T-LAP 18X18 ~~LOC~~+RFID (SPONGE) ×4 IMPLANT
STRAP SAFETY 5IN WIDE (MISCELLANEOUS) ×2 IMPLANT
SUT BONE WAX W31G (SUTURE) IMPLANT
SUT MNCRL AB 4-0 PS2 18 (SUTURE) IMPLANT
SUT TICRON 2-0 30IN 311381 (SUTURE) ×3 IMPLANT
SUT VIC AB 0 CT1 27XCR 8 STRN (SUTURE) ×1 IMPLANT
SUT VIC AB 0 CT1 36 (SUTURE) IMPLANT
SUT VIC AB 2-0 CT2 27 (SUTURE) ×2 IMPLANT
SUTURE FIBERWR #2 38 BLUE 1/2 (SUTURE) ×4 IMPLANT
SYR 20ML LL LF (SYRINGE) ×1 IMPLANT
SYR 30ML LL (SYRINGE) ×2 IMPLANT
SYR 5ML LL (SYRINGE) ×1 IMPLANT
SYRINGE TOOMEY IRRIG 70ML (MISCELLANEOUS) ×1 IMPLANT
TIP FAN IRRIG PULSAVAC PLUS (DISPOSABLE) ×1 IMPLANT
TRAP FLUID SMOKE EVACUATOR (MISCELLANEOUS) ×1 IMPLANT
TRAY FOLEY SLVR 16FR LF STAT (SET/KITS/TRAYS/PACK) ×1 IMPLANT
WATER STERILE IRR 500ML POUR (IV SOLUTION) ×1 IMPLANT

## 2024-04-09 NOTE — Anesthesia Procedure Notes (Signed)
 Procedure Name: Intubation Date/Time: 04/09/2024 1:51 PM  Performed by: Niki Barter, CRNAPre-anesthesia Checklist: Patient identified, Emergency Drugs available, Suction available and Patient being monitored Patient Re-evaluated:Patient Re-evaluated prior to induction Oxygen Delivery Method: Circle system utilized Preoxygenation: Pre-oxygenation with 100% oxygen Induction Type: IV induction Ventilation: Mask ventilation without difficulty Laryngoscope Size: McGrath and 3 Grade View: Grade I Tube type: Oral Tube size: 7.0 mm Number of attempts: 1 Airway Equipment and Method: Stylet Placement Confirmation: ETT inserted through vocal cords under direct vision, positive ETCO2 and breath sounds checked- equal and bilateral Secured at: 21 cm Tube secured with: Tape Dental Injury: Teeth and Oropharynx as per pre-operative assessment

## 2024-04-09 NOTE — Discharge Instructions (Addendum)
 Post-Op Instructions - Shoulder Capsular Release/Manipulation Under Anesthesia  1. Bracing: You should wear a sling for comfort only. Sling should NOT be worn longer than ~1-2 weeks.   2. Driving: No driving for 1 week post-op. Must be off narcotic pain medication.  3. Activity: No active lifting for ~2 weeks. Perform range of motion exercises DAILY at home and with physical therapy as prescribed.   4. Physical Therapy: Start 3-5 days after surgery and proceed ~6-12 weeks. This should be at least 2x/week.   5. Medications:  - You will be provided a prescription for narcotic pain medicine. After surgery, take 1-2 narcotic tablets every 4 hours if needed for severe pain.  - A prescription for anti-nausea medication will be provided in case the narcotic medicine causes nausea - take 1 tablet every 6 hours only if nauseated.   - Take tylenol  1000 mg (2 Extra Strength tablets or 3 regular strength) every 8 hours for pain.  May decrease or stop tylenol  5 days after surgery if you are having minimal pain. - Take ibuprofen 800mg  three times/day with food for at least two weeks every day. This will help reduce post-operative inflammation and swelling. Please call our offices if this causes any stomach/GI irritation.  - Take Aspirin  325mg /daily x 2 weeks to help prevent DVT/PE (Blood clots)   If you are taking prescription medication for anxiety, depression, insomnia, muscle spasm, chronic pain, or for attention deficit disorder, you are advised that you are at a higher risk of adverse effects with use of narcotics post-op, including narcotic addiction/dependence, depressed breathing, death. If you use non-prescribed substances: alcohol, marijuana, cocaine, heroin, methamphetamines, etc., you are at a higher risk of adverse effects with use of narcotics post-op, including narcotic addiction/dependence, depressed breathing, death. You are advised that taking > 50 morphine  milligram equivalents (MME) of  narcotic pain medication per day results in twice the risk of overdose or death. For your prescription provided: oxycodone  5 mg - taking more than 6 tablets per day would result in > 50 morphine  milligram equivalents (MME) of narcotic pain medication. Be advised that we will prescribe narcotics short-term, for acute post-operative pain only - 3 weeks for major operations such as shoulder repair/reconstruction surgeries.    6. Post-Op Appointment:  Your first post-op appointment will be ~2 weeks post-op.  7. Work or School: For most, but not all procedures, we advise staying out of work or school for at least 1 to 2 weeks in order to recover from the stress of surgery and to allow time for healing.   If you need a work or school note this can be provided.      POLAR CARE INFORMATION  MassAdvertisement.it  How to use [facility] Polar Care [facility] Therapy System?  YouTube   ShippingScam.co.uk  OPERATING INSTRUCTIONS  Start the product With dry hands, connect the transformer to the electrical connection located on the top of the cooler. Next, plug the transformer into an appropriate electrical outlet. The unit will automatically start running at this point.  To stop the pump, disconnect electrical power.  Unplug to stop the product when not in use. Unplugging the Polar Care unit turns it off. Always unplug immediately after use. Never leave it plugged in while unattended. Remove pad.    FIRST ADD WATER  TO FILL LINE, THEN ICE---Replace ice when existing ice is almost melted  1 Discuss Treatment with your Licensed Health Care Practitioner and Use Only as Prescribed 2 Apply Insulation Barrier & Cold  Therapy Pad 3 Check for Moisture 4 Inspect Skin Regularly  Tips and Trouble Shooting Usage Tips 1. Use cubed or chunked ice for optimal performance. 2. It is recommended to drain the Pad between uses. To drain the pad, hold the Pad upright with the hose pointed toward the  ground. Depress the black plunger and allow water  to drain out. 3. You may disconnect the Pad from the unit without removing the pad from the affected area by depressing the silver tabs on the hose coupling and gently pulling the hoses apart. The Pad and unit will seal itself and will not leak. Note: Some dripping during release is normal. 4. DO NOT RUN PUMP WITHOUT WATER ! The pump in this unit is designed to run with water . Running the unit without water  will cause permanent damage to the pump. 5. Unplug unit before removing lid.  TROUBLESHOOTING GUIDE Pump not running, Water  not flowing to the pad, Pad is not getting cold 1. Make sure the transformer is plugged into the wall outlet. 2. Confirm that the ice and water  are filled to the indicated levels. 3. Make sure there are no kinks in the pad. 4. Gently pull on the blue tube to make sure the tube/pad junction is straight. 5. Remove the pad from the treatment site and ll it while the pad is lying at; then reapply. 6. Confirm that the pad couplings are securely attached to the unit. Listen for the double clicks (Figure 1) to confirm the pad couplings are securely attached.  Leaks    Note: Some condensation on the lines, controller, and pads is unavoidable, especially in warmer climates. 1. If using a [facility] Polar Care Cold Therapy unit with a detachable Cold Therapy Pad, and a leak exists (other than condensation on the lines) disconnect the pad couplings. Make sure the silver tabs on the couplings are depressed before reconnecting the pad to the pump hose; then confirm both sides of the coupling are properly clicked in. 2. If the coupling continues to leak or a leak is detected in the pad itself, stop using it and call [employer] Customer Care at [phone].  Cleaning After use, empty and dry the unit with a soft cloth. Warm water  and mild detergent may be used occasionally to clean the pump and tubes.  WARNING: The Polar Care Cube can be cold  enough to cause serious injury, including full skin necrosis. Follow these Operating Instructions, and carefully read the Product Insert (see pouch on side of unit) and the Cold Therapy Pad Fitting Instructions (provided with each Cold Therapy Pad) prior to use.    Interscalene Nerve Block with Exparel    For your surgery you have received an Interscalene Nerve Block with Exparel . Nerve Blocks affect many types of nerves, including nerves that control movement, pain and normal sensation.  You may experience feelings such as numbness, tingling, heaviness, weakness or the inability to move your arm or the feeling or sensation that your arm has "fallen asleep". A nerve block with Exparel  can last up to 5 days.  Usually the weakness wears off first.  The tingling and heaviness usually wear off next.  Finally you may start to notice pain.  Keep in mind that this may occur in any order.  Once a nerve block starts to wear off it is usually completely gone within 60 minutes. ISNB may cause mild shortness of breath, a hoarse voice, blurry vision, unequal pupils, or drooping of the face on the same side as the  nerve block.  These symptoms will usually resolve with the numbness.  Very rarely the procedure itself can cause mild seizures. If needed, your surgeon will give you a prescription for pain medication.  It will take about 60 minutes for the oral pain medication to become fully effective.  So, it is recommended that you start taking this medication before the nerve block first begins to wear off, or when you first begin to feel discomfort. Take your pain medication only as prescribed.  Pain medication can cause sedation and decrease your breathing if you take more than you need for the level of pain that you have. Nausea is a common side effect of many pain medications.  You may want to eat something before taking your pain medicine to prevent nausea. After an Interscalene nerve block, you cannot feel pain,  pressure or extremes in temperature in the effected arm.  Because your arm is numb it is at an increased risk for injury.  To decrease the possibility of injury, please practice the following:  While you are awake change the position of your arm frequently to prevent too much pressure on any one area for prolonged periods of time.  If you have a cast or tight dressing, check the color or your fingers every couple of hours.  Call your surgeon with the appearance of any discoloration (white or blue). If you are given a sling to wear before you go home, please wear it  at all times until the block has completely worn off.  Do not get up at night without your sling. Please contact [facility] or your surgeon if you do not begin to regain sensation after 7 days from the surgery.  Anesthesia may be contacted by calling the Same Day Surgery Department, Mon. through Fri., 6 am to 4 pm at [phone].   If you experience any other problems or concerns, please contact your surgeon's office. If you experience severe or prolonged shortness of breath go to the nearest emergency department.

## 2024-04-09 NOTE — Anesthesia Preprocedure Evaluation (Signed)
 Anesthesia Evaluation  Patient identified by MRN, date of birth, ID band Patient awake    Reviewed: Allergy & Precautions, H&P , NPO status , Patient's Chart, lab work & pertinent test results, reviewed documented beta blocker date and time   History of Anesthesia Complications (+) PONV and history of anesthetic complications  Airway Mallampati: II  TM Distance: >3 FB Neck ROM: full    Dental  (+) Teeth Intact   Pulmonary neg shortness of breath, COPD, former smoker   Pulmonary exam normal        Cardiovascular Exercise Tolerance: Good hypertension, On Medications + Peripheral Vascular Disease  Normal cardiovascular exam Rhythm:regular Rate:Normal     Neuro/Psych  PSYCHIATRIC DISORDERS Anxiety Depression     Neuromuscular disease    GI/Hepatic Neg liver ROS,GERD  ,,  Endo/Other  negative endocrine ROS    Renal/GU Renal disease  negative genitourinary   Musculoskeletal   Abdominal   Peds  Hematology negative hematology ROS (+)   Anesthesia Other Findings Past Medical History: No date: Age related osteoporosis No date: Anxiety 05/2022: 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) 03/22/2023: Closed fracture of proximal end of right humerus No date: COPD exacerbation (HCC) No date: Depression 2015: GERD (gastroesophageal reflux disease) No date: Hypertension, essential No date: Kidney stones No date: Long term current use of clopidogrel  No date: Mitral valve prolapse No date: Mixed hyperlipidemia 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 No date: Peripheral arterial occlusive disease (HCC) No date: PONV (postoperative nausea and vomiting) 11/2021: 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 Past Surgical History: 06/08/2022: AORTIC INTERVENTION; N/A     Comment:  Procedure: AORTIC INTERVENTION;  Surgeon: Celso College,               MD;  Location: [facility];  Service:                Cardiovascular;  Laterality: N/A; No date: CYSTOSCOPY WITH HOLMIUM LASER LITHOTRIPSY; Bilateral 12/29/2021: ELBOW ARTHROPLASTY; Right     Comment:  radial tunnel release No date: KIDNEY CYST REMOVAL No date: LITHOTRIPSY 10/03/2023: REVERSE SHOULDER ARTHROPLASTY; Right     Comment:  Procedure: Right Reverse Total shoulder arthroplasty,               biceps tenodesis;  Surgeon: Lorri Rota, MD;  Location:               [facility] OR[facility];  Service: Orthopedics;  Laterality: Right; No date: STRABISMUS SURGERY; Bilateral No date: TONSILLECTOMY   Reproductive/Obstetrics negative OB ROS                             Anesthesia Physical Anesthesia Plan  ASA: 3  Anesthesia Plan: General ETT   Post-op Pain Management: Regional block*   Induction:   PONV Risk Score and Plan: 4 or greater  Airway Management Planned:   Additional Equipment:   Intra-op Plan:   Post-operative Plan:   Informed Consent: I have reviewed the patients History and Physical, chart, labs and discussed the procedure including the risks, benefits and alternatives for the proposed anesthesia with the patient or authorized representative who has indicated his/her understanding and acceptance.     Dental Advisory Given  Plan Discussed with: CRNA  Anesthesia Plan Comments:        Anesthesia Quick Evaluation

## 2024-04-09 NOTE — Transfer of Care (Signed)
 Immediate Anesthesia Transfer of Care Note  Patient: Rachel Hahn  Procedure(s) Performed: REVISION, TOTAL ARTHROPLASTY, SHOULDER (Right: Shoulder)  Patient Location: PACU  Anesthesia Type:General  Level of Consciousness: drowsy  Airway & Oxygen Therapy: Patient Spontanous Breathing and Patient connected to face mask oxygen  Post-op Assessment: Report given to RN and Post -op Vital signs reviewed and stable  Post vital signs: Reviewed and stable  Last Vitals:  Vitals Value Taken Time  BP 103/73 04/09/24 1551  Temp    Pulse 84 04/09/24 1554  Resp 15 04/09/24 1554  SpO2 100 % 04/09/24 1554  Vitals shown include unfiled device data.  Last Pain:  Vitals:   04/09/24 1258  PainSc: 0-No pain         Complications: No notable events documented.

## 2024-04-09 NOTE — H&P (Signed)
 Paper H&P to be scanned into permanent record. H&P reviewed. No significant changes noted.

## 2024-04-09 NOTE — Op Note (Signed)
 SURGERY DATE: 04/09/2024   PRE-OP DIAGNOSIS:  1. 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 Right greater tuberosity excision   SURGEON: Cleotilde Dago, MD  ASSISTANTS: Bert Britain, PA   ANESTHESIA: Gen + interscalene block   ESTIMATED BLOOD LOSS: 200cc   TOTAL IV FLUIDS: per anesthesia record   INDICATION(S):  Rachel Hahn is a 68 y.o. female who had a history of a proximal humerus fracture that resulted in symptomatic malunion.  She underwent reverse shoulder arthroplasty approximately 6 months ago.  Postoperatively, she was noted to have displacement of the greater tuberosity fragment.  She has had a persistent block to motion with abduction, forward flexion, and abduction/external rotation imaging consistent with subacromial impingement from the greater tuberosity fragment.  Range of motion is associated with significant pain. Conservative measures including medications and physical therapy have not improved her symptoms.  She has made no progress with improvement in her symptoms over the past 2 months.  After discussion of risks, benefits, and alternatives to surgery, the patient elected to proceed with above surgery.  OPERATIVE FINDINGS: Malunion of the greater tuberosity fragment.  Preoperative passive range of motion with scapular stable was 90 degrees forward flexion, 45 degrees abduction, and 30 degrees abduction/external rotation.   OPERATIVE REPORT:   I identified Rachel Hahn in the pre-operative holding area. Informed consent was obtained and the surgical site was marked. I reviewed the risks and benefits of the proposed surgical intervention and the patient wished to proceed. An interscalene block with Exparel  was administered by the Anesthesia team. The patient was transferred to the operative suite and general anesthesia was  administered. The patient was placed in the beach chair position with the head of the bed elevated approximately 45 degrees. All down side pressure points were appropriately padded. Pre-op exam under anesthesia confirmed some stiffness and crepitus. Appropriate IV antibiotics were administered. The extremity was then prepped and draped in standard fashion. A time out was performed confirming the correct extremity, correct patient, and correct procedure.   We used the previous deltopectoral incision from the coracoid to ~12cm distal.  The cephalic vein was not noted.  There was a significant amount of scar tissue that was dissected in order to appropriately open the deltopectoral interval widely.  Retracted were placed under the CA ligament in the subacromial space and under the deltoid tendon at its insertion.   A plane was developed under the conjoined tendon.  We gently palpated the axillary nerve and verified its position and continuity on both sides of the humerus with a Tug test. At this point, we could see significant prominence of the greater tuberosity.  There was a fibrous union of the greater tuberosity as it was slightly mobile.  Fluoroscopy was used to confirm the extent of the greater tuberosity fragment.  Using combination of an osteotome, Therapist, nutritional, and a Kocher clamp, the greater tuberosity fragment was removed in 2 pieces.  Rongeur was used to remove any other small bone fragments.  The shoulder was again taken through range of motion and passive range of motion with the scapula stable was she was noted to have 120 degrees forward flexion, 70 degrees abduction, and 80 degrees external rotation with the arm abducted.  Of note, there were no gross signs of infection, but four culture samples were sent for pathology with a 21-day hold.  Additionally glenoid  and humeral components appear to be stable.  Therefore, the shoulder was not dislocated.  The subscapularis/lesser tuberosity was left  intact.  The wound was thoroughly irrigated with Irrisept solution as well as with normal saline.  Vancomycin  powder was also placed in the wound. We again verified the tension on the axillary nerve. We closed the deltopectoral interval with a running, 0-Vicryl suture. The skin was closed with 2-0 Vicryl and 4 staples. Xeroform and Honeycomb dressing was applied. A PolarCare unit and sling were placed. Patient was extubated, transferred to a stretcher bed and to the post anesthesia care unit in stable condition.   Of note, assistance from a PA was essential to performing the surgery.  PA was present for the entire surgery.  PA assisted with patient positioning, retraction, instrumentation, and wound closure. The surgery would have been more difficult and had longer operative time without PA assistance.    POSTOPERATIVE PLAN: The patient will be admitted with plan for discharge home as an outpatient.  Patient should wear sling for comfort only for 1-2 weeks.  Recommend early passive range of motion without restrictions.  Can progress to active range of motion as tolerated. ASA 325mg  x 2 weeks for DVT ppx. Plan for PT starting within 1 week. Patient to return to clinic in ~2 weeks for post-operative appointment.

## 2024-04-10 ENCOUNTER — Encounter: Payer: Self-pay | Admitting: Orthopedic Surgery

## 2024-04-10 NOTE — Anesthesia Postprocedure Evaluation (Signed)
 Anesthesia Post Note  Patient: Rachel Hahn  Procedure(s) Performed: Right Shoulder greater tuberosity excision (Right: Shoulder)  Patient location during evaluation: PACU Anesthesia Type: General Level of consciousness: awake and alert Pain management: pain level controlled Vital Signs Assessment: post-procedure vital signs reviewed and stable Respiratory status: spontaneous breathing, nonlabored ventilation, respiratory function stable and patient connected to nasal cannula oxygen Cardiovascular status: blood pressure returned to baseline and stable Postop Assessment: no apparent nausea or vomiting Anesthetic complications: no   No notable events documented.   Last Vitals:  Vitals:   04/09/24 1717 04/09/24 1739  BP: 101/76 103/88  Pulse: 77 80  Resp: 19 18  Temp: 36.6 C (!) 36.1 C  SpO2: 99% 92%    Last Pain:  Vitals:   04/09/24 1739  TempSrc: Temporal  PainSc: 0-No pain                 Zula Hitch

## 2024-05-01 DIAGNOSIS — S42201D Unspecified fracture of upper end of right humerus, subsequent encounter for fracture with routine healing: Secondary | ICD-10-CM | POA: Diagnosis not present

## 2024-05-01 LAB — AEROBIC/ANAEROBIC CULTURE W GRAM STAIN (SURGICAL/DEEP WOUND)
Culture: NO GROWTH
Culture: NO GROWTH
Culture: NO GROWTH
Culture: NO GROWTH
Gram Stain: NONE SEEN
Gram Stain: NONE SEEN

## 2024-05-04 ENCOUNTER — Other Ambulatory Visit: Payer: Self-pay | Admitting: Internal Medicine

## 2024-05-07 ENCOUNTER — Encounter (INDEPENDENT_AMBULATORY_CARE_PROVIDER_SITE_OTHER): Payer: Self-pay

## 2024-05-07 DIAGNOSIS — M25611 Stiffness of right shoulder, not elsewhere classified: Secondary | ICD-10-CM | POA: Diagnosis not present

## 2024-05-07 DIAGNOSIS — G8929 Other chronic pain: Secondary | ICD-10-CM | POA: Diagnosis not present

## 2024-05-07 DIAGNOSIS — M6281 Muscle weakness (generalized): Secondary | ICD-10-CM | POA: Diagnosis not present

## 2024-05-07 DIAGNOSIS — M25511 Pain in right shoulder: Secondary | ICD-10-CM | POA: Diagnosis not present

## 2024-05-07 DIAGNOSIS — Z96611 Presence of right artificial shoulder joint: Secondary | ICD-10-CM | POA: Diagnosis not present

## 2024-05-08 DIAGNOSIS — M25511 Pain in right shoulder: Secondary | ICD-10-CM | POA: Diagnosis not present

## 2024-05-08 DIAGNOSIS — M6281 Muscle weakness (generalized): Secondary | ICD-10-CM | POA: Diagnosis not present

## 2024-05-08 DIAGNOSIS — Z96611 Presence of right artificial shoulder joint: Secondary | ICD-10-CM | POA: Diagnosis not present

## 2024-05-08 DIAGNOSIS — G8929 Other chronic pain: Secondary | ICD-10-CM | POA: Diagnosis not present

## 2024-05-08 DIAGNOSIS — M25611 Stiffness of right shoulder, not elsewhere classified: Secondary | ICD-10-CM | POA: Diagnosis not present

## 2024-06-03 ENCOUNTER — Other Ambulatory Visit: Payer: Self-pay | Admitting: Internal Medicine

## 2024-06-03 DIAGNOSIS — I779 Disorder of arteries and arterioles, unspecified: Secondary | ICD-10-CM

## 2024-06-04 ENCOUNTER — Other Ambulatory Visit: Payer: Self-pay | Admitting: Internal Medicine

## 2024-06-04 DIAGNOSIS — F3289 Other specified depressive episodes: Secondary | ICD-10-CM

## 2024-06-11 ENCOUNTER — Other Ambulatory Visit: Payer: Self-pay

## 2024-07-01 ENCOUNTER — Other Ambulatory Visit: Payer: Self-pay | Admitting: Internal Medicine

## 2024-07-01 DIAGNOSIS — F3289 Other specified depressive episodes: Secondary | ICD-10-CM

## 2024-07-03 ENCOUNTER — Other Ambulatory Visit: Payer: Self-pay | Admitting: Internal Medicine

## 2024-07-03 DIAGNOSIS — S42201S Unspecified fracture of upper end of right humerus, sequela: Secondary | ICD-10-CM | POA: Diagnosis not present

## 2024-07-03 DIAGNOSIS — Z96611 Presence of right artificial shoulder joint: Secondary | ICD-10-CM | POA: Diagnosis not present

## 2024-07-03 DIAGNOSIS — M25511 Pain in right shoulder: Secondary | ICD-10-CM | POA: Diagnosis not present

## 2024-07-03 DIAGNOSIS — M6281 Muscle weakness (generalized): Secondary | ICD-10-CM | POA: Diagnosis not present

## 2024-07-03 DIAGNOSIS — M25611 Stiffness of right shoulder, not elsewhere classified: Secondary | ICD-10-CM | POA: Diagnosis not present

## 2024-07-03 DIAGNOSIS — G8929 Other chronic pain: Secondary | ICD-10-CM | POA: Diagnosis not present

## 2024-07-04 ENCOUNTER — Telehealth: Payer: Self-pay

## 2024-07-04 MED ORDER — ROSUVASTATIN CALCIUM 40 MG PO TABS: 40.0000 mg | ORAL_TABLET | Freq: Every day | ORAL | 0 refills | Status: AC

## 2024-07-04 NOTE — Progress Notes (Signed)
   07/04/2024  Patient ID: Rachel Hahn [place], female   DOB: [birth year], 68 y.o.   MRN: [id number]  Pharmacy Quality Measure Review  This patient is appearing on a report for being at risk of failing the adherence measure for cholesterol (statin) medications this calendar year.   Medication: Rosuvastatin  40mg  Last fill date: 03/04/24 for 60 day supply  Will collaborate with provider to facilitate refill needs. Refill for 60 days approved while patient waits to establish care with new PCP.   Jon VEAR [place]au, PharmD Clinical Pharmacist [phone]

## 2024-07-05 DIAGNOSIS — Z96611 Presence of right artificial shoulder joint: Secondary | ICD-10-CM | POA: Diagnosis not present

## 2024-07-05 DIAGNOSIS — G8929 Other chronic pain: Secondary | ICD-10-CM | POA: Diagnosis not present

## 2024-07-05 DIAGNOSIS — S42201S Unspecified fracture of upper end of right humerus, sequela: Secondary | ICD-10-CM | POA: Diagnosis not present

## 2024-07-05 DIAGNOSIS — M6281 Muscle weakness (generalized): Secondary | ICD-10-CM | POA: Diagnosis not present

## 2024-07-05 DIAGNOSIS — M25511 Pain in right shoulder: Secondary | ICD-10-CM | POA: Diagnosis not present

## 2024-07-05 DIAGNOSIS — M25611 Stiffness of right shoulder, not elsewhere classified: Secondary | ICD-10-CM | POA: Diagnosis not present

## 2024-07-11 DIAGNOSIS — M25511 Pain in right shoulder: Secondary | ICD-10-CM | POA: Diagnosis not present

## 2024-07-11 DIAGNOSIS — G8929 Other chronic pain: Secondary | ICD-10-CM | POA: Diagnosis not present

## 2024-07-11 DIAGNOSIS — M25611 Stiffness of right shoulder, not elsewhere classified: Secondary | ICD-10-CM | POA: Diagnosis not present

## 2024-07-11 DIAGNOSIS — Z96611 Presence of right artificial shoulder joint: Secondary | ICD-10-CM | POA: Diagnosis not present

## 2024-07-11 DIAGNOSIS — M6281 Muscle weakness (generalized): Secondary | ICD-10-CM | POA: Diagnosis not present

## 2024-07-11 DIAGNOSIS — S42201S Unspecified fracture of upper end of right humerus, sequela: Secondary | ICD-10-CM | POA: Diagnosis not present

## 2024-07-17 DIAGNOSIS — M25611 Stiffness of right shoulder, not elsewhere classified: Secondary | ICD-10-CM | POA: Diagnosis not present

## 2024-07-17 DIAGNOSIS — G8929 Other chronic pain: Secondary | ICD-10-CM | POA: Diagnosis not present

## 2024-07-17 DIAGNOSIS — Z96611 Presence of right artificial shoulder joint: Secondary | ICD-10-CM | POA: Diagnosis not present

## 2024-07-17 DIAGNOSIS — M6281 Muscle weakness (generalized): Secondary | ICD-10-CM | POA: Diagnosis not present

## 2024-07-17 DIAGNOSIS — S42201S Unspecified fracture of upper end of right humerus, sequela: Secondary | ICD-10-CM | POA: Diagnosis not present

## 2024-07-17 DIAGNOSIS — M25511 Pain in right shoulder: Secondary | ICD-10-CM | POA: Diagnosis not present

## 2024-07-19 DIAGNOSIS — M25611 Stiffness of right shoulder, not elsewhere classified: Secondary | ICD-10-CM | POA: Diagnosis not present

## 2024-07-19 DIAGNOSIS — Z96611 Presence of right artificial shoulder joint: Secondary | ICD-10-CM | POA: Diagnosis not present

## 2024-07-19 DIAGNOSIS — M6281 Muscle weakness (generalized): Secondary | ICD-10-CM | POA: Diagnosis not present

## 2024-07-19 DIAGNOSIS — G8929 Other chronic pain: Secondary | ICD-10-CM | POA: Diagnosis not present

## 2024-07-19 DIAGNOSIS — S42201S Unspecified fracture of upper end of right humerus, sequela: Secondary | ICD-10-CM | POA: Diagnosis not present

## 2024-07-19 DIAGNOSIS — M25511 Pain in right shoulder: Secondary | ICD-10-CM | POA: Diagnosis not present

## 2024-07-26 DIAGNOSIS — M25611 Stiffness of right shoulder, not elsewhere classified: Secondary | ICD-10-CM | POA: Diagnosis not present

## 2024-07-26 DIAGNOSIS — Z96611 Presence of right artificial shoulder joint: Secondary | ICD-10-CM | POA: Diagnosis not present

## 2024-07-26 DIAGNOSIS — M25511 Pain in right shoulder: Secondary | ICD-10-CM | POA: Diagnosis not present

## 2024-07-26 DIAGNOSIS — G8929 Other chronic pain: Secondary | ICD-10-CM | POA: Diagnosis not present

## 2024-07-26 DIAGNOSIS — S42201S Unspecified fracture of upper end of right humerus, sequela: Secondary | ICD-10-CM | POA: Diagnosis not present

## 2024-07-30 ENCOUNTER — Other Ambulatory Visit: Payer: Self-pay | Admitting: Internal Medicine

## 2024-07-30 DIAGNOSIS — I779 Disorder of arteries and arterioles, unspecified: Secondary | ICD-10-CM

## 2024-07-31 DIAGNOSIS — M25511 Pain in right shoulder: Secondary | ICD-10-CM | POA: Diagnosis not present

## 2024-07-31 DIAGNOSIS — M25611 Stiffness of right shoulder, not elsewhere classified: Secondary | ICD-10-CM | POA: Diagnosis not present

## 2024-07-31 DIAGNOSIS — M6281 Muscle weakness (generalized): Secondary | ICD-10-CM | POA: Diagnosis not present

## 2024-07-31 DIAGNOSIS — G8929 Other chronic pain: Secondary | ICD-10-CM | POA: Diagnosis not present

## 2024-07-31 DIAGNOSIS — Z96611 Presence of right artificial shoulder joint: Secondary | ICD-10-CM | POA: Diagnosis not present

## 2024-07-31 DIAGNOSIS — S42201S Unspecified fracture of upper end of right humerus, sequela: Secondary | ICD-10-CM | POA: Diagnosis not present

## 2024-08-02 DIAGNOSIS — M25611 Stiffness of right shoulder, not elsewhere classified: Secondary | ICD-10-CM | POA: Diagnosis not present

## 2024-08-02 DIAGNOSIS — M6281 Muscle weakness (generalized): Secondary | ICD-10-CM | POA: Diagnosis not present

## 2024-08-02 DIAGNOSIS — S42201S Unspecified fracture of upper end of right humerus, sequela: Secondary | ICD-10-CM | POA: Diagnosis not present

## 2024-08-02 DIAGNOSIS — M25511 Pain in right shoulder: Secondary | ICD-10-CM | POA: Diagnosis not present

## 2024-08-02 DIAGNOSIS — Z96611 Presence of right artificial shoulder joint: Secondary | ICD-10-CM | POA: Diagnosis not present

## 2024-08-02 DIAGNOSIS — G8929 Other chronic pain: Secondary | ICD-10-CM | POA: Diagnosis not present

## 2024-08-09 DIAGNOSIS — Z96611 Presence of right artificial shoulder joint: Secondary | ICD-10-CM | POA: Diagnosis not present

## 2024-08-09 DIAGNOSIS — M25511 Pain in right shoulder: Secondary | ICD-10-CM | POA: Diagnosis not present

## 2024-08-09 DIAGNOSIS — S42201S Unspecified fracture of upper end of right humerus, sequela: Secondary | ICD-10-CM | POA: Diagnosis not present

## 2024-08-09 DIAGNOSIS — M6281 Muscle weakness (generalized): Secondary | ICD-10-CM | POA: Diagnosis not present

## 2024-08-09 DIAGNOSIS — G8929 Other chronic pain: Secondary | ICD-10-CM | POA: Diagnosis not present

## 2024-08-09 DIAGNOSIS — M25611 Stiffness of right shoulder, not elsewhere classified: Secondary | ICD-10-CM | POA: Diagnosis not present

## 2024-08-22 ENCOUNTER — Ambulatory Visit: Admitting: Nurse Practitioner

## 2024-08-22 ENCOUNTER — Encounter: Payer: Self-pay | Admitting: Nurse Practitioner

## 2024-08-22 VITALS — BP 116/74 | HR 102 | Temp 97.7°F | Ht 61.0 in | Wt 167.4 lb

## 2024-08-22 DIAGNOSIS — N1832 Chronic kidney disease, stage 3b: Secondary | ICD-10-CM

## 2024-08-22 DIAGNOSIS — F32A Depression, unspecified: Secondary | ICD-10-CM

## 2024-08-22 DIAGNOSIS — J449 Chronic obstructive pulmonary disease, unspecified: Secondary | ICD-10-CM

## 2024-08-22 DIAGNOSIS — K219 Gastro-esophageal reflux disease without esophagitis: Secondary | ICD-10-CM

## 2024-08-22 DIAGNOSIS — E782 Mixed hyperlipidemia: Secondary | ICD-10-CM

## 2024-08-22 DIAGNOSIS — I10 Essential (primary) hypertension: Secondary | ICD-10-CM

## 2024-08-22 DIAGNOSIS — I739 Peripheral vascular disease, unspecified: Secondary | ICD-10-CM | POA: Diagnosis not present

## 2024-08-22 LAB — LIPID PANEL
Cholesterol: 147 mg/dL (ref 0–200)
HDL: 45 mg/dL (ref >39.00)
LDL Cholesterol: 56 mg/dL (ref 0–99)
NonHDL: 101.93
Total CHOL/HDL Ratio: 3
Triglycerides: 232 mg/dL — ABNORMAL HIGH (ref 0.0–149.0)
VLDL: 46.4 mg/dL — ABNORMAL HIGH (ref 0.0–40.0)

## 2024-08-22 LAB — CBC WITH DIFFERENTIAL/PLATELET
Basophils Absolute: 0 K/uL (ref 0.0–0.1)
Basophils Relative: 0.6 % (ref 0.0–3.0)
Eosinophils Absolute: 0.2 K/uL (ref 0.0–0.7)
Eosinophils Relative: 2.5 % (ref 0.0–5.0)
HCT: 41.8 % (ref 36.0–46.0)
Hemoglobin: 13.3 g/dL (ref 12.0–15.0)
Lymphocytes Relative: 25.6 % (ref 12.0–46.0)
Lymphs Abs: 2.1 K/uL (ref 0.7–4.0)
MCHC: 31.8 g/dL (ref 30.0–36.0)
MCV: 83.3 fl (ref 78.0–100.0)
Monocytes Absolute: 0.5 K/uL (ref 0.1–1.0)
Monocytes Relative: 6.3 % (ref 3.0–12.0)
Neutro Abs: 5.2 K/uL (ref 1.4–7.7)
Neutrophils Relative %: 65 % (ref 43.0–77.0)
Platelets: 262 K/uL (ref 150.0–400.0)
RBC: 5.02 Mil/uL (ref 3.87–5.11)
RDW: 15.7 % — ABNORMAL HIGH (ref 11.5–15.5)
WBC: 8.1 K/uL (ref 4.0–10.5)

## 2024-08-22 LAB — COMPREHENSIVE METABOLIC PANEL WITH GFR
ALT: 9 U/L (ref 0–35)
AST: 13 U/L (ref 0–37)
Albumin: 4.1 g/dL (ref 3.5–5.2)
Alkaline Phosphatase: 109 U/L (ref 39–117)
BUN: 17 mg/dL (ref 6–23)
CO2: 27 meq/L (ref 19–32)
Calcium: 8.9 mg/dL (ref 8.4–10.5)
Chloride: 104 meq/L (ref 96–112)
Creatinine, Ser: 1.49 mg/dL — ABNORMAL HIGH (ref 0.40–1.20)
GFR: 36.25 mL/min — ABNORMAL LOW (ref >60.00)
Glucose, Bld: 134 mg/dL — ABNORMAL HIGH (ref 70–99)
Potassium: 3.7 meq/L (ref 3.5–5.1)
Sodium: 140 meq/L (ref 135–145)
Total Bilirubin: 0.3 mg/dL (ref 0.2–1.2)
Total Protein: 6.9 g/dL (ref 6.0–8.3)

## 2024-08-22 LAB — HEMOGLOBIN A1C: Hgb A1c MFr Bld: 7 % — ABNORMAL HIGH (ref 4.6–6.5)

## 2024-08-22 LAB — MICROALBUMIN / CREATININE URINE RATIO
Creatinine,U: 103.8 mg/dL
Microalb Creat Ratio: 286.2 mg/g — ABNORMAL HIGH (ref 0.0–30.0)
Microalb, Ur: 29.7 mg/dL — ABNORMAL HIGH (ref 0.0–1.9)

## 2024-08-22 LAB — TSH: TSH: 1.14 u[IU]/mL (ref 0.35–5.50)

## 2024-08-22 NOTE — Progress Notes (Signed)
 Established Patient Office Visit  Subjective:  Patient ID: Rachel Hahn, female    DOB: 03-Apr-1956  Age: 68 y.o. MRN: [id number]  CC:  Chief Complaint  Patient presents with   Establish Care   Discussed the use of a AI scribe software for clinical note transcription with the patient, who gave verbal consent to proceed.  HPI  Rachel Hahn is a 68 year old female who presents to establish care.  She has hypertension managed with atenolol , with consistently good blood pressure readings at home and a current reading of 116/74 mmHg. Atenolol  was initially prescribed for mitral valve prolapse. Her hyperlipidemia is managed with Crestor  40 mg, with no muscle pain or spasms reported.  GERD is managed with pantoprazole  twice daily. She avoids spicy foods and has quit smoking. Missing doses leads to significant discomfort.  She has COPD and was previously on albuterol , which was changed to Breztri. She has been unable to fill the prescription and needs it.  Chronic pain persists following two surgeries on her arm due to a fracture. She takes cyclobenzaprine for pain management and is undergoing physical therapy.  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 She drinks more water  and occasionally consumes diet sodas. She is allergic to NSAIDs.   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    Past Surgical History:  Procedure Laterality Date   AORTIC INTERVENTION N/A 06/08/2022   Procedure: AORTIC INTERVENTION;  Surgeon: Marea Selinda RAMAN, MD;  Location: [facility];  Service: Cardiovascular;  Laterality: N/A;   CYSTOSCOPY WITH HOLMIUM LASER LITHOTRIPSY Bilateral    ELBOW ARTHROPLASTY Right 12/29/2021   radial tunnel release   KIDNEY CYST REMOVAL     LITHOTRIPSY     REVERSE SHOULDER ARTHROPLASTY Right 10/03/2023   Procedure: Right Reverse Total shoulder arthroplasty, biceps tenodesis;  Surgeon: Tobie Priest, MD;  Location: [facility];  Service: Orthopedics;  Laterality: Right;   STRABISMUS SURGERY Bilateral    TONSILLECTOMY     TOTAL SHOULDER REVISION Right 04/09/2024   Procedure: Right Shoulder greater tuberosity excision;  Surgeon: Tobie Priest, MD;  Location: [facility];  Service: Orthopedics;  Laterality: Right;  Right shoulder bony excision of greater tuberosity with possible revision shoulder arthroplasty    Family History  Problem Relation Age of Onset   Hypertension Mother    COPD Father    Diabetes Father    Hypertension Father    Hypertension Other     Social History   Socioeconomic History   Marital status: Single    Spouse name: Not on file   Number of children: 0   Years of education: Not on file   Highest education level: Not  on file  Occupational History   Not on file  Tobacco Use   Smoking status: Former    Current packs/day: 1.00    Average packs/day: 1 pack/day for 20.0 years (20.0 ttl pk-yrs)    Types: Cigarettes   Smokeless tobacco: Never  Vaping Use   Vaping status: Never Used  Substance and Sexual Activity   Alcohol use: No   Drug use: No   Sexual activity: Not Currently  Other Topics Concern   Not on file  Social History Narrative   Lives with friend   Social Drivers of Health   Financial Resource Strain: Low Risk  (09/05/2024)    Received from [facility]   Overall Financial Resource Strain (CARDIA)    Difficulty of Paying Living Expenses: Not very hard  Food Insecurity: No Food Insecurity (09/05/2024)   Received from [facility]   Hunger Vital Sign    Within the past 12 months, you worried that your food would run out before you got the money to buy more.: Never true    Within the past 12 months, the food you bought just didn't last and you didn't have money to get more.: Never true  Transportation Needs: No Transportation Needs (09/05/2024)   Received from [facility] - Transportation    In the past 12 months, has lack of transportation kept you from medical appointments or from getting medications?: No    Lack of Transportation (Non-Medical): No  Physical Activity: Not on file  Stress: Not on file  Social Connections: Not on file  Intimate Partner Violence: Not on file     Outpatient Medications Prior to Visit  Medication Sig Dispense Refill   acetaminophen  (TYLENOL ) 500 MG tablet Take 2 tablets (1,000 mg total) by mouth every 8 (eight) hours. 90 tablet 2   albuterol  (PROVENTIL ) (2.5 MG/3ML) 0.083% nebulizer solution Take 2.5 mg by nebulization every 6 (six) hours as needed for wheezing or shortness of breath.     atenolol  (TENORMIN ) 100 MG tablet Take 1 tablet by mouth once daily 30 tablet 1   budeson-glycopyrrolate -formoterol  (BREZTRI AEROSPHERE) 160-9-4.8 MCG/ACT AERO inhaler Inhale 2 puffs into the lungs 2 (two) times daily.     clopidogrel  (PLAVIX ) 75 MG tablet Take 1 tablet by mouth once daily 30 tablet 1   cyclobenzaprine (FLEXERIL) 10 MG tablet Take 10 mg by mouth 3 (three) times daily.     ondansetron  (ZOFRAN ) 4 MG tablet Take 1 tablet (4 mg total) by mouth every 6 (six) hours as needed for nausea. 20 tablet 0   ondansetron  (ZOFRAN -ODT) 4 MG disintegrating tablet Take 1 tablet (4 mg total) by mouth every 8 (eight) hours as needed for  nausea or vomiting. 20 tablet 0   oxyCODONE  (ROXICODONE ) 5 MG immediate release tablet Take 1-2 tablets (5-10 mg total) by mouth every 4 (four) hours as needed (pain). 30 tablet 0   pantoprazole  (PROTONIX ) 40 MG tablet Take 1 tablet by mouth twice daily 180 tablet 0   rosuvastatin  (CRESTOR ) 40 MG tablet Take 1 tablet by mouth once daily 60 tablet 0   rosuvastatin  (CRESTOR ) 40 MG tablet Take 1 tablet (40 mg total) by mouth daily. 60 tablet 0   sertraline  (ZOLOFT ) 100 MG tablet Take 1 tablet by mouth once daily 30 tablet 0   No facility-administered medications prior to visit.    Allergies  Allergen Reactions   Ibuprofen Shortness Of Breath    Shortness of breath/chest pain  Nitrofurantoin Hives   Nsaids Shortness Of Breath   Prednisone Other (See Comments)    Nervous,jittery, she can tolerate Symbicort.   Codeine Nausea And Vomiting   Morphine  And Codeine Nausea And Vomiting   Sudafed [Pseudoephedrine Hcl] Other (See Comments)    Chest pain/shortness of breath   Sulfa Antibiotics Nausea Only    ROS Review of Systems Negative unless indicated in HPI.    Objective:    Physical Exam Constitutional:      Appearance: Normal appearance.  HENT:     Right Ear: Tympanic membrane normal.     Left Ear: Tympanic membrane normal.     Mouth/Throat:     Mouth: Mucous membranes are moist.  Eyes:     Conjunctiva/sclera: Conjunctivae normal.     Pupils: Pupils are equal, round, and reactive to light.  Cardiovascular:     Rate and Rhythm: Normal rate and regular rhythm.     Pulses: Normal pulses.     Heart sounds: Normal heart sounds.  Pulmonary:     Effort: Pulmonary effort is normal.     Breath sounds: Normal breath sounds.  Abdominal:     General: Bowel sounds are normal.     Palpations: Abdomen is soft.  Musculoskeletal:     Cervical back: Normal range of motion. No tenderness.  Skin:    General: Skin is warm.     Findings: No bruising.  Neurological:     General: No focal  deficit present.     Mental Status: She is alert and oriented to person, place, and time. Mental status is at baseline.  Psychiatric:        Mood and Affect: Mood normal.        Behavior: Behavior normal.        Thought Content: Thought content normal.        Judgment: Judgment normal.     BP 116/74   Pulse (!) 102   Temp 97.7 F (36.5 C)   Ht 5' 1 (1.549 m)   Wt 167 lb 6.4 oz (75.9 kg)   SpO2 92%   BMI 31.63 kg/m  Wt Readings from Last 3 Encounters:  08/29/24 167 lb 9.6 oz (76 kg)  08/22/24 167 lb 6.4 oz (75.9 kg)  04/09/24 160 lb 0.9 oz (72.6 kg)     Health Maintenance  Topic Date Due   FOOT EXAM  Never done   OPHTHALMOLOGY EXAM  Never done   COVID-19 Vaccine (3 - Pfizer risk series) 06/24/2020   Medicare Annual Wellness (AWV)  09/21/2024 (Originally 09/24/2023)   Zoster Vaccines- Shingrix (1 of 2) 11/21/2024 (Originally 08/11/1976)   Mammogram  02/19/2025 (Originally 08/11/1997)   Influenza Vaccine  03/18/2025 (Originally 07/19/2024)   Lung Cancer Screening  08/22/2025 (Originally 05/17/2018)   DTaP/Tdap/Td (1 - Tdap) 08/22/2025 (Originally 08/11/1976)   Pneumococcal Vaccine: 50+ Years (1 of 2 - PCV) 08/22/2025 (Originally 08/11/1976)   Colonoscopy  08/22/2025 (Originally 08/11/2002)   Hepatitis C Screening  08/22/2025 (Originally 08/12/1975)   HEMOGLOBIN A1C  02/19/2025   Diabetic kidney evaluation - eGFR measurement  08/22/2025   Diabetic kidney evaluation - Urine ACR  08/22/2025   DEXA SCAN  Completed   HPV VACCINES  Aged Out   Meningococcal B Vaccine  Aged Out    There are no preventive care reminders to display for this patient.  Lab Results  Component Value Date   TSH 1.14 08/22/2024   Lab Results  Component Value Date   WBC 8.1 08/22/2024  HGB 13.3 08/22/2024   HCT 41.8 08/22/2024   MCV 83.3 08/22/2024   PLT 262.0 08/22/2024   Lab Results  Component Value Date   NA 140 08/22/2024   K 3.7 08/22/2024   CO2 27 08/22/2024   GLUCOSE 134 (H) 08/22/2024    BUN 17 08/22/2024   CREATININE 1.49 (H) 08/22/2024   BILITOT 0.3 08/22/2024   ALKPHOS 109 08/22/2024   AST 13 08/22/2024   ALT 9 08/22/2024   PROT 6.9 08/22/2024   ALBUMIN  4.1 08/22/2024   CALCIUM  8.9 08/22/2024   ANIONGAP 11 04/09/2024   GFR 36.25 (L) 08/22/2024   Lab Results  Component Value Date   CHOL 147 08/22/2024   Lab Results  Component Value Date   HDL 45.00 08/22/2024   Lab Results  Component Value Date   LDLCALC 56 08/22/2024   Lab Results  Component Value Date   TRIG 232.0 (H) 08/22/2024   Lab Results  Component Value Date   CHOLHDL 3 08/22/2024   Lab Results  Component Value Date   HGBA1C 7.0 (H) 08/22/2024      Assessment & Plan:  Mixed hyperlipidemia Assessment & Plan: Hyperlipidemia managed with Crestor  40 mg daily. No side effects reported. No recent cholesterol labs available. - Continue Crestor  40 mg daily. - Obtain recent cholesterol labs.  Orders: -     Lipid panel  Gastroesophageal reflux disease, unspecified whether esophagitis present Assessment & Plan: GERD managed with pantoprazole . Avoids spicy foods and quit smoking, aiding symptom management. - Continue pantoprazole  twice daily.  Orders: -     Hemoglobin A1c -     TSH  Essential hypertension, benign Assessment & Plan: Blood pressure well-controlled on atenolol . Discussed potential switch to kidney-protective antihypertensive if needed. - Monitor blood pressure. - Re-evaluate antihypertensive medication based on kidney function results.  Orders: -     CBC with Differential/Platelet -     Comprehensive metabolic panel with GFR -     Microalbumin / creatinine urine ratio  Stage 3b chronic kidney disease (HCC) Assessment & Plan: Chronic kidney disease stage 3 with GFR 39. Emphasized hydration and avoiding nephrotoxic medications, including NSAIDs. - Recheck kidney function tests. - Advise increased fluid intake. - Avoid NSAIDs and nephrotoxic  medications.   Depression, unspecified depression type Assessment & Plan: Stable at present. -Continue Zoloft  daily.    PAD (peripheral artery disease) Assessment & Plan: Peripheral arterial disease with stent placement. On Plavix , reports easy bruising. No recent cardiology or vascular surgery follow-up since 2023. - Continue Plavix . - Refer to vascular surgery for follow-up.   Chronic obstructive pulmonary disease, unspecified COPD type (HCC) Assessment & Plan: COPD previously managed with Breztri inhaler. -No reported SOB.      Follow-up: Return in about 4 months (around 12/22/2024).   Rorie Delmore, NP

## 2024-08-23 DIAGNOSIS — M25611 Stiffness of right shoulder, not elsewhere classified: Secondary | ICD-10-CM | POA: Diagnosis not present

## 2024-08-23 DIAGNOSIS — G8929 Other chronic pain: Secondary | ICD-10-CM | POA: Diagnosis not present

## 2024-08-23 DIAGNOSIS — M25511 Pain in right shoulder: Secondary | ICD-10-CM | POA: Diagnosis not present

## 2024-08-23 DIAGNOSIS — S42201S Unspecified fracture of upper end of right humerus, sequela: Secondary | ICD-10-CM | POA: Diagnosis not present

## 2024-08-23 DIAGNOSIS — M6281 Muscle weakness (generalized): Secondary | ICD-10-CM | POA: Diagnosis not present

## 2024-08-23 DIAGNOSIS — Z96611 Presence of right artificial shoulder joint: Secondary | ICD-10-CM | POA: Diagnosis not present

## 2024-08-26 ENCOUNTER — Ambulatory Visit: Payer: Self-pay | Admitting: Nurse Practitioner

## 2024-08-26 NOTE — Progress Notes (Signed)
 Please schedule OV to discuss lab result, this week.

## 2024-08-28 DIAGNOSIS — M25611 Stiffness of right shoulder, not elsewhere classified: Secondary | ICD-10-CM | POA: Diagnosis not present

## 2024-08-28 DIAGNOSIS — Z96611 Presence of right artificial shoulder joint: Secondary | ICD-10-CM | POA: Diagnosis not present

## 2024-08-28 DIAGNOSIS — S42201S Unspecified fracture of upper end of right humerus, sequela: Secondary | ICD-10-CM | POA: Diagnosis not present

## 2024-08-28 DIAGNOSIS — M6281 Muscle weakness (generalized): Secondary | ICD-10-CM | POA: Diagnosis not present

## 2024-08-28 DIAGNOSIS — M25511 Pain in right shoulder: Secondary | ICD-10-CM | POA: Diagnosis not present

## 2024-08-28 DIAGNOSIS — G8929 Other chronic pain: Secondary | ICD-10-CM | POA: Diagnosis not present

## 2024-08-29 ENCOUNTER — Ambulatory Visit: Admitting: Nurse Practitioner

## 2024-08-29 ENCOUNTER — Other Ambulatory Visit: Payer: Self-pay | Admitting: Internal Medicine

## 2024-08-29 ENCOUNTER — Encounter: Payer: Self-pay | Admitting: Nurse Practitioner

## 2024-08-29 VITALS — BP 122/80 | HR 91 | Temp 98.7°F | Ht 61.0 in | Wt 167.6 lb

## 2024-08-29 DIAGNOSIS — R809 Proteinuria, unspecified: Secondary | ICD-10-CM

## 2024-08-29 DIAGNOSIS — N1832 Chronic kidney disease, stage 3b: Secondary | ICD-10-CM | POA: Diagnosis not present

## 2024-08-29 DIAGNOSIS — E119 Type 2 diabetes mellitus without complications: Secondary | ICD-10-CM | POA: Diagnosis not present

## 2024-08-29 MED ORDER — EMPAGLIFLOZIN 10 MG PO TABS: 10.0000 mg | ORAL_TABLET | Freq: Every morning | ORAL | 1 refills | Status: AC

## 2024-08-29 NOTE — Patient Instructions (Signed)
 CHRONIC KIDNEY DISEASE STAGE 3: Your kidney function has worsened, as indicated by a decrease in your GFR and the presence of protein in your urine. -You will be referred to a nephrologist for further evaluation and management. -Increase your fluid intake. -Make dietary changes to include more fruits and vegetables and reduce processed foods. -Avoid NSAIDs and medications that can harm your kidneys. -Start taking Jardiance  to help protect your kidneys and manage your blood sugar.  Empagliflozin  Tablets What is this medication? EMPAGLIFLOZIN  (EM pa gli FLOE zin) treats type 2 diabetes. It works by helping your kidneys remove sugar (glucose) from your blood through the urine, which decreases your blood sugar. It may also be used to lower the risk of worsening disease and death caused by kidney disease and heart failure. It works by helping your kidneys remove salt (sodium) from your blood through the urine. This decreases the amount of work the kidneys and heart have to do. Changes to diet and exercise are often combined with this medication. This medicine may be used for other purposes; ask your health care provider or pharmacist if you have questions. COMMON BRAND NAME(S): Jardiance  What should I tell my care team before I take this medication? They need to know if you have any of these conditions: Change in diet, eating less Changes to your insulin dose Dehydration Diet low in salt Frequently drink alcohol Having surgery History of amputation History of diabetic ketoacidosis (DKA) History of foot sores caused by diabetes History of genital infections History of pancreatitis or pancreas problems History of urinary tract infections (UTI) Kidney disease Liver disease Nerve condition that causes pain, tingling, or numbness in the hands or feet Peripheral vascular disease, narrowing of the blood vessels Serious infection Trouble passing urine Type 1 diabetes An unusual or allergic  reaction to empagliflozin , other medications, foods, dyes, or preservatives Pregnant or trying to get pregnant Breastfeeding How should I use this medication? Take this medication by mouth with water . Take it as directed on the prescription label at the same time every day. You may take it with or without food. Keep taking it unless your care team tells you to stop. A special MedGuide will be given to you by the pharmacist with each prescription and refill. Be sure to read this information carefully each time. Talk to your care team about the use of this medication in children. While it may be prescribed for children as young as 10 years for selected conditions, precautions do apply. Overdosage: If you think you have taken too much of this medicine contact a poison control center or emergency room at once. NOTE: This medicine is only for you. Do not share this medicine with others. What if I miss a dose? If you miss a dose, take it as soon as you can. If it is almost time for your next dose, take only that dose. Do not take double or extra doses. What may interact with this medication? Alcohol Diuretics Insulin Lithium This list may not describe all possible interactions. Give your health care provider a list of all the medicines, herbs, non-prescription drugs, or dietary supplements you use. Also tell them if you smoke, drink alcohol, or use illegal drugs. Some items may interact with your medicine. What should I watch for while using this medication? Visit your care team for regular checks on your progress. Tell your care team if your symptoms do not start to get better or if they get worse. You may need blood work done  while you are taking this medication. If you have diabetes, your care team will monitor your HbA1C (A1C). This test shows what your average blood sugar (glucose) level was over the past 2 to 3 months. This medication may increase the risk of diabetic ketoacidosis (DKA), a  serious condition in which high ketone levels make your blood too acidic. Your care team may ask you to check for ketones in your urine or blood while you are taking this medication. If you develop nausea, vomiting, stomach pain, unusual weakness or fatigue, or trouble breathing, stop taking this medication and call your care team right away. Check with your care team if you have severe diarrhea, nausea, and vomiting, or if you sweat a lot. The loss of too much body fluid may make it dangerous for you to take this medication. Know the symptoms of low blood sugar and know how to treat it. Always carry a source of quick sugar with you. Examples include hard sugar candy or glucose tablets. Make sure others know that you can choke if you eat or drink if your blood sugar is too low and you are unable to care for yourself. Get medical help at once. Tell your care team if you have high blood sugar. Your medication dose may change if your body is under stress. Some types of stress that may affect your blood sugar include fever, infection, and surgery. If you have diabetes, you may get a false-positive result for sugar in your urine while you are taking this medication. Check with your care team. What side effects may I notice from receiving this medication? Side effects that you should report to your care team as soon as possible: Allergic reactions--skin rash, itching, hives, swelling of the face, lips, tongue, or throat Dehydration--increased thirst, dry mouth, feeling faint or lightheaded, headache, dark yellow or brown urine Diabetic ketoacidosis (DKA)--increased thirst or amount of urine, dry mouth, fatigue, fruity odor to breath, trouble breathing, stomach pain, nausea, vomiting Genital yeast infection--redness, swelling, pain, or itchiness, odor, thick or lumpy discharge New pain or tenderness, change in skin color, sores or ulcers, infection of the leg or foot Infection or redness, swelling, tenderness,  or pain in the genitals, or area from the genitals to the back of the rectum Urinary tract infection (UTI)--burning when passing urine, passing frequent small amounts of urine, bloody or cloudy urine, pain in the lower back or sides This list may not describe all possible side effects. Call your doctor for medical advice about side effects. You may report side effects to FDA at 1-800-FDA-1088. Where should I keep my medication? Keep out of the reach of children and pets. Store at room temperature between 20 and 25 degrees C (68 and 77 degrees F). Get rid of any unused medication after the expiration date. To get rid of medications that are no longer needed or have expired: Take the medication to a medication take-back program. Check with your pharmacy or law enforcement to find a location. If you cannot return the medication, check the label or package insert to see if the medication should be thrown out in the garbage or flushed down the toilet. If you are not sure, ask your care team. If it is safe to put it in the trash, take the medication out of the container. Mix the medication with cat litter, dirt, coffee grounds, or other unwanted substance. Seal the mixture in a bag or container. Put it in the trash. NOTE: This sheet is a summary.  It may not cover all possible information. If you have questions about this medicine, talk to your doctor, pharmacist, or health care provider.  2024 Elsevier/Gold Standard (2023-11-17 00:00:00)

## 2024-08-29 NOTE — Progress Notes (Signed)
 Medication Samples have been provided to the patient.  Drug name: Jardiance        Strength: 10 mg        Qty: 2 boxes   LOT: ***  Exp.Date: 11/2025  Dosing instructions: ***  The patient has been instructed regarding the correct time, dose, and frequency of taking this medication, including desired effects and most common side effects.   Rachel Hahn 11:43 AM 08/29/2024

## 2024-08-29 NOTE — Progress Notes (Signed)
 Established Patient Office Visit  Subjective:  Patient ID: Rachel Hahn, female    DOB: 12/05/1956  Age: 68 y.o. MRN: [id number]  CC:  Chief Complaint  Patient presents with   Acute Visit    Follow up on labs   Discussed the use of a AI scribe software for clinical note transcription with the patient, who gave verbal consent to proceed.  HPI Rachel Hahn is a 68 year old female with chronic kidney disease who presents for discussion of lab results.  She is concerned about her kidney function due to a decrease in her GFR from 39 to 46. Her hemoglobin A1c is 7, but she has no symptoms of diabetes and has never been diagnosed with it. She is not taking NSAIDs. Her medications include Plavix  75 mg and Flexeril as needed.  She has high blood pressure and proteinuria. Her father had diabetes, and she was previously warned about the risk of diabetes if she did not manage her weight. Recently, she has been consuming a lot of sugar.  She quit smoking cold [place]. She has a history of blood clots but currently has no numbness or tingling in her feet. 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/2021    Past Surgical History:  Procedure Laterality Date   AORTIC INTERVENTION N/A 06/08/2022    Procedure: AORTIC INTERVENTION;  Surgeon: Marea Selinda RAMAN, MD;  Location: [facility];  Service: Cardiovascular;  Laterality: N/A;   CYSTOSCOPY WITH HOLMIUM LASER LITHOTRIPSY Bilateral    ELBOW ARTHROPLASTY Right 12/29/2021   radial tunnel release   KIDNEY CYST REMOVAL     LITHOTRIPSY     REVERSE SHOULDER ARTHROPLASTY Right 10/03/2023   Procedure: Right Reverse Total shoulder arthroplasty, biceps tenodesis;  Surgeon: Tobie Priest, MD;  Location: [facility] ORS;  Service: Orthopedics;  Laterality: Right;   STRABISMUS SURGERY Bilateral    TONSILLECTOMY     TOTAL SHOULDER REVISION Right 04/09/2024   Procedure: Right Shoulder greater tuberosity excision;  Surgeon: Tobie Priest, MD;  Location: [facility] ORS;  Service: Orthopedics;  Laterality: Right;  Right shoulder bony excision of greater tuberosity with possible revision shoulder arthroplasty    Family History  Problem Relation Age of Onset   Hypertension Mother    COPD Father    Diabetes Father    Hypertension Father    Hypertension Other     Social History   Socioeconomic History   Marital status: Single    Spouse name: Not on file   Number of children: 0   Years of education: Not on file   Highest education level: Not on file  Occupational History   Not on file  Tobacco  Use   Smoking status: Former    Current packs/day: 1.00    Average packs/day: 1 pack/day for 20.0 years (20.0 ttl pk-yrs)    Types: Cigarettes   Smokeless tobacco: Never  Vaping Use   Vaping status: Never Used  Substance and Sexual Activity   Alcohol use: No   Drug use: No   Sexual activity: Not Currently  Other Topics Concern   Not on file  Social History Narrative   Lives with friend   Social Drivers of Health   Financial Resource Strain: Low Risk  (09/05/2024)   Received [facility]   Overall Financial Resource Strain (CARDIA)    Difficulty of Paying Living Expenses: Not very hard  Food Insecurity: No Food Insecurity  (09/05/2024)   Received from [facility]   Hunger Vital Sign    Within the past 12 months, you worried that your food would run out before you got the money to buy more.: Never true    Within the past 12 months, the food you bought just didn't last and you didn't have money to get more.: Never true  Transportation Needs: No Transportation Needs (09/05/2024)   Received from [facility] - Transportation    In the past 12 months, has lack of transportation kept you from medical appointments or from getting medications?: No    Lack of Transportation (Non-Medical): No  Physical Activity: Not on file  Stress: Not on file  Social Connections: Not on file  Intimate Partner Violence: Not on file     Outpatient Medications Prior to Visit  Medication Sig Dispense Refill   acetaminophen  (TYLENOL ) 500 MG tablet Take 2 tablets (1,000 mg total) by mouth every 8 (eight) hours. 90 tablet 2   albuterol  (PROVENTIL ) (2.5 MG/3ML) 0.083% nebulizer solution Take 2.5 mg by nebulization every 6 (six) hours as needed for wheezing or shortness of breath.     atenolol  (TENORMIN ) 100 MG tablet Take 1 tablet by mouth once daily 30 tablet 1   budeson-glycopyrrolate -formoterol  (BREZTRI AEROSPHERE) 160-9-4.8 MCG/ACT AERO inhaler Inhale 2 puffs into the lungs 2 (two) times daily.     clopidogrel  (PLAVIX ) 75 MG tablet Take 1 tablet by mouth once daily 30 tablet 1   cyclobenzaprine (FLEXERIL) 10 MG tablet Take 10 mg by mouth 3 (three) times daily.     ondansetron  (ZOFRAN ) 4 MG tablet Take 1 tablet (4 mg total) by mouth every 6 (six) hours as needed for nausea. 20 tablet 0   ondansetron  (ZOFRAN -ODT) 4 MG disintegrating tablet Take 1 tablet (4 mg total) by mouth every 8 (eight) hours as needed for nausea or vomiting. 20 tablet 0   oxyCODONE  (ROXICODONE ) 5 MG immediate release tablet Take 1-2 tablets (5-10 mg total) by mouth every 4 (four) hours as needed (pain). 30 tablet 0    pantoprazole  (PROTONIX ) 40 MG tablet Take 1 tablet by mouth twice daily 180 tablet 0   rosuvastatin  (CRESTOR ) 40 MG tablet Take 1 tablet by mouth once daily 60 tablet 0   rosuvastatin  (CRESTOR ) 40 MG tablet Take 1 tablet (40 mg total) by mouth daily. 60 tablet 0   sertraline  (ZOLOFT ) 100 MG tablet Take 1 tablet by mouth once daily 30 tablet 0   No facility-administered medications prior to visit.    Allergies  Allergen Reactions   Ibuprofen Shortness Of Breath    Shortness of breath/chest pain   Nitrofurantoin Hives   Nsaids Shortness Of Breath  Prednisone Other (See Comments)    Nervous,jittery, she can tolerate Symbicort.   Codeine Nausea And Vomiting   Morphine  And Codeine Nausea And Vomiting   Sudafed [Pseudoephedrine Hcl] Other (See Comments)    Chest pain/shortness of breath   Sulfa Antibiotics Nausea Only    ROS Review of Systems Negative unless indicated in HPI.    Objective:    Physical Exam  BP 122/80   Pulse 91   Temp 98.7 F (37.1 C)   Ht 5' 1 (1.549 m)   Wt 167 lb 9.6 oz (76 kg)   SpO2 96%   BMI 31.67 kg/m  Wt Readings from Last 3 Encounters:  08/29/24 167 lb 9.6 oz (76 kg)  08/22/24 167 lb 6.4 oz (75.9 kg)  04/09/24 160 lb 0.9 oz (72.6 kg)     Health Maintenance  Topic Date Due   FOOT EXAM  Never done   OPHTHALMOLOGY EXAM  Never done   COVID-19 Vaccine (3 - Pfizer risk series) 06/24/2020   Medicare Annual Wellness (AWV)  09/21/2024 (Originally 09/24/2023)   Zoster Vaccines- Shingrix (1 of 2) 11/21/2024 (Originally 08/11/1976)   Mammogram  02/19/2025 (Originally 08/11/1997)   Influenza Vaccine  03/18/2025 (Originally 07/19/2024)   Lung Cancer Screening  08/22/2025 (Originally 05/17/2018)   DTaP/Tdap/Td (1 - Tdap) 08/22/2025 (Originally 08/11/1976)   Pneumococcal Vaccine: 50+ Years (1 of 2 - PCV) 08/22/2025 (Originally 08/11/1976)   Colonoscopy  08/22/2025 (Originally 08/11/2002)   Hepatitis C Screening  08/22/2025 (Originally 08/12/1975)   HEMOGLOBIN  A1C  02/19/2025   Diabetic kidney evaluation - eGFR measurement  08/22/2025   Diabetic kidney evaluation - Urine ACR  08/22/2025   DEXA SCAN  Completed   HPV VACCINES  Aged Out   Meningococcal B Vaccine  Aged Out    There are no preventive care reminders to display for this patient.  Lab Results  Component Value Date   TSH 1.14 08/22/2024   Lab Results  Component Value Date   WBC 8.1 08/22/2024   HGB 13.3 08/22/2024   HCT 41.8 08/22/2024   MCV 83.3 08/22/2024   PLT 262.0 08/22/2024   Lab Results  Component Value Date   NA 140 08/22/2024   K 3.7 08/22/2024   CO2 27 08/22/2024   GLUCOSE 134 (H) 08/22/2024   BUN 17 08/22/2024   CREATININE 1.49 (H) 08/22/2024   BILITOT 0.3 08/22/2024   ALKPHOS 109 08/22/2024   AST 13 08/22/2024   ALT 9 08/22/2024   PROT 6.9 08/22/2024   ALBUMIN  4.1 08/22/2024   CALCIUM  8.9 08/22/2024   ANIONGAP 11 04/09/2024   GFR 36.25 (L) 08/22/2024   Lab Results  Component Value Date   CHOL 147 08/22/2024   Lab Results  Component Value Date   HDL 45.00 08/22/2024   Lab Results  Component Value Date   LDLCALC 56 08/22/2024   Lab Results  Component Value Date   TRIG 232.0 (H) 08/22/2024   Lab Results  Component Value Date   CHOLHDL 3 08/22/2024   Lab Results  Component Value Date   HGBA1C 7.0 (H) 08/22/2024      Assessment & Plan:  Newly diagnosed diabetes (HCC) Assessment & Plan: New diagnosis with A1c of 7.0, no prior symptoms, aware of family history and dietary contributions. - Start Jardiance  for blood sugar management and kidney protection. - Advise increased water  intake. - Discuss dietary modifications to reduce sugar intake.  Orders: -     Empagliflozin ; Take 1 tablet (10 mg total) by mouth  every morning.  Dispense: 90 tablet; Refill: 1  Stage 3b chronic kidney disease (HCC) Assessment & Plan: GFR 36.25 and creatinine 1.49 - Refer to nephrology for evaluation and management. - Advise increased fluid intake. -  Recommend dietary modifications: more fruits and vegetables, less processed foods. - Avoid NSAIDs and nephrotoxic medications. - Start Jardiance  for kidney protection and diabetes management.  Orders: -     Empagliflozin ; Take 1 tablet (10 mg total) by mouth every morning.  Dispense: 90 tablet; Refill: 1 -     Ambulatory referral to Nephrology  Urine test positive for microalbuminuria -     Ambulatory referral to Nephrology    Follow-up: No follow-ups on file.   Citlally Captain, NP

## 2024-09-04 DIAGNOSIS — G8929 Other chronic pain: Secondary | ICD-10-CM | POA: Diagnosis not present

## 2024-09-04 DIAGNOSIS — M25511 Pain in right shoulder: Secondary | ICD-10-CM | POA: Diagnosis not present

## 2024-09-04 DIAGNOSIS — M25611 Stiffness of right shoulder, not elsewhere classified: Secondary | ICD-10-CM | POA: Diagnosis not present

## 2024-09-04 DIAGNOSIS — Z96611 Presence of right artificial shoulder joint: Secondary | ICD-10-CM | POA: Diagnosis not present

## 2024-09-04 DIAGNOSIS — S42201S Unspecified fracture of upper end of right humerus, sequela: Secondary | ICD-10-CM | POA: Diagnosis not present

## 2024-09-04 DIAGNOSIS — M6281 Muscle weakness (generalized): Secondary | ICD-10-CM | POA: Diagnosis not present

## 2024-09-05 DIAGNOSIS — S42201D Unspecified fracture of upper end of right humerus, subsequent encounter for fracture with routine healing: Secondary | ICD-10-CM | POA: Diagnosis not present

## 2024-09-08 ENCOUNTER — Emergency Department

## 2024-09-08 ENCOUNTER — Other Ambulatory Visit: Payer: Self-pay

## 2024-09-08 ENCOUNTER — Emergency Department
Admission: EM | Admit: 2024-09-08 | Discharge: 2024-09-08 | Disposition: A | Discharge status: Home / Self Care | Attending: Emergency Medicine | Admitting: Emergency Medicine

## 2024-09-08 DIAGNOSIS — M19011 Primary osteoarthritis, right shoulder: Secondary | ICD-10-CM | POA: Diagnosis not present

## 2024-09-08 DIAGNOSIS — W01198A Fall on same level from slipping, tripping and stumbling with subsequent striking against other object, initial encounter: Secondary | ICD-10-CM | POA: Insufficient documentation

## 2024-09-08 DIAGNOSIS — Z96611 Presence of right artificial shoulder joint: Secondary | ICD-10-CM | POA: Diagnosis not present

## 2024-09-08 DIAGNOSIS — S61411A Laceration without foreign body of right hand, initial encounter: Secondary | ICD-10-CM | POA: Insufficient documentation

## 2024-09-08 DIAGNOSIS — Z043 Encounter for examination and observation following other accident: Secondary | ICD-10-CM | POA: Diagnosis not present

## 2024-09-08 DIAGNOSIS — S0003XA Contusion of scalp, initial encounter: Secondary | ICD-10-CM | POA: Diagnosis not present

## 2024-09-08 DIAGNOSIS — M25511 Pain in right shoulder: Secondary | ICD-10-CM | POA: Diagnosis not present

## 2024-09-08 DIAGNOSIS — Z7902 Long term (current) use of antithrombotics/antiplatelets: Secondary | ICD-10-CM | POA: Insufficient documentation

## 2024-09-08 DIAGNOSIS — S61412A Laceration without foreign body of left hand, initial encounter: Secondary | ICD-10-CM | POA: Insufficient documentation

## 2024-09-08 DIAGNOSIS — N189 Chronic kidney disease, unspecified: Secondary | ICD-10-CM | POA: Diagnosis not present

## 2024-09-08 DIAGNOSIS — S0990XA Unspecified injury of head, initial encounter: Secondary | ICD-10-CM | POA: Diagnosis not present

## 2024-09-08 DIAGNOSIS — S5001XA Contusion of right elbow, initial encounter: Secondary | ICD-10-CM | POA: Diagnosis not present

## 2024-09-08 DIAGNOSIS — I129 Hypertensive chronic kidney disease with stage 1 through stage 4 chronic kidney disease, or unspecified chronic kidney disease: Secondary | ICD-10-CM | POA: Diagnosis not present

## 2024-09-08 DIAGNOSIS — G9389 Other specified disorders of brain: Secondary | ICD-10-CM | POA: Diagnosis not present

## 2024-09-08 DIAGNOSIS — S4991XA Unspecified injury of right shoulder and upper arm, initial encounter: Secondary | ICD-10-CM | POA: Diagnosis not present

## 2024-09-08 DIAGNOSIS — T148XXA Other injury of unspecified body region, initial encounter: Secondary | ICD-10-CM

## 2024-09-08 DIAGNOSIS — Z471 Aftercare following joint replacement surgery: Secondary | ICD-10-CM | POA: Diagnosis not present

## 2024-09-08 NOTE — ED Provider Notes (Signed)
   [name] Provider Note    Event Date/Time   First MD Initiated Contact with Patient 09/08/24 1551     (approximate)   History   Fall   HPI  Rachel Hahn is a 68 y.o. female  with history of GERD, hypertension, CKD, and as listed in EMR presents to the emergency department for treatment and evaluation after mechanical, non-syncopal fall.  She is on Plavix . She stepped on uneven ground causing her to fall forward.  She landed on her right shoulder and hit her head.  No loss of consciousness.  She has some skin tears to her hands and abrasion to the right elbow.     Physical Exam    Vitals:   09/08/24 1533 09/08/24 1741  BP: 108/88 118/88  Pulse: 95 88  Resp: 18 16  Temp: 98 F (36.7 C) 98.2 F (36.8 C)  SpO2: 95% 98%    General: Awake, no distress.  CV:  Good peripheral perfusion.  Resp:  Normal effort.  Abd:  No distention.  Other:  Awake, alert, oriented interacting with family and pastor.  Right shoulder pain increases with movement but no focal tenderness.  Contusion noted to the right elbow.  Skin tear noted to each hand.   ED Results / Procedures / Treatments   Labs (all labs ordered are listed, but only abnormal results are displayed)  Labs Reviewed - No data to display   EKG  Not indicated   RADIOLOGY  Image and radiology report reviewed and interpreted by me. Radiology report consistent with the same.  CT images of the head and cervical spine are negative for acute concerns.  PROCEDURES:  Critical Care performed: No  Procedures   MEDICATIONS ORDERED IN ED:  Medications - No data to display   IMPRESSION / MDM / ASSESSMENT AND PLAN / ED COURSE   I have reviewed the triage note and vital signs. Vital signs stable   Differential diagnosis includes, but is not limited to, ICH, hematoma, skin tear, shoulder strain, shoulder dislocation, humerus fracture  Patient's presentation is most consistent with acute  presentation with potential threat to life or bodily function.  68 year old female presenting to the emergency department for treatment and evaluation after mechanical, nonsyncopal fall earlier today.  She did not lose consciousness but was concerned because she hit her head and she is on Plavix .  Exam is reassuring.  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  Neuroexam today is normal. CT cervical spine without evidence of fracture. No acute bony abnormality of the right shoulder.  Results discussed with the patient who feels reassured.  Wounds were cleaned.  Home care, outpatient follow-up, and ER return precautions discussed.        FINAL CLINICAL IMPRESSION(S) / ED DIAGNOSES   Final diagnoses:  Minor head injury, initial encounter  Acute pain of right shoulder  Multiple skin tears     Rx / DC Orders   ED Discharge Orders     None        Note:  This document was prepared using Dragon voice recognition software and may include unintentional dictation errors.   Herlinda Kirk NOVAK, FNP 09/08/24 1839    Bradler, Evan K, MD 09/08/24 [phone]

## 2024-09-08 NOTE — ED Notes (Signed)
Wound cleaned and bandage placed

## 2024-09-08 NOTE — ED Triage Notes (Addendum)
 Pt comes with trip and fall. Pt denies any loc. Pt did hit head and is on thinners. Pt has skin tears on arms.  Pt has goose egg noted to forehead.

## 2024-09-08 NOTE — Discharge Instructions (Signed)
 Follow up with primary care for symptoms that are not improving over the next few days.  Return to the ER for symptoms that change, worsen, or for new concerns.

## 2024-09-15 DIAGNOSIS — J449 Chronic obstructive pulmonary disease, unspecified: Secondary | ICD-10-CM | POA: Insufficient documentation

## 2024-09-15 DIAGNOSIS — I739 Peripheral vascular disease, unspecified: Secondary | ICD-10-CM | POA: Insufficient documentation

## 2024-09-15 NOTE — Assessment & Plan Note (Signed)
 COPD previously managed with Breztri inhaler. -No reported SOB.

## 2024-09-15 NOTE — Assessment & Plan Note (Signed)
 Blood pressure well-controlled on atenolol . Discussed potential switch to kidney-protective antihypertensive if needed. - Monitor blood pressure. - Re-evaluate antihypertensive medication based on kidney function results.

## 2024-09-15 NOTE — Assessment & Plan Note (Signed)
 GERD managed with pantoprazole . Avoids spicy foods and quit smoking, aiding symptom management. - Continue pantoprazole  twice daily.

## 2024-09-15 NOTE — Assessment & Plan Note (Signed)
 Hyperlipidemia managed with Crestor  40 mg daily. No side effects reported. No recent cholesterol labs available. - Continue Crestor  40 mg daily. - Obtain recent cholesterol labs.

## 2024-09-15 NOTE — Assessment & Plan Note (Signed)
 Stable at present. -Continue Zoloft  daily.

## 2024-09-15 NOTE — Assessment & Plan Note (Signed)
 Peripheral arterial disease with stent placement. On Plavix , reports easy bruising. No recent cardiology or vascular surgery follow-up since 2023. - Continue Plavix . - Refer to vascular surgery for follow-up.

## 2024-09-15 NOTE — Assessment & Plan Note (Signed)
 Chronic kidney disease stage 3 with GFR 39. Emphasized hydration and avoiding nephrotoxic medications, including NSAIDs. - Recheck kidney function tests. - Advise increased fluid intake. - Avoid NSAIDs and nephrotoxic medications.

## 2024-09-16 NOTE — Assessment & Plan Note (Signed)
 New diagnosis with A1c of 7.0, no prior symptoms, aware of family history and dietary contributions. - Start Jardiance  for blood sugar management and kidney protection. - Advise increased water  intake. - Discuss dietary modifications to reduce sugar intake.

## 2024-09-16 NOTE — Assessment & Plan Note (Signed)
 GFR 36.25 and creatinine 1.49 - Refer to nephrology for evaluation and management. - Advise increased fluid intake. - Recommend dietary modifications: more fruits and vegetables, less processed foods. - Avoid NSAIDs and nephrotoxic medications. - Start Jardiance  for kidney protection and diabetes management.

## 2024-09-27 ENCOUNTER — Other Ambulatory Visit: Payer: Self-pay | Admitting: Internal Medicine

## 2024-10-10 ENCOUNTER — Other Ambulatory Visit: Payer: Self-pay | Admitting: Nephrology

## 2024-10-10 DIAGNOSIS — E785 Hyperlipidemia, unspecified: Secondary | ICD-10-CM | POA: Diagnosis not present

## 2024-10-10 DIAGNOSIS — N1832 Chronic kidney disease, stage 3b: Secondary | ICD-10-CM | POA: Diagnosis not present

## 2024-10-10 DIAGNOSIS — E1122 Type 2 diabetes mellitus with diabetic chronic kidney disease: Secondary | ICD-10-CM | POA: Diagnosis not present

## 2024-10-10 DIAGNOSIS — R8290 Unspecified abnormal findings in urine: Secondary | ICD-10-CM

## 2024-10-10 DIAGNOSIS — R809 Proteinuria, unspecified: Secondary | ICD-10-CM | POA: Diagnosis not present

## 2024-10-10 DIAGNOSIS — R319 Hematuria, unspecified: Secondary | ICD-10-CM

## 2024-10-10 DIAGNOSIS — Z1159 Encounter for screening for other viral diseases: Secondary | ICD-10-CM | POA: Diagnosis not present

## 2024-10-10 DIAGNOSIS — I129 Hypertensive chronic kidney disease with stage 1 through stage 4 chronic kidney disease, or unspecified chronic kidney disease: Secondary | ICD-10-CM | POA: Diagnosis not present

## 2024-10-15 ENCOUNTER — Other Ambulatory Visit: Payer: Self-pay | Admitting: Nurse Practitioner

## 2024-10-15 NOTE — Telephone Encounter (Unsigned)
 Copied from CRM [id number]. Topic: Clinical - Medication Refill >> Oct 15, 2024  4:41 PM Rachel Hahn wrote: Medication: pantoprazole  (PROTONIX ) 40 MG tablet  Has the patient contacted their pharmacy? Yes (Agent: If no, request that the patient contact the pharmacy for the refill. If patient does not wish to contact the pharmacy document the reason why and proceed with request.) (Agent: If yes, when and what did the pharmacy advise?)  This is the patient's preferred pharmacy:  [facility] [address] ([address]), [place] - [address] [place]) [place] [address] Phone: [phone] Fax: [phone]  Is this the correct pharmacy for this prescription? Yes If no, delete pharmacy and type the correct one.   Has the prescription been filled recently? yes  Is the patient out of the medication? Yes  Has the patient been seen for an appointment in the last year OR does the patient have an upcoming appointment? Yes  Can we respond through MyChart? No  Agent: Please be advised that Rx refills may take up to 3 business days. We ask that you follow-up with your pharmacy.

## 2024-10-17 MED ORDER — PANTOPRAZOLE SODIUM 40 MG PO TBEC: 40.0000 mg | DELAYED_RELEASE_TABLET | Freq: Two times a day (BID) | ORAL | 0 refills | Status: DC

## 2024-10-17 NOTE — Telephone Encounter (Signed)
 Refill.

## 2024-10-19 ENCOUNTER — Other Ambulatory Visit: Payer: Self-pay | Admitting: Internal Medicine

## 2024-10-19 DIAGNOSIS — F3289 Other specified depressive episodes: Secondary | ICD-10-CM

## 2024-10-21 ENCOUNTER — Ambulatory Visit
Admission: RE | Admit: 2024-10-21 | Discharge: 2024-10-21 | Disposition: A | Discharge status: Home / Self Care | Source: Ambulatory Visit | Attending: Nephrology | Admitting: Nephrology

## 2024-10-21 DIAGNOSIS — R319 Hematuria, unspecified: Secondary | ICD-10-CM | POA: Insufficient documentation

## 2024-10-21 DIAGNOSIS — N1832 Chronic kidney disease, stage 3b: Secondary | ICD-10-CM | POA: Insufficient documentation

## 2024-10-21 DIAGNOSIS — N281 Cyst of kidney, acquired: Secondary | ICD-10-CM | POA: Diagnosis not present

## 2024-10-21 DIAGNOSIS — R809 Proteinuria, unspecified: Secondary | ICD-10-CM | POA: Diagnosis not present

## 2024-10-21 DIAGNOSIS — N189 Chronic kidney disease, unspecified: Secondary | ICD-10-CM | POA: Diagnosis not present

## 2024-10-21 DIAGNOSIS — R8290 Unspecified abnormal findings in urine: Secondary | ICD-10-CM | POA: Diagnosis not present

## 2024-11-01 ENCOUNTER — Other Ambulatory Visit: Payer: Self-pay | Admitting: Nephrology

## 2024-11-01 DIAGNOSIS — N2889 Other specified disorders of kidney and ureter: Secondary | ICD-10-CM

## 2024-11-01 DIAGNOSIS — N1832 Chronic kidney disease, stage 3b: Secondary | ICD-10-CM

## 2024-11-01 DIAGNOSIS — N289 Disorder of kidney and ureter, unspecified: Secondary | ICD-10-CM

## 2024-11-04 ENCOUNTER — Other Ambulatory Visit: Payer: Self-pay | Admitting: Internal Medicine

## 2024-11-04 ENCOUNTER — Other Ambulatory Visit: Payer: Self-pay | Admitting: Nurse Practitioner

## 2024-11-04 DIAGNOSIS — I779 Disorder of arteries and arterioles, unspecified: Secondary | ICD-10-CM

## 2024-11-04 NOTE — Telephone Encounter (Signed)
 Copied from CRM #[id number]. Topic: Clinical - Medication Refill >> Nov 04, 2024 12:07 PM Macario HERO wrote: Medication: rosuvastatin  (CRESTOR ) 40 MG tablet [[id number]]  Has the patient contacted their pharmacy? No (Agent: If no, request that the patient contact the pharmacy for the refill. If patient does not wish to contact the pharmacy document the reason why and proceed with request.) (Agent: If yes, when and what did the pharmacy advise?)  This is the patient's preferred pharmacy:  [facility] [address] ([address]), [place] - [address] [place]) [place] [address] Phone: [phone] Fax: [phone]  Is this the correct pharmacy for this prescription? Yes If no, delete pharmacy and type the correct one.   Has the prescription been filled recently? Yes  Is the patient out of the medication? Yes  Has the patient been seen for an appointment in the last year OR does the patient have an upcoming appointment? Yes  Can we respond through MyChart? No  Agent: Please be advised that Rx refills may take up to 3 business days. We ask that you follow-up with your pharmacy.

## 2024-11-05 MED ORDER — ROSUVASTATIN CALCIUM 40 MG PO TABS: 40.0000 mg | ORAL_TABLET | Freq: Every day | ORAL | 1 refills | Status: AC

## 2024-11-11 ENCOUNTER — Ambulatory Visit
Admission: RE | Admit: 2024-11-11 | Discharge: 2024-11-11 | Disposition: A | Discharge status: Home / Self Care | Source: Ambulatory Visit | Attending: Nephrology | Admitting: Nephrology

## 2024-11-11 DIAGNOSIS — K5750 Diverticulosis of both small and large intestine without perforation or abscess without bleeding: Secondary | ICD-10-CM | POA: Diagnosis not present

## 2024-11-11 DIAGNOSIS — N2889 Other specified disorders of kidney and ureter: Secondary | ICD-10-CM | POA: Insufficient documentation

## 2024-11-11 DIAGNOSIS — N281 Cyst of kidney, acquired: Secondary | ICD-10-CM | POA: Diagnosis not present

## 2024-11-11 DIAGNOSIS — N1832 Chronic kidney disease, stage 3b: Secondary | ICD-10-CM | POA: Diagnosis not present

## 2024-11-11 DIAGNOSIS — N289 Disorder of kidney and ureter, unspecified: Secondary | ICD-10-CM | POA: Diagnosis not present

## 2024-11-11 DIAGNOSIS — N202 Calculus of kidney with calculus of ureter: Secondary | ICD-10-CM | POA: Diagnosis not present

## 2024-11-11 LAB — POCT I-STAT CREATININE: Creatinine, Ser: 1.7 mg/dL — ABNORMAL HIGH (ref 0.44–1.00)

## 2024-11-11 MED ORDER — IOHEXOL 300 MG/ML  SOLN
100.0000 mL | Freq: Once | INTRAMUSCULAR | Status: AC | PRN
  Administered 2024-11-11: 75 mL via INTRAVENOUS

## 2024-11-13 ENCOUNTER — Telehealth: Payer: Self-pay

## 2024-11-13 NOTE — Telephone Encounter (Signed)
 I left a voicemail for patient asking her to please call us  at [phone].  I also sent a message to patient via MyChart.  E2C2 - when patient calls back, please let her know that Chelsea Aurora, NP, will be out of the office on 12/27/2024, so we need to reschedule her appointment.  Please assist patient with rescheduling her appointment.

## 2024-11-18 DIAGNOSIS — I129 Hypertensive chronic kidney disease with stage 1 through stage 4 chronic kidney disease, or unspecified chronic kidney disease: Secondary | ICD-10-CM | POA: Diagnosis not present

## 2024-11-18 DIAGNOSIS — E785 Hyperlipidemia, unspecified: Secondary | ICD-10-CM | POA: Diagnosis not present

## 2024-11-18 DIAGNOSIS — R809 Proteinuria, unspecified: Secondary | ICD-10-CM | POA: Diagnosis not present

## 2024-11-18 DIAGNOSIS — N1832 Chronic kidney disease, stage 3b: Secondary | ICD-10-CM | POA: Diagnosis not present

## 2024-11-18 DIAGNOSIS — R319 Hematuria, unspecified: Secondary | ICD-10-CM | POA: Diagnosis not present

## 2024-11-18 DIAGNOSIS — N200 Calculus of kidney: Secondary | ICD-10-CM | POA: Diagnosis not present

## 2024-11-18 DIAGNOSIS — Z7984 Long term (current) use of oral hypoglycemic drugs: Secondary | ICD-10-CM | POA: Diagnosis not present

## 2024-11-18 DIAGNOSIS — E1122 Type 2 diabetes mellitus with diabetic chronic kidney disease: Secondary | ICD-10-CM | POA: Diagnosis not present

## 2024-12-04 ENCOUNTER — Ambulatory Visit: Admitting: Urology

## 2024-12-25 ENCOUNTER — Other Ambulatory Visit: Payer: Self-pay | Admitting: Nurse Practitioner

## 2024-12-25 DIAGNOSIS — I779 Disorder of arteries and arterioles, unspecified: Secondary | ICD-10-CM

## 2024-12-25 MED ORDER — CLOPIDOGREL BISULFATE 75 MG PO TABS: 75.0000 mg | ORAL_TABLET | Freq: Every day | ORAL | 1 refills | Status: AC

## 2024-12-25 NOTE — Telephone Encounter (Signed)
 Copied from CRM [id number]. Topic: Clinical - Prescription Issue >> Dec 25, 2024 10:14 AM Robinson H wrote: Reason for CRM: Patient calling to refill her clopidogrel  (PLAVIX ) 75 MG tablet, refill crm was created on 12/18 but agent not finding refill request sent to office or messages pertaining to request. Last refill was done by previous provider, patient now see's Chelsea Aurora, patient has been out since December 2025  Iesha [phone]

## 2024-12-27 ENCOUNTER — Ambulatory Visit: Admitting: Nurse Practitioner

## 2025-01-02 ENCOUNTER — Ambulatory Visit: Admitting: Urology

## 2025-01-02 DIAGNOSIS — N200 Calculus of kidney: Secondary | ICD-10-CM

## 2025-01-09 ENCOUNTER — Ambulatory Visit: Admitting: Nurse Practitioner

## 2025-01-09 DIAGNOSIS — I10 Essential (primary) hypertension: Secondary | ICD-10-CM

## 2025-01-09 DIAGNOSIS — E119 Type 2 diabetes mellitus without complications: Secondary | ICD-10-CM

## 2025-01-09 DIAGNOSIS — N1832 Chronic kidney disease, stage 3b: Secondary | ICD-10-CM

## 2025-01-10 NOTE — Progress Notes (Unsigned)
 "   GYNECOLOGY PROGRESS NOTE  Subjective:  PCP: Vincente Saber, NP  Patient ID: Rachel Hahn, female    DOB: [birth year], 69 y.o.   MRN: [id number]  HPI  Patient is a 69 y.o. No obstetric history on file. female who presents for   {Common ambulatory SmartLinks:19316}  Review of Systems {ros; complete:30496}   Objective:   There were no vitals taken for this visit. There is no height or weight on file to calculate BMI.  General appearance: {general exam:16600} Abdomen: {abdominal exam:16834} Pelvic: {pelvic exam:16852::cervix normal in appearance,external genitalia normal,no adnexal masses or tenderness,no cervical motion tenderness,rectovaginal septum normal,uterus normal size, shape, and consistency,vagina normal without discharge} Extremities: {extremity exam:5109} Neurologic: {neuro exam:17854}  CT Scan 11/11/24 EXAM: CT ABDOMEN AND PELVIS WITHOUT AND WITH CONTRAST   TECHNIQUE: Multidetector CT imaging of the abdomen and pelvis was performed following the standard protocol before and following the bolus administration of intravenous contrast.   RADIATION DOSE REDUCTION: This exam was performed according to the departmental dose-optimization program which includes automated exposure control, adjustment of the mA and/or kV according to patient size and/or use of iterative reconstruction technique.   CONTRAST:  75mL OMNIPAQUE  IOHEXOL  300 MG/ML  SOLN   COMPARISON:  Renal ultrasound dated 10/21/2024, CTA abdomen and pelvis dated 05/17/2017   FINDINGS: Lower chest: 5 mm basilar right lower lobe nodule (3:33), new from 05/17/2017. No pleural effusion or pneumothorax demonstrated. Partially imaged heart size is normal.   Hepatobiliary: Scattered subcentimeter hypodensities, too small to characterize. No intra or extrahepatic biliary ductal dilation. Normal gallbladder.   Pancreas: No focal lesions or main ductal dilation.   Spleen: Normal in size  without focal abnormality.   Adrenals/Urinary Tract: Increased size of 1.3 cm left adrenal nodule, previously 0.7 cm, measuring -10 HU, likely adenoma. No specific follow-up imaging recommended. No right adrenal nodule. Multifocal bilateral renal cortical scarring. Three proximal right ureteral stones measuring up to 4 mm. Left ureteropelvic junction stone measures 7 mm. No hydronephrosis. Mild soft tissue stranding surrounding the proximal right ureter. Additional bilateral nonobstructing stones measuring 5 mm in the right lower pole and measuring up to 4 mm in the left lower pole. No suspicious renal masses. Bilateral simple cortical and parapelvic cysts. No focal bladder wall thickening.   Stomach/Bowel: Normal appearance of the stomach. Small duodenal diverticula arising from the third portion. 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 No evidence of bowel wall thickening, distention, or inflammatory changes. Cecum is normally located in the right lower quadrant. Colonic diverticulosis without acute diverticulitis. Normal appendix.   Vascular/Lymphatic: Aortic atherosclerosis. Postsurgical changes of infrarenal abdominal aorta and bilateral common iliac arteries. Stent graft appears patent. No enlarged abdominal or pelvic lymph nodes.   Reproductive: No adnexal masses. Endometrium is mildly thickened measuring 9 mm.   Other: No free fluid, fluid collection, or free air.   Musculoskeletal: No acute or abnormal lytic or blastic osseous lesions. Small fat-containing bilateral inguinal hernias.   IMPRESSION: 1. No suspicious renal masses. Bilateral simple cortical and parapelvic cysts. 2. Three proximal right ureteral stones measuring up to 4 mm with surrounding mild mesenteric stranding, likely reactive. Left ureteropelvic junction stone measures 7 mm. No hydronephrosis. 3. Additional bilateral nonobstructing stones measuring up to 5 mm in  the right lower pole and measuring up to 4 mm in the left lower pole. 4. Endometrium is mildly thickened measuring 9 mm. Recommend further evaluation with pelvic ultrasound examination and endometrial biopsy, as clinically indicated. 5. A 5 mm  basilar right lower lobe nodule, new from 05/17/2017. No follow-up needed if patient is low-risk.This recommendation follows the consensus statement: Guidelines for Management of Incidental Pulmonary Nodules Detected on CT Images: From the Fleischner Society 2017; Radiology 2017; 284:228-243. 6.  Aortic Atherosclerosis (ICD10-I70.0).   These results will be called to the ordering clinician or representative by the Radiologist Assistant, and communication documented in the PACS or [employer].    Assessment/Plan:   No diagnosis found.   There are no diagnoses linked to this encounter.     Estil Mangle, DO [facility] "

## 2025-01-15 ENCOUNTER — Encounter: Admitting: Obstetrics

## 2025-01-15 ENCOUNTER — Other Ambulatory Visit: Payer: Self-pay | Admitting: Nurse Practitioner

## 2025-01-16 ENCOUNTER — Other Ambulatory Visit: Payer: Self-pay | Admitting: Internal Medicine

## 2025-01-16 DIAGNOSIS — F3289 Other specified depressive episodes: Secondary | ICD-10-CM

## 2025-01-21 ENCOUNTER — Telehealth: Payer: Self-pay | Admitting: *Deleted

## 2025-01-21 ENCOUNTER — Ambulatory Visit

## 2025-01-21 NOTE — Telephone Encounter (Signed)
" °  Patient was scheduled AWV today 01/21/25 which appointment was cancelled. Patient has rescheduled AWV 04/01/25. Patient's last office visit with you was 08/29/24. Patient does not have a follow-up appointment scheduled with you. Please let the front office know when she should schedule a follow-up appointment with you. Thanks "

## 2025-01-22 NOTE — Telephone Encounter (Signed)
 Left a message to call the office back to schedule a follow up due to her missing her follow up on 01/09/25.

## 2025-01-23 NOTE — Telephone Encounter (Signed)
 Left message to call the office for a follow up appointment due to missing her 01/09/25 appointment per [facility].

## 2025-04-01 ENCOUNTER — Ambulatory Visit
